# Patient Record
Sex: Male | Born: 1935 | Race: White | Hispanic: No | Marital: Married | Smoking: Former smoker
Health system: Southern US, Community
[De-identification: ages and names within clinical notes are randomized; demographics above are authoritative.]

## PROBLEM LIST (undated history)

## (undated) DIAGNOSIS — I10 Essential (primary) hypertension: Secondary | ICD-10-CM

## (undated) DIAGNOSIS — I2510 Atherosclerotic heart disease of native coronary artery without angina pectoris: Secondary | ICD-10-CM

## (undated) DIAGNOSIS — R0600 Dyspnea, unspecified: Secondary | ICD-10-CM

## (undated) DIAGNOSIS — K219 Gastro-esophageal reflux disease without esophagitis: Secondary | ICD-10-CM

## (undated) DIAGNOSIS — T451X5A Adverse effect of antineoplastic and immunosuppressive drugs, initial encounter: Secondary | ICD-10-CM

## (undated) DIAGNOSIS — R918 Other nonspecific abnormal finding of lung field: Secondary | ICD-10-CM

## (undated) DIAGNOSIS — I739 Peripheral vascular disease, unspecified: Secondary | ICD-10-CM

## (undated) DIAGNOSIS — Z950 Presence of cardiac pacemaker: Secondary | ICD-10-CM

## (undated) DIAGNOSIS — E785 Hyperlipidemia, unspecified: Secondary | ICD-10-CM

## (undated) DIAGNOSIS — F0390 Unspecified dementia without behavioral disturbance: Secondary | ICD-10-CM

## (undated) DIAGNOSIS — N182 Chronic kidney disease, stage 2 (mild): Secondary | ICD-10-CM

## (undated) DIAGNOSIS — I219 Acute myocardial infarction, unspecified: Secondary | ICD-10-CM

## (undated) DIAGNOSIS — M4800 Spinal stenosis, site unspecified: Secondary | ICD-10-CM

## (undated) DIAGNOSIS — I447 Left bundle-branch block, unspecified: Secondary | ICD-10-CM

## (undated) DIAGNOSIS — N400 Enlarged prostate without lower urinary tract symptoms: Secondary | ICD-10-CM

## (undated) DIAGNOSIS — I779 Disorder of arteries and arterioles, unspecified: Secondary | ICD-10-CM

## (undated) DIAGNOSIS — IMO0002 Reserved for concepts with insufficient information to code with codable children: Secondary | ICD-10-CM

## (undated) DIAGNOSIS — Z95810 Presence of automatic (implantable) cardiac defibrillator: Secondary | ICD-10-CM

## (undated) DIAGNOSIS — R112 Nausea with vomiting, unspecified: Secondary | ICD-10-CM

## (undated) DIAGNOSIS — C8510 Unspecified B-cell lymphoma, unspecified site: Secondary | ICD-10-CM

## (undated) DIAGNOSIS — I255 Ischemic cardiomyopathy: Secondary | ICD-10-CM

## (undated) DIAGNOSIS — I639 Cerebral infarction, unspecified: Secondary | ICD-10-CM

## (undated) DIAGNOSIS — G4733 Obstructive sleep apnea (adult) (pediatric): Secondary | ICD-10-CM

## (undated) DIAGNOSIS — I5022 Chronic systolic (congestive) heart failure: Secondary | ICD-10-CM

## (undated) DIAGNOSIS — K5790 Diverticulosis of intestine, part unspecified, without perforation or abscess without bleeding: Secondary | ICD-10-CM

## (undated) HISTORY — DX: Left bundle-branch block, unspecified: I44.7

## (undated) HISTORY — DX: Disorder of arteries and arterioles, unspecified: I77.9

## (undated) HISTORY — DX: Diverticulosis of intestine, part unspecified, without perforation or abscess without bleeding: K57.90

## (undated) HISTORY — PX: CHOLECYSTECTOMY: SHX55

## (undated) HISTORY — DX: Reserved for concepts with insufficient information to code with codable children: IMO0002

## (undated) HISTORY — DX: Chronic kidney disease, stage 2 (mild): N18.2

## (undated) HISTORY — DX: Peripheral vascular disease, unspecified: I73.9

## (undated) HISTORY — DX: Ischemic cardiomyopathy: I25.5

## (undated) HISTORY — DX: Nausea with vomiting, unspecified: R11.2

## (undated) HISTORY — PX: CARDIAC CATHETERIZATION: SHX172

## (undated) HISTORY — DX: Adverse effect of antineoplastic and immunosuppressive drugs, initial encounter: T45.1X5A

## (undated) HISTORY — DX: Benign prostatic hyperplasia without lower urinary tract symptoms: N40.0

## (undated) HISTORY — DX: Obstructive sleep apnea (adult) (pediatric): G47.33

## (undated) HISTORY — DX: Unspecified B-cell lymphoma, unspecified site: C85.10

## (undated) HISTORY — PX: CAROTID ENDARTERECTOMY: SUR193

## (undated) HISTORY — PX: URETHRAL STRICTURE DILATATION: SHX477

## (undated) HISTORY — PX: TONSILLECTOMY: SUR1361

## (undated) HISTORY — DX: Chronic systolic (congestive) heart failure: I50.22

---

## 1962-04-11 HISTORY — PX: APPENDECTOMY: SHX54

## 1990-04-11 HISTORY — PX: AORTA - BILATERAL FEMORAL ARTERY BYPASS GRAFT: SHX1175

## 1998-11-19 ENCOUNTER — Encounter (INDEPENDENT_AMBULATORY_CARE_PROVIDER_SITE_OTHER): Payer: Self-pay | Admitting: Specialist

## 1998-11-19 ENCOUNTER — Ambulatory Visit (HOSPITAL_COMMUNITY): Admission: RE | Admit: 1998-11-19 | Discharge: 1998-11-19 | Payer: Self-pay | Admitting: Gastroenterology

## 1999-01-21 ENCOUNTER — Encounter (INDEPENDENT_AMBULATORY_CARE_PROVIDER_SITE_OTHER): Payer: Self-pay | Admitting: Specialist

## 1999-01-21 ENCOUNTER — Other Ambulatory Visit: Admission: RE | Admit: 1999-01-21 | Discharge: 1999-01-21 | Payer: Self-pay | Admitting: Gastroenterology

## 2000-10-03 ENCOUNTER — Encounter: Payer: Self-pay | Admitting: Gastroenterology

## 2000-10-03 ENCOUNTER — Encounter: Admission: RE | Admit: 2000-10-03 | Discharge: 2000-10-03 | Payer: Self-pay | Admitting: Gastroenterology

## 2004-11-03 ENCOUNTER — Ambulatory Visit (HOSPITAL_COMMUNITY): Admission: RE | Admit: 2004-11-03 | Discharge: 2004-11-03 | Payer: Self-pay | Admitting: Vascular Surgery

## 2004-11-10 ENCOUNTER — Encounter (INDEPENDENT_AMBULATORY_CARE_PROVIDER_SITE_OTHER): Payer: Self-pay | Admitting: *Deleted

## 2004-11-10 ENCOUNTER — Inpatient Hospital Stay (HOSPITAL_COMMUNITY): Admission: RE | Admit: 2004-11-10 | Discharge: 2004-11-12 | Payer: Self-pay | Admitting: Vascular Surgery

## 2005-04-11 HISTORY — PX: COLON SURGERY: SHX602

## 2005-04-11 HISTORY — PX: CORONARY ARTERY BYPASS GRAFT: SHX141

## 2005-08-15 ENCOUNTER — Ambulatory Visit: Payer: Self-pay | Admitting: Gastroenterology

## 2005-08-26 ENCOUNTER — Ambulatory Visit: Payer: Self-pay | Admitting: Gastroenterology

## 2005-09-07 ENCOUNTER — Ambulatory Visit: Payer: Self-pay | Admitting: Gastroenterology

## 2005-11-21 ENCOUNTER — Ambulatory Visit: Payer: Self-pay | Admitting: Gastroenterology

## 2005-12-16 ENCOUNTER — Ambulatory Visit: Payer: Self-pay | Admitting: Gastroenterology

## 2006-04-11 HISTORY — PX: VENTRAL HERNIA REPAIR: SHX424

## 2006-04-11 HISTORY — PX: SKIN GRAFT: SHX250

## 2006-06-29 ENCOUNTER — Ambulatory Visit: Payer: Self-pay | Admitting: Vascular Surgery

## 2007-08-28 ENCOUNTER — Ambulatory Visit: Payer: Self-pay | Admitting: Vascular Surgery

## 2008-06-04 ENCOUNTER — Encounter (INDEPENDENT_AMBULATORY_CARE_PROVIDER_SITE_OTHER): Payer: Self-pay | Admitting: *Deleted

## 2008-06-26 ENCOUNTER — Ambulatory Visit: Payer: Self-pay | Admitting: Gastroenterology

## 2008-07-01 ENCOUNTER — Ambulatory Visit: Payer: Self-pay | Admitting: Gastroenterology

## 2008-07-01 ENCOUNTER — Encounter: Payer: Self-pay | Admitting: Gastroenterology

## 2008-07-03 ENCOUNTER — Encounter: Payer: Self-pay | Admitting: Gastroenterology

## 2008-08-27 ENCOUNTER — Ambulatory Visit: Payer: Self-pay | Admitting: Internal Medicine

## 2009-07-28 ENCOUNTER — Ambulatory Visit: Payer: Self-pay | Admitting: Vascular Surgery

## 2009-08-09 HISTORY — PX: URETHRAL MEATOPLASTY: SHX2620

## 2009-08-31 ENCOUNTER — Encounter: Payer: Self-pay | Admitting: Urology

## 2009-09-01 ENCOUNTER — Ambulatory Visit (HOSPITAL_COMMUNITY): Admission: AD | Admit: 2009-09-01 | Discharge: 2009-09-03 | Payer: Self-pay | Admitting: Urology

## 2010-02-01 ENCOUNTER — Ambulatory Visit (HOSPITAL_BASED_OUTPATIENT_CLINIC_OR_DEPARTMENT_OTHER): Admission: RE | Admit: 2010-02-01 | Discharge: 2010-02-01 | Payer: Self-pay | Admitting: Urology

## 2010-05-02 ENCOUNTER — Encounter: Payer: Self-pay | Admitting: Urology

## 2010-06-23 LAB — POCT I-STAT 4, (NA,K, GLUC, HGB,HCT)
Glucose, Bld: 121 mg/dL — ABNORMAL HIGH (ref 70–99)
HCT: 43 % (ref 39.0–52.0)
Hemoglobin: 14.6 g/dL (ref 13.0–17.0)
Potassium: 4.6 mEq/L (ref 3.5–5.1)
Sodium: 137 mEq/L (ref 135–145)

## 2010-06-28 LAB — POCT I-STAT, CHEM 8
Creatinine, Ser: 0.6 mg/dL (ref 0.4–1.5)
Glucose, Bld: 122 mg/dL — ABNORMAL HIGH (ref 70–99)
HCT: 45 % (ref 39.0–52.0)
Hemoglobin: 15.3 g/dL (ref 13.0–17.0)
Potassium: 4.4 mEq/L (ref 3.5–5.1)
Sodium: 141 mEq/L (ref 135–145)
TCO2: 27 mmol/L (ref 0–100)

## 2010-07-29 ENCOUNTER — Other Ambulatory Visit (INDEPENDENT_AMBULATORY_CARE_PROVIDER_SITE_OTHER): Payer: Medicare Other

## 2010-07-29 DIAGNOSIS — Z48812 Encounter for surgical aftercare following surgery on the circulatory system: Secondary | ICD-10-CM

## 2010-07-29 DIAGNOSIS — I6529 Occlusion and stenosis of unspecified carotid artery: Secondary | ICD-10-CM

## 2010-08-03 NOTE — Procedures (Unsigned)
CAROTID DUPLEX EXAM  INDICATION:  Follow up carotid stenosis.  HISTORY: Diabetes:  No Cardiac:  CABG, MI in 2007 Hypertension:  Yes Smoking:  Previous Previous Surgery:  Left CEA in 1992, redo left CEA 11/10/2004 CV History:  Asymptomatic Amaurosis Fugax No, Paresthesias No, Hemiparesis No                                      RIGHT             LEFT Brachial systolic pressure:         90                112 Brachial Doppler waveforms:         WNL               WNL Vertebral direction of flow:        antegrade         antegrade DUPLEX VELOCITIES (cm/sec) CCA peak systolic                   96                87 ECA peak systolic                   257               144 ICA peak systolic                   184               136 ICA end diastolic                   49                35 PLAQUE MORPHOLOGY:                  heterogeneous     heterogeneous PLAQUE AMOUNT:                      moderate          mild PLAQUE LOCATION:                    CCA, ICA, ECA     CCA, ECA  IMPRESSION: 1. Right internal carotid artery stenosis in the 40% to 59% range. 2. Left internal carotid artery patent with history of endarterectomy,     elevated velocities present which may be consistent with an     endarterectomized vessel. 3. Right external carotid artery stenosis is present. 4. Bilateral vertebral arteries are antegrade. 5. Essentially unchanged since previous study on 07/28/2009.  ___________________________________________ Gary Ferrell. Hart Rochester, M.D.  SH/MEDQ  D:  07/29/2010  T:  07/29/2010  Job:  [id number]

## 2010-08-24 NOTE — Procedures (Signed)
CAROTID DUPLEX EXAM   INDICATION:  Follow up known carotid artery disease.   HISTORY:  Diabetes:  No.  Cardiac:  CABG.  Hypertension:  Yes.  Smoking:  Quit about 9 years ago.  Previous Surgery:  Left carotid endarterectomy 2 times.  The recent 1  was in 2006.  CV History:  Amaurosis Fugax No, Paresthesias No, Hemiparesis No.                                       RIGHT             LEFT  Brachial systolic pressure:         123               127  Brachial Doppler waveforms:         Biphasic          Biphasic  Vertebral direction of flow:        Antegrade         Antegrade  DUPLEX VELOCITIES (cm/sec)  CCA peak systolic                   95                82  ECA peak systolic                   183               116  ICA peak systolic                   175               100  ICA end diastolic                   45                25  PLAQUE MORPHOLOGY:                  Heterogenous      Heterogenous  PLAQUE AMOUNT:                      Moderate          Mild  PLAQUE LOCATION:                    ICA, ECA          ICA, ECA   IMPRESSION:  1. 40% to 59% stenosis noted in the right internal carotid artery.  2. 1% to 39% stenosis noted in the left internal carotid artery.  3. Status post left carotid endarterectomy.  4. Antegrade bilateral vertebral arteries.   ___________________________________________  Quita Skye Hart Rochester, M.D.   MG/MEDQ  D:  07/28/2009  T:  07/28/2009  Job:  [id number]

## 2010-08-24 NOTE — Consult Note (Signed)
NEW PATIENT CONSULTATION   Gary, Ferrell  DOB:  03/12/1936                                       07/28/2009  TGGYI#:[id number]   The patient was referred by Dr. Patsi Sears for preoperative clearance.  He is a 75 year old male known to me from previous vascular surgery in  the past.  He underwent aortobifemoral bypass grafting by me in 1992 for  severe claudication symptoms which have not recurred.  He also had left  carotid endarterectomy in 1992 and a redo left carotid endarterectomy in  2006 with severe carotid occlusive disease which is asymptomatic.  He  has not been back to our office for followup carotid duplex since 2009  and there was no evidence of any significant restenosis.  He denies any  hemispheric or nonhemispheric TIAs, amaurosis fugax, diplopia, blurred  vision or syncope at the present time.  He needs a prostatectomy.  He  has recently been seen by Dr. Wynona Neat, his cardiologist, in [place].   CHRONIC MEDICAL PROBLEMS:  1. Coronary artery disease.  2. Aortoiliac occlusive disease.  3. Hyperlipidemia.  4. Gastroesophageal reflux disease.  5. Negative for diabetes, COPD or stroke.   FAMILY HISTORY:  Positive for coronary artery disease.  Has had PTCA and  stenting on three occasions and positive for diabetes in two brothers.   SOCIAL HISTORY:  He is married, has one child and is retired.  Has not  smoked in 21 years and does not use alcohol.   REVIEW OF SYSTEMS:  Negative for weight loss, anorexia, chest pain,  dyspnea on exertion.  No chronic bronchitis, asthma, wheezing.  Does  have constipation and urinary frequency.  No muscle or joint pain.  No  claudication.  Has occasional change in his hearing.  All other systems  in the review of systems are negative.   PHYSICAL EXAMINATION:  Vital signs:  Blood pressure 123/70, heart rate  58, respirations 14.  General:  He is a well-developed, well-nourished  male who is in no apparent  distress.  HEENT:  Exam is normal.  EOMs  intact.  Lungs:  Are clear.  No rales or wheezing.  Cardiovascular:  Regular rhythm and no murmurs.  Carotid pulses are 3+, no bruits.  Abdomen:  Soft, nontender with no masses.  Musculoskeletal:  Free of  major deformities.  Neurological:  Normal.  Skin:  Free of rashes.  Lower extremity exam reveals 3+ femoral, 2+ popliteal pulses  bilaterally.  Left foot has a PT pulse at 2+, right foot is not  palpable.  Both feet are well-perfused.   Today I ordered a carotid duplex exam since he did have a restenosis in  2006 from an original endarterectomy in 1992.  I reviewed and  interpreted this and he does have mild flow reduction in his right  internal carotid approximating 50% and the left side is minimal in the  30%-40% range.   He has no evidence of severe lower extremity occlusive disease and his  carotid surgery is doing well.  I see no reason he could not proceed  with prostatectomy.  We will follow him on an annual basis for his  carotid disease unless he develops symptoms in the interim.     Quita Skye Hart Rochester, M.D.  Electronically Signed   JDL/MEDQ  D:  07/28/2009  T:  07/29/2009  Job:  [id number] 

## 2010-08-27 NOTE — Assessment & Plan Note (Signed)
[facility]                           GASTROENTEROLOGY OFFICE NOTE   MIKAH, POSS                      MRN:          [id number]  DATE:11/21/2005                            DOB:          1935-12-01    HISTORY:  Marcelino comes in to see me after he had been recently operated on  for intestinal adhesions and lysing adhesions releasing small intestine.  Edras has known dermatitis, __________ and irritable bowel syndrome and  GERD.  He is status post cholecystectomy with known history of Crohn's, and  polyps as well.  He has known gluten neuropathy.  He has had a long history  of diarrhea and bloating and gas.  He says he has had constant diarrhea  since the surgery but actually there have been days when he did not have  much diarrhea and then he had some increased bowel activity following and so  on.  He was tested there at the hospital where he had his surgery for C.  diff and so on and nothing of significance was noted.  He was also given a  course of Flagyl without much success.  He said recent laboratory studies  were not really revealing.   PHYSICAL EXAMINATION:  VITAL SIGNS:  He has lost some weight, and he is down  to 150.6.  His blood pressure is 122/54, pulse is 68 and regular.  GENERAL:  He is white, pale, thin.  HEENT:  His oropharynx is negative.  NECK:  Negative.  HEART:  Revealed a regular rhythm without significant murmur.  CHEST:  Clear to auscultation.  ABDOMEN:  Soft.  There were no significant blood.  There were no bruits,  rubs.  It was nontender.  EXTREMITIES:  Unremarkable.  RECTAL:  Deferred.   IMPRESSION:  Irritable bowel syndrome with diarrheal component.   RECOMMENDATION:  1. He is to change his diet to a somewhat lactose free diet.  He is to      continue on his gluten free diet.  Eat six small meals daily.  2. Use Boost that is lactose free.  3. I explained to him exactly how to take this to sip it slowly over  an      hour and a half with each.  4. He needs to be on multivitamin.  I gave him some Xifaxan 300 mg to take      1 b.i.d., some Flora-Q b.i.d.  5. Lomotil, I told him he could use up to 6 a day.  I gave him a      prescription for this.  6. Ultrase to take 20 mg a.c.  7. Cholestin 2 tablets every day.   Hopefully, the above measures will be helpful to him.  I did get some labs  on him and I will see him back in 3 weeks because I am concerned about  his  overall problem.                                   Ulyess Mort,  MD   SML/MedQ  DD:  11/21/2005  DT:  11/22/2005  Job #:  [id number]

## 2010-08-27 NOTE — Consult Note (Signed)
NAMEIVIS, HENNEMAN NO.:  [id number]   MEDICAL RECORD NO.:  [id number]          PATIENT TYPE:  OIB   LOCATION:  2899                         FACILITY:  [facility]   PHYSICIAN:  Lacy Duverney, M.D.     DATE OF BIRTH:  June 17, 1935   DATE OF CONSULTATION:  11/03/2004  DATE OF DISCHARGE:                                   CONSULTATION   REASON FOR CONSULTATION:  Request by Dr. Hart Rochester for interpretation of  intracranial views from arteriogram he performed November 03, 2004.  Patient  with findings suggestive of recurrent carotid stenosis.   Right common carotid arteriogram intracranial views (AP and lateral):   Ectasia of portions of the right internal carotid artery cavernous segment  alternating with areas of mild narrowing.  This is most notable along the  superior aspect of the right internal carotid artery where on the lateral  view there is a bilobed bulbous projection inferiorly suggestive of mild  aneurysmal dilatation.  Evaluation somewhat limited by lack of oblique  views.  Stability can be confirmed on follow up.  With injection of the  right common carotid artery, no significant cross-filling occurs.  There is  mild narrowing of the proximal right middle cerebral artery inferior branch.  Call is into Dr. Hart Rochester.   Left common carotid arteriogram intracranial views (AP and lateral):   With injection of the left common carotid artery there is flash filling of  right anterior cerebral artery AZ segment vessels.  No high-grade stenosis  detected.   IMPRESSION:  Moderate atherosclerotic type changes, most notable right  internal carotid artery cavernous segment when there is aneurysmal dilation  as described above.           ______________________________  Lacy Duverney, M.D.     SO/MEDQ  D:  11/03/2004  T:  11/03/2004  Job:  [id number]   cc:   Mirian Mo, M.D.

## 2010-08-27 NOTE — Procedures (Signed)
CAROTID DUPLEX EXAM   INDICATION:  Followup of known carotid artery disease.  Patient is  currently asymptomatic.   HISTORY:  Diabetes:  No.  Cardiac:  CAD, CABG in 2007.  Hypertension:  Yes.  Smoking:  No.  Previous Surgery:  CABG and redo of left CEA with DPA on 11/10/04 by Dr.  Hart Rochester.  CV History:  Amaurosis Fugax , Paresthesias , Hemiparesis                                       RIGHT             LEFT  Brachial systolic pressure:         138               140  Brachial Doppler waveforms:         WNL               WNL  Vertebral direction of flow:        Antegrade         Antegrade  DUPLEX VELOCITIES (cm/sec)  CCA peak systolic                   84                94  ECA peak systolic                   156               157  ICA peak systolic                   137               125  ICA end diastolic                   38                34  PLAQUE MORPHOLOGY:                  Mixed             Mixed  PLAQUE AMOUNT:                      Moderate          Moderate  PLAQUE LOCATION:                    ICA, ECA          ICA, ECA   IMPRESSION:  1. Right 40-59% internal carotid artery stenosis.  2. Left 40-59% internal carotid artery stenosis, status post carotid      endarterectomy.  3. Bilateral external carotid artery stenoses.  4. Bilateral antegrade flow in vertebral arteries.  5. Study essentially unchanged from that on 06/29/06.   ___________________________________________  Quita Skye. Hart Rochester, M.D.   PB/MEDQ  D:  08/29/2007  T:  08/29/2007  Job:  [id number]

## 2010-08-27 NOTE — Assessment & Plan Note (Signed)
[facility]                           GASTROENTEROLOGY OFFICE NOTE   Gary Ferrell, Gary Ferrell                      MRN:          [id number]  DATE:12/16/2005                            DOB:          [birth year]    Sie comes in stating he still feels weak and tired and has some slight  diarrhea; although, when he describes it, he says it is quite intermittent.  Some days he has normal bowel activity and other days he has a normal bowel  movement with a lot of fluid afterward.  I told him that I really did not  think that this sounds like an infectious process; and that, since he did  not take Colestid, we should try some Questran packets and see if this will  give him any relief to control this even further.  I told him to try some  Lomotil.  I gave him some Align and told him to try to supplement his eating  with Boost and so on as he has in the past.  He tried the lactose free diet;  this really did not make much difference.  He uses the Ultrase.   His present medications include furosemide which he would like to get off  of, baby aspirin, doxazosin, Toprol-XL, B Complex, fish oil, palmetto,  dipyridamole, Lomotil, and some iron because when we did his blood test, his  iron level was pretty low.  He takes Vytorin. I told him to return in the  next 6-12 weeks.  If his symptoms do not improve, let us known.                                   Ulyess Mort, MD   SML/MedQ  DD:  12/16/2005  DT:  12/17/2005  Job #:  [id number]

## 2010-08-27 NOTE — Op Note (Signed)
NAMETERRILL, ALPERIN NO.:  [id number]   MEDICAL RECORD NO.:  [id number]          PATIENT TYPE:  OIB   LOCATION:  2899                         FACILITY:  [facility]   PHYSICIAN:  Quita Skye. Hart Rochester, M.D.  DATE OF BIRTH:  20-Aug-1935   DATE OF PROCEDURE:  11/03/2004  DATE OF DISCHARGE:                                 OPERATIVE REPORT   PREOPERATIVE DIAGNOSIS:  Recurrent severe left internal carotid stenosis.   PROCEDURE:  1.  Arch aortogram via right common femoral approach.  2.  Selective catheterization, right common carotid artery with a biplane      right carotid angiogram with intracerebral views.  3.  Selective catheterization, left common carotid artery with biplane left      carotid angiogram with intracerebral views.   SURGEON:  Dr. Hart Rochester.   ANESTHESIA:  Local Xylocaine.   CONTRAST:  103 cc.   COMPLICATIONS:  None.   ESTIMATED BLOOD LOSS:  20 mL.   DESCRIPTION OF PROCEDURE:  Patient taken to the [facility] peripheral  endovascular lab, placed in supine position at which time right groin was  prepped Betadine solution, draped in routine sterile manner. After  infiltration of 1% Xylocaine, the right common femoral artery was entered.  5 French sheath and dilator passed over a Rosen wire.  Dilator removed and a  standard pigtail catheter positioned in the aortic arch. Arch aortogram was  then performed injecting 40 mL at 20 mL per second was injected. This  revealed the arch anatomy to be normal with widely patent innominate, right  subclavian, right common carotid artery. Left common arose from the arch and  was widely patent as was the left subclavian artery. There were bilateral  patent vertebral arteries of similar size which filled antegrade with no  significant stenoses noted. The pigtail catheter was then exchanged for a H1  headhunter catheter and the right common carotid artery was selectively  cannulated and biplane carotid angiograms in several  views were obtained  injecting 5 mL of contrast on each occasion. This revealed very mild plaque  formation at the right carotid bifurcation with the minimal stenosis of the  internal carotid approximating 30% with a widely patent right external  carotid. No other lesions were noted and the intracerebral views will be  interpreted by the radiologist. The H1 catheter was then exchanged for a  Berenstein 1 catheter and the left common carotid artery was selectively  cannulated and biplane left common carotid angiogram with intracerebral  views was performed similar contrast injections. This revealed the left  common carotid artery to be widely patent to the bifurcation. There was a  focal plaque dissection at the origin of the left internal carotid artery  with a focal 80-85% stenosis. The distal vessel was large and with no areas  of further stenosis and the external carotid was widely patent. Having  tolerated procedure well. The catheter was removed over the guidewire, the  sheath removed, adequate compression applied. No complications ensued.   FINDINGS:  1.  Normal arch anatomy with no stenoses noted in the major vessels from  the      arch.  2.  Minimal plaque formation at the right carotid bifurcation with      approximate 30% stenosis.  3.  Focal 80-85% recurrent stenosis with plaque dissection at the origin of      the left internal carotid artery.       JDL/MEDQ  D:  11/03/2004  T:  11/03/2004  Job:  [id number]

## 2010-08-27 NOTE — Discharge Summary (Signed)
NAMEJAPHET, MORGENTHALER NO.:  [id number]   MEDICAL RECORD NO.:  [id number]          PATIENT TYPE:  INP   LOCATION:  2032                         FACILITY:  [facility]   PHYSICIAN:  Quita Skye. Hart Rochester, M.D.  DATE OF BIRTH:  1935-11-17   DATE OF ADMISSION:  11/10/2004  DATE OF DISCHARGE:  11/12/2004                                 DISCHARGE SUMMARY   PRIMARY ADMITTING DIAGNOSIS:  Recurrent left carotid stenosis.   ADDITIONAL/DISCHARGE DIAGNOSES:  1.  Status post previous left carotid endarterectomy.  2.  History of hypertension.  3.  Hyperlipidemia.  4.  Peripheral vascular occlusive disease, status post aortobifemoral bypass      graft.  5.  History of esophageal stricture.  6.  Benign prostatic hypertrophy.  7.  Urinary retention.   PROCEDURE PERFORMED:  Redo left carotid endarterectomy with Dacron patch  angioplasty.   HISTORY:  The patient is a 75 year old male well-known to [facility], who has  previously undergone a left carotid endarterectomy in early 1990s.  He has  been followed by surveillance duplex exams since that time and recently was  noted to have moderate severe restenosis on the left, around 80%.  The right  side also shows a 50-60% stenosis.  Because of this recurrence, he underwent  an arteriogram on November 03, 2004, and this confirmed an 80-85% recurrent left  internal carotid artery stenosis.  The patient has been asymptomatic, but it  was felt by Dr. Hart Rochester that he would require a redo left that left carotid  endarterectomy at this time in order to decrease his risk of stroke.  He  explained the risks, benefits and alternatives of the procedure, and the  patient agreed to proceed.   HOSPITAL COURSE:  Mr. Abruzzo was admitted to [facility]  on November 10, 2004,  and underwent redo left carotid endarterectomy by Dr. Hart Rochester.  He tolerated  procedure well and was transferred to unit 3300 in stable condition.  Postoperatively he has done very well.  His only  difficulty has been urinary  retention secondary to a history of benign prostatic hypertrophy.  He  required reinsertion of a Foley catheter on postop day #1, and he was  restarted on his home dose of Cardura.  By postop day #2 his catheter was  able to be removed and he was voiding without difficulty.  Otherwise, he has  done very well.  He has remained neurologically intact.  His surgical  incision site is healing well.  He has been ambulating the halls without  difficulty and is tolerating a regular diet.   His labs showed a hemoglobin of 13.6, hematocrit 39.9, white count 6.6,  platelets 147.  Sodium 133, potassium 4.1, BUN 9, creatinine 0.7.  He was  felt that time since he is doing well, he may be discharged home.   Discharge medications are as follows:  1.  Enteric-coated aspirin 81 mg q.d.  2.  Sulfapyridine 500 mg q.d.  3.  Enalapril 5 mg q.d.  4.  Doxazosin 4 mg q.d.  5.  Tylox one to two q.4 h p.r.n.  for pain.   DISCHARGE INSTRUCTIONS:  He is asked to refrain from driving, heavy lifting  or strenuous activity.  He may continue ambulating daily and using his  incentive spirometer.  He is asked to continue the same preoperative diet.  He may shower daily and clean his incisions with soap and water.   DISCHARGE FOLLOW-UP:  He will see Dr. Hart Rochester back in the office in three  weeks.  He was asked to contact our office if he experiences any problems in  the interim.      Gi   GC/MEDQ  D:  11/12/2004  T:  11/13/2004  Job:  [id number]   cc:   Dr. Nelda Bucks, [place]

## 2010-08-27 NOTE — Op Note (Signed)
Gary Ferrell, Gary Ferrell NO.:  [id number]   MEDICAL RECORD NO.:  [id number]          PATIENT TYPE:  INP   LOCATION:  2899                         FACILITY:  [facility]   PHYSICIAN:  Quita Skye. Hart Rochester, M.D.  DATE OF BIRTH:  10/01/1935   DATE OF PROCEDURE:  11/10/2004  DATE OF DISCHARGE:                                 OPERATIVE REPORT   PREOP DIAGNOSIS:  Recurrent severe left internal carotid stenosis -  asymptomatic.   POSTOP DIAGNOSIS:  Recurrent severe left internal carotid stenosis -  asymptomatic.   OPERATIONS:  A redo left carotid endarterectomy with Dacron patch  angioplasty.   SURGEON:  Dr. Hart Rochester.   FIRST ASSISTANT:  Ediberto Sens. Fields, MD.   SECOND ASSISTANT:  Pecola Leisure, PA   ANESTHESIA:  General endotracheal.   BRIEF HISTORY:  This patient underwent left a carotid endarterectomy in 1992  and has remained asymptomatic over the years, but has developed recurrent  stenosis which has been followed by duplex scanning. This has progressed to  an 80% degree of severity and an angiogram was confirmed this and revealed a  localized dissection at the carotid bifurcation of this internal carotid  artery plaque. He is scheduled for redo left carotid endarterectomy. He has  very mild disease on the contralateral right side.   PROCEDURE:  The patient was taken to the operating room and placed in supine  position at which time satisfactory general endotracheal anesthesia was  administered. Left neck was prepped Betadine scrub and solution and draped  in routine sterile manner. Incision was made through the previous scar in  the left neck along the anterior border sternocleidomastoid muscle, carried  down through subcutaneous tissue and platysma using the Bovie. The common  carotid artery was dissected free low in the neck, encircled with umbilical  tape. It was then dissected carefully up the bifurcation and the internal  and external carotid arteries dissected  free using Metzenbaum scissors and a  15 blade. There was a Dacron patch placed from the common carotid up to the  3 cm above the carotid bifurcation and the distal internal carotid artery  was encircled with umbilical tape. Care was taken not to injure the vagus or  hypoglossal nerves. The patient was then heparinized. Carotid vessels  occluded with vascular clamps. Longitudinal opening made through the patch  with a 15 blade. This was somewhat difficult because of severe calcification  all along the inside of the patch. Arteriotomy was extended proximal and  distal to the previous patch and there was a severe dissection which was  localized at the carotid bifurcation with a flap in the proximal internal  carotid artery causing the stenosis, but the distal vessel appeared normal.  #10 shunt was inserted without difficulty reestablishing flow in about two  minutes. A review endarterectomy was then performed down to the deep plane  and the previous patch was completely excised from the artery. All loose  debris was carefully removed the artery and a new patch was sewn in place  with 6-0 Prolene. Prior to completion of the closure, appropriate flushing  was performed. Following completion, the clamps were released. There was  excellent pulse and Doppler flow in both the  external and internal carotid arteries. Protamine was given to reverse the  heparin. Following adequate hemostasis, wound was irrigated with saline,  closed in layers with Vicryl in subcuticular fashion. Sterile dressing  applied. The patient taken to recovery room in satisfactory condition.       JDL/MEDQ  D:  11/10/2004  T:  11/10/2004  Job:  [id number]

## 2010-08-27 NOTE — H&P (Signed)
NAMEDELIO, SLATES NO.:  [id number]   MEDICAL RECORD NO.:  [id number]          PATIENT TYPE:  OIB   LOCATION:  2899                         FACILITY:  [facility]   PHYSICIAN:  Pecola Leisure, PA   DATE OF BIRTH:  1935-07-16   DATE OF ADMISSION:  11/03/2004  DATE OF DISCHARGE:                                HISTORY & PHYSICAL   ADMITTING DIAGNOSES:  Recurrent left internal carotid artery stenosis.   HISTORY OF PRESENT ILLNESS:  This is a 75 year old Caucasian male who is  status post left carotid endarterectomy and aortobifemoral bypass graft in  the early 1990s by Dr. Hart Rochester.  The patient has been asymptomatic since that  time with several follow-up duplex examinations.  These revealed moderate  restenosis in 2004 and moderately severe restenosis in 2006.  Dr. Hart Rochester saw  the patient in follow-up several weeks ago and again repeated a carotid  duplex for comparison and this revealed an approximately 80% recurrent left  internal carotid artery stenosis.  The patient has remained asymptomatic.  There is also a right internal carotid artery stenosis of 50-60%.  He  recommended an arteriogram which was then carried out on November 03, 2004 and  revealed an 80-85% recurrent left internal carotid artery stenosis.  Patient  presents for a history and physical examination prior to redo carotid  surgery.  Patient denies headache, nausea, vomiting, vertigo, falls,  seizures, numbness, tingling, TIA, CVA, symptoms of dysphagia, visual  changes, syncope, and memory loss.   PAST MEDICAL HISTORY:  1.  Hypertension.  2.  Hyperlipidemia.  3.  Extracranial cerebrovascular occlusive disease.  4.  Peripheral vascular occlusive disease.  5.  Esophageal strictures.  6.  Dermatitis.   PAST SURGICAL HISTORY:  1.  Left carotid endarterectomy.  2.  Aortobifemoral bypass grafting.  3.  Cholecystectomy.  4.  Appendectomy.  5.  Esophageal dilatation.   ALLERGIES:  DAPSONE which  causes peripheral neuropathy.   MEDICATIONS:  1.  Sulfapyridine 500 mg daily.  2.  Aspirin 81 mg daily.  3.  Enalapril 5 mg daily.  4.  Doxazosin 4 mg daily.  5.  Gingko biloba daily.  6.  Saw palmetto daily.  7.  Multivitamin daily.   REVIEW OF SYSTEMS:  Please see HPI for significant positives and negatives.  Otherwise negative for coronary artery disease, diabetes mellitus, and  kidney disease.   SOCIAL HISTORY:  This is a married male with one child.  He lives with his  family.  Patient stopped smoking tobacco in 1991 and does not use alcohol.  He is retired and continues to drive.   FAMILY HISTORY:  Noncontributory.   PHYSICAL EXAMINATION:  VITAL SIGNS:  Blood pressure 127/57, heart rate 65,  respirations 16, temperature 98, O2 saturation 97% on room air.  GENERAL:  This is a 75 year old Caucasian male in no acute distress.  HEENT:  Normocephalic, atraumatic.  Pupils are equal, round, and reactive to  light and accommodation.  Extraocular movements are intact.  Oral mucosa is  pink and moist.  Sclerae are nonicteric.  NECK:  Supple with no JVD  and no lymphadenopathy.  The carotids are palpable  and there is a left bruit auscultated.  Respirations are symmetric on  inspiration, unlabored, and clear.  CARDIAC:  Regular rate and rhythm.  No murmurs, rubs, or gallops.  ABDOMEN:  Soft, nontender, nondistended with normoactive bowel sounds.  GENITOURINARY:  Deferred.  RECTAL:  Deferred.  EXTREMITIES:  There is a brown patchy discoloration present on the bilateral  lower extremities from the knee distally from his dermatitis.  The  temperature is warm.  PULSES:  Radial, femoral, popliteal, dorsalis pedis, and posterior tibial  are all 2+ with the exception of the right femoral artery which was not  palpated secondary to angio site.  NEUROLOGIC:  Nonfocal.  Patient is alert and oriented x3.  Gait is  unobserved.  Muscle strength is 5/5 in the upper lower and left lower   extremity.  Right lower extremity, again, is not tested secondary to bed  rest from angiogram.  Muscle strength is symmetric and deep tendon reflexes  are 2+.   ASSESSMENT:  Recurrent left internal carotid artery stenosis.   PLAN:  Redo left carotid endarterectomy by Dr. Hart Rochester on November 10, 2004.  Dr. Hart Rochester has seen and evaluated this patient prior to the procedure and  has explained the risks and benefits of the procedure and the patient has  agreed to continue.       AY/MEDQ  D:  11/03/2004  T:  11/03/2004  Job:  [id number]

## 2010-09-27 ENCOUNTER — Ambulatory Visit (HOSPITAL_BASED_OUTPATIENT_CLINIC_OR_DEPARTMENT_OTHER)
Admission: RE | Admit: 2010-09-27 | Discharge: 2010-09-27 | Disposition: A | Discharge status: Home / Self Care | Payer: Medicare Other | Source: Ambulatory Visit | Attending: Urology | Admitting: Urology

## 2010-09-27 DIAGNOSIS — N400 Enlarged prostate without lower urinary tract symptoms: Secondary | ICD-10-CM | POA: Insufficient documentation

## 2010-09-27 DIAGNOSIS — K219 Gastro-esophageal reflux disease without esophagitis: Secondary | ICD-10-CM | POA: Insufficient documentation

## 2010-09-27 DIAGNOSIS — Z951 Presence of aortocoronary bypass graft: Secondary | ICD-10-CM | POA: Insufficient documentation

## 2010-09-27 DIAGNOSIS — Z01812 Encounter for preprocedural laboratory examination: Secondary | ICD-10-CM | POA: Insufficient documentation

## 2010-09-27 DIAGNOSIS — I10 Essential (primary) hypertension: Secondary | ICD-10-CM | POA: Insufficient documentation

## 2010-09-27 DIAGNOSIS — I2510 Atherosclerotic heart disease of native coronary artery without angina pectoris: Secondary | ICD-10-CM | POA: Insufficient documentation

## 2010-09-27 DIAGNOSIS — N35919 Unspecified urethral stricture, male, unspecified site: Secondary | ICD-10-CM | POA: Insufficient documentation

## 2010-09-27 DIAGNOSIS — I739 Peripheral vascular disease, unspecified: Secondary | ICD-10-CM | POA: Insufficient documentation

## 2010-09-27 DIAGNOSIS — E7800 Pure hypercholesterolemia, unspecified: Secondary | ICD-10-CM | POA: Insufficient documentation

## 2010-09-27 DIAGNOSIS — Z01810 Encounter for preprocedural cardiovascular examination: Secondary | ICD-10-CM | POA: Insufficient documentation

## 2010-09-27 DIAGNOSIS — I252 Old myocardial infarction: Secondary | ICD-10-CM | POA: Insufficient documentation

## 2010-09-27 LAB — POCT I-STAT 4, (NA,K, GLUC, HGB,HCT): Potassium: 4.5 mEq/L (ref 3.5–5.1)

## 2010-10-01 NOTE — Op Note (Addendum)
  Gary Ferrell, PEART NO.:  [id number]  MEDICAL RECORD NO.:  [id number]  LOCATION:  4W                           FACILITY:  [facility]  PHYSICIAN:  Donnalee Cellucci I. Patsi Sears, M.D.DATE OF BIRTH:  02-Mar-1936  DATE OF PROCEDURE:  09/27/2010 DATE OF DISCHARGE:  09/03/2009                              OPERATIVE REPORT   PREOPERATIVE DIAGNOSIS:  Urethral meatal stenosis.  POSTOPERATIVE DIAGNOSIS:  Urethral meatal stenosis.  OPERATIONS:  Cystourethroscopy, urethral dilation of the urethral meatal stenosis.  SURGEON:  Evett Kassa I. Patsi Sears, M.D.  ANESTHESIA:  General LMA.  PREPARATION:  After appropriate preanesthesia, the patient was brought to the operating room, placed on the operating room table in dorsal supine position where general LMA anesthesia was induced.  He was then replaced in dorsal lithotomy position where the pubis was prepped with Betadine solution and draped in usual fashion.  REVIEW OF HISTORY:  Mr. Turnley is a 75 year old male with a history of BPH, meatal stenosis status post dilation in July of 2011.  He has been self dilating at home and has noted blood from the meatus.  He is now for cystoscopy and meatal dilation under general anesthesia.  PROCEDURE:  Cystourethroscopy was accomplished and shows meatal and submeatal stenosis.  This was dilated to a size 32-French with the [employer] sounds.  No bleeding was noted.  Cystoscopy shows completely patent urethra in all segments and also an open bladder neck, with slight contracture, slight regrowth of right-sided prostate tissue.  The patient tolerated the procedure well.  He was given a B and  O suppository, awakened and taken to the recovery room in good condition.     Ab Leaming I. Patsi Sears, M.D.     SIT/MEDQ  D:  09/27/2010  T:  09/27/2010  Job:  [id number]  Electronically Signed by Jethro Bolus M.D. on 10/21/2010 08:40:08 AM

## 2011-07-11 ENCOUNTER — Ambulatory Visit (INDEPENDENT_AMBULATORY_CARE_PROVIDER_SITE_OTHER): Payer: Medicare Other | Admitting: *Deleted

## 2011-07-11 DIAGNOSIS — Z48812 Encounter for surgical aftercare following surgery on the circulatory system: Secondary | ICD-10-CM

## 2011-07-11 DIAGNOSIS — I6529 Occlusion and stenosis of unspecified carotid artery: Secondary | ICD-10-CM

## 2011-07-12 NOTE — Procedures (Unsigned)
CAROTID DUPLEX EXAM  INDICATION:  Followup left CEA  HISTORY: Diabetes:  No Cardiac:  Yes Hypertension:  Yes Smoking:  Previous Previous Surgery:  Left CEA 1992 with revision 11/10/2004 CV History: Amaurosis Fugax No, Paresthesias No, Hemiparesis No                                      RIGHT             LEFT Brachial systolic pressure:         120               120 Brachial Doppler waveforms:         WNL               WNL Vertebral direction of flow:        Abnormal antegrade                  Antegrade DUPLEX VELOCITIES (cm/sec) CCA peak systolic                   107               99 ECA peak systolic                   209               151 ICA peak systolic                   209               141 ICA end diastolic                   41                32 PLAQUE MORPHOLOGY:                  Heterogeneous     Heterogeneous PLAQUE AMOUNT:                      Moderate          Minimal PLAQUE LOCATION:                    CCA/ECA/ICA       CEA/CCA/ECA  IMPRESSION: 1. 40%-59% right internal carotid artery stenosis. 2. Widely patent left carotid endarterectomy with apparently normal     post surgical changes. 3. Focal ectasia of the right subclavian artery measuring     approximately 1.3 cm of the proximal segment with mild stenosis     observed. 4. Abnormal antegrade right vertebral artery. 5. Left subclavian artery and vertebral arteries appear within normal     limits.  ___________________________________________ Quita Skye Hart Rochester, M.D.  LT/MEDQ  D:  07/11/2011  T:  07/11/2011  Job:  [id number]

## 2011-07-27 ENCOUNTER — Encounter: Payer: Self-pay | Admitting: Vascular Surgery

## 2011-08-02 ENCOUNTER — Other Ambulatory Visit: Payer: Self-pay | Admitting: *Deleted

## 2011-08-02 DIAGNOSIS — I6529 Occlusion and stenosis of unspecified carotid artery: Secondary | ICD-10-CM

## 2011-08-10 ENCOUNTER — Ambulatory Visit: Payer: Medicare Other | Admitting: Neurosurgery

## 2011-08-10 ENCOUNTER — Other Ambulatory Visit: Payer: Medicare Other

## 2012-06-05 ENCOUNTER — Other Ambulatory Visit: Payer: Self-pay | Admitting: Urology

## 2012-06-15 ENCOUNTER — Encounter (HOSPITAL_COMMUNITY): Payer: Self-pay | Admitting: Pharmacy Technician

## 2012-06-18 ENCOUNTER — Encounter (HOSPITAL_COMMUNITY)
Admission: RE | Admit: 2012-06-18 | Discharge: 2012-06-18 | Disposition: A | Discharge status: Home / Self Care | Payer: Medicare Other | Source: Ambulatory Visit | Attending: Urology | Admitting: Urology

## 2012-06-18 ENCOUNTER — Encounter (HOSPITAL_COMMUNITY): Payer: Self-pay

## 2012-06-18 ENCOUNTER — Ambulatory Visit (HOSPITAL_COMMUNITY)
Admission: RE | Admit: 2012-06-18 | Discharge: 2012-06-18 | Disposition: A | Discharge status: Home / Self Care | Payer: Medicare Other | Source: Ambulatory Visit | Attending: Urology | Admitting: Urology

## 2012-06-18 DIAGNOSIS — Z01812 Encounter for preprocedural laboratory examination: Secondary | ICD-10-CM | POA: Insufficient documentation

## 2012-06-18 DIAGNOSIS — Z951 Presence of aortocoronary bypass graft: Secondary | ICD-10-CM | POA: Insufficient documentation

## 2012-06-18 DIAGNOSIS — J984 Other disorders of lung: Secondary | ICD-10-CM | POA: Insufficient documentation

## 2012-06-18 DIAGNOSIS — J9819 Other pulmonary collapse: Secondary | ICD-10-CM | POA: Insufficient documentation

## 2012-06-18 DIAGNOSIS — Z01818 Encounter for other preprocedural examination: Secondary | ICD-10-CM | POA: Insufficient documentation

## 2012-06-18 HISTORY — DX: Cerebral infarction, unspecified: I63.9

## 2012-06-18 HISTORY — DX: Atherosclerotic heart disease of native coronary artery without angina pectoris: I25.10

## 2012-06-18 HISTORY — DX: Essential (primary) hypertension: I10

## 2012-06-18 HISTORY — DX: Gastro-esophageal reflux disease without esophagitis: K21.9

## 2012-06-18 HISTORY — DX: Other nonspecific abnormal finding of lung field: R91.8

## 2012-06-18 HISTORY — DX: Hyperlipidemia, unspecified: E78.5

## 2012-06-18 HISTORY — DX: Acute myocardial infarction, unspecified: I21.9

## 2012-06-18 LAB — CBC
HCT: 41.8 % (ref 39.0–52.0)
MCHC: 33.7 g/dL (ref 30.0–36.0)
RDW: 12.7 % (ref 11.5–15.5)

## 2012-06-18 LAB — BASIC METABOLIC PANEL
BUN: 20 mg/dL (ref 6–23)
Calcium: 9.1 mg/dL (ref 8.4–10.5)
Creatinine, Ser: 0.98 mg/dL (ref 0.50–1.35)
GFR calc Af Amer: >90 mL/min (ref >90)
GFR calc non Af Amer: 79 mL/min — ABNORMAL LOW (ref >90)

## 2012-06-18 LAB — SURGICAL PCR SCREEN: MRSA, PCR: NEGATIVE

## 2012-06-18 NOTE — Progress Notes (Addendum)
06/08/12 Clearance note from Dr Margaretha Glassing  Stress test done 06/09/11 on chart  Last office visit note from Dr Prescott Parma( pulmonary ) on chart - 12/08/11  EKG 06/08/12 on chart  Carotid dopplers 07/11/11 in [facility]

## 2012-06-18 NOTE — Patient Instructions (Signed)
[name]  06/18/2012   Your procedure is scheduled on:  07/02/12   Report to [facility] at    0830  AM.  Call this number if you have problems the morning of surgery: [phone]   Remember:   Do not eat food or drink liquids after midnight.   Take these medicines the morning of surgery with A SIP OF WATER:    Do not wear jewelry,   Do not wear lotions, powders, or perfumes.    Men may shave face and neck.  Do not bring valuables to the hospital.  Contacts, dentures or bridgework may not be worn into surgery.     Patients discharged the day of surgery will not be allowed to drive  home.  Name and phone number of your driver:   SEE CHG INSTRUCTION SHEET    Please read over the following fact sheets that you were given: MRSA Information, coughing and deep breathing exercises, leg exercises               Failure to comply with these instructions may result in cancellation of your surgery.                Patient Signature ____________________________              Nurse Signature _____________________________

## 2012-06-27 ENCOUNTER — Other Ambulatory Visit (INDEPENDENT_AMBULATORY_CARE_PROVIDER_SITE_OTHER): Payer: Medicare Other | Admitting: *Deleted

## 2012-06-27 DIAGNOSIS — I6529 Occlusion and stenosis of unspecified carotid artery: Secondary | ICD-10-CM

## 2012-06-27 DIAGNOSIS — Z48812 Encounter for surgical aftercare following surgery on the circulatory system: Secondary | ICD-10-CM

## 2012-06-28 ENCOUNTER — Other Ambulatory Visit: Payer: Self-pay

## 2012-06-28 DIAGNOSIS — Z48812 Encounter for surgical aftercare following surgery on the circulatory system: Secondary | ICD-10-CM

## 2012-06-29 ENCOUNTER — Encounter: Payer: Self-pay | Admitting: Vascular Surgery

## 2012-06-29 ENCOUNTER — Encounter (HOSPITAL_COMMUNITY): Payer: Self-pay | Admitting: *Deleted

## 2012-06-29 NOTE — Progress Notes (Signed)
Wife called to Presurgical Testing.  Reported that patient had been seen by Dr Karma Ganja at [facility].  MD stated per wife that patient has Spinal Stenosis and anesthesia needs to be careful when positioning his neck for surgery.  Phone number for Dr Karma Ganja [phone].  Pager number- [phone]

## 2012-07-02 ENCOUNTER — Encounter (HOSPITAL_COMMUNITY)
Admission: RE | Disposition: A | Discharge status: Home / Self Care | Payer: Self-pay | Source: Ambulatory Visit | Attending: Urology

## 2012-07-02 ENCOUNTER — Ambulatory Visit (HOSPITAL_COMMUNITY)
Admission: RE | Admit: 2012-07-02 | Discharge: 2012-07-02 | Disposition: A | Discharge status: Home / Self Care | Payer: Medicare Other | Source: Ambulatory Visit | Attending: Urology | Admitting: Urology

## 2012-07-02 ENCOUNTER — Encounter (HOSPITAL_COMMUNITY): Payer: Self-pay | Admitting: *Deleted

## 2012-07-02 ENCOUNTER — Ambulatory Visit (HOSPITAL_COMMUNITY): Payer: Medicare Other | Admitting: Anesthesiology

## 2012-07-02 ENCOUNTER — Encounter (HOSPITAL_COMMUNITY): Payer: Self-pay | Admitting: Anesthesiology

## 2012-07-02 DIAGNOSIS — I129 Hypertensive chronic kidney disease with stage 1 through stage 4 chronic kidney disease, or unspecified chronic kidney disease: Secondary | ICD-10-CM | POA: Insufficient documentation

## 2012-07-02 DIAGNOSIS — N400 Enlarged prostate without lower urinary tract symptoms: Secondary | ICD-10-CM

## 2012-07-02 DIAGNOSIS — K219 Gastro-esophageal reflux disease without esophagitis: Secondary | ICD-10-CM | POA: Insufficient documentation

## 2012-07-02 DIAGNOSIS — E785 Hyperlipidemia, unspecified: Secondary | ICD-10-CM | POA: Insufficient documentation

## 2012-07-02 DIAGNOSIS — I2510 Atherosclerotic heart disease of native coronary artery without angina pectoris: Secondary | ICD-10-CM | POA: Insufficient documentation

## 2012-07-02 DIAGNOSIS — J988 Other specified respiratory disorders: Secondary | ICD-10-CM | POA: Insufficient documentation

## 2012-07-02 DIAGNOSIS — N529 Male erectile dysfunction, unspecified: Secondary | ICD-10-CM | POA: Insufficient documentation

## 2012-07-02 DIAGNOSIS — N138 Other obstructive and reflux uropathy: Secondary | ICD-10-CM | POA: Insufficient documentation

## 2012-07-02 DIAGNOSIS — R350 Frequency of micturition: Secondary | ICD-10-CM | POA: Insufficient documentation

## 2012-07-02 DIAGNOSIS — N401 Enlarged prostate with lower urinary tract symptoms: Secondary | ICD-10-CM | POA: Insufficient documentation

## 2012-07-02 DIAGNOSIS — Z8673 Personal history of transient ischemic attack (TIA), and cerebral infarction without residual deficits: Secondary | ICD-10-CM | POA: Insufficient documentation

## 2012-07-02 DIAGNOSIS — N35919 Unspecified urethral stricture, male, unspecified site: Secondary | ICD-10-CM | POA: Insufficient documentation

## 2012-07-02 DIAGNOSIS — N320 Bladder-neck obstruction: Secondary | ICD-10-CM | POA: Insufficient documentation

## 2012-07-02 DIAGNOSIS — Z79899 Other long term (current) drug therapy: Secondary | ICD-10-CM | POA: Insufficient documentation

## 2012-07-02 DIAGNOSIS — R3915 Urgency of urination: Secondary | ICD-10-CM | POA: Insufficient documentation

## 2012-07-02 DIAGNOSIS — R39198 Other difficulties with micturition: Secondary | ICD-10-CM | POA: Insufficient documentation

## 2012-07-02 DIAGNOSIS — I252 Old myocardial infarction: Secondary | ICD-10-CM | POA: Insufficient documentation

## 2012-07-02 DIAGNOSIS — Z883 Allergy status to other anti-infective agents status: Secondary | ICD-10-CM | POA: Insufficient documentation

## 2012-07-02 DIAGNOSIS — N189 Chronic kidney disease, unspecified: Secondary | ICD-10-CM | POA: Insufficient documentation

## 2012-07-02 DIAGNOSIS — Z87891 Personal history of nicotine dependence: Secondary | ICD-10-CM | POA: Insufficient documentation

## 2012-07-02 HISTORY — DX: Spinal stenosis, site unspecified: M48.00

## 2012-07-02 HISTORY — PX: GREEN LIGHT LASER TURP (TRANSURETHRAL RESECTION OF PROSTATE: SHX6260

## 2012-07-02 SURGERY — GREEN LIGHT LASER TURP (TRANSURETHRAL RESECTION OF PROSTATE
Anesthesia: General | Wound class: Clean Contaminated

## 2012-07-02 MED ORDER — CEFAZOLIN SODIUM-DEXTROSE 2-3 GM-% IV SOLR
INTRAVENOUS | Status: AC
  Filled 2012-07-02: qty 50

## 2012-07-02 MED ORDER — BELLADONNA ALKALOIDS-OPIUM 16.2-60 MG RE SUPP
RECTAL | Status: DC | PRN
  Administered 2012-07-02: 1 via RECTAL

## 2012-07-02 MED ORDER — PHENAZOPYRIDINE HCL 200 MG PO TABS: 200.0000 mg | ORAL_TABLET | Freq: Three times a day (TID) | ORAL | Status: DC | PRN

## 2012-07-02 MED ORDER — METOCLOPRAMIDE HCL 5 MG/ML IJ SOLN: 10.0000 mg | Freq: Once | INTRAMUSCULAR | Status: DC | PRN

## 2012-07-02 MED ORDER — CIPROFLOXACIN HCL 500 MG PO TABS: 500.0000 mg | ORAL_TABLET | Freq: Two times a day (BID) | ORAL | Status: DC

## 2012-07-02 MED ORDER — OXYCODONE-ACETAMINOPHEN 5-325 MG PO TABS: 1.0000 | ORAL_TABLET | ORAL | Status: DC | PRN

## 2012-07-02 MED ORDER — FENTANYL CITRATE 0.05 MG/ML IJ SOLN: 25.0000 ug | INTRAMUSCULAR | Status: DC | PRN

## 2012-07-02 MED ORDER — OXYCODONE HCL 5 MG PO TABS: 5.0000 mg | ORAL_TABLET | Freq: Once | ORAL | Status: DC | PRN

## 2012-07-02 MED ORDER — BELLADONNA ALKALOIDS-OPIUM 16.2-60 MG RE SUPP
RECTAL | Status: AC
  Filled 2012-07-02: qty 1

## 2012-07-02 MED ORDER — ACETAMINOPHEN 10 MG/ML IV SOLN
INTRAVENOUS | Status: DC | PRN
  Administered 2012-07-02: 1000 mg via INTRAVENOUS

## 2012-07-02 MED ORDER — LIDOCAINE HCL (PF) 2 % IJ SOLN
INTRAMUSCULAR | Status: DC | PRN
  Administered 2012-07-02: 75 mg

## 2012-07-02 MED ORDER — FENTANYL CITRATE 0.05 MG/ML IJ SOLN
INTRAMUSCULAR | Status: DC | PRN
  Administered 2012-07-02: 100 ug via INTRAVENOUS
  Administered 2012-07-02: 50 ug via INTRAVENOUS

## 2012-07-02 MED ORDER — LACTATED RINGERS IV SOLN
INTRAVENOUS | Status: DC | PRN
  Administered 2012-07-02: 10:00:00 via INTRAVENOUS
  Administered 2012-07-02: 13:00:00

## 2012-07-02 MED ORDER — LIDOCAINE HCL 2 % EX GEL
CUTANEOUS | Status: AC
  Filled 2012-07-02: qty 10

## 2012-07-02 MED ORDER — SODIUM CHLORIDE 0.9 % IR SOLN
Status: DC | PRN
  Administered 2012-07-02: 7000 mL via INTRAVESICAL

## 2012-07-02 MED ORDER — PROPOFOL 10 MG/ML IV BOLUS
INTRAVENOUS | Status: DC | PRN
  Administered 2012-07-02: 200 mg via INTRAVENOUS

## 2012-07-02 MED ORDER — ACETAMINOPHEN 10 MG/ML IV SOLN
INTRAVENOUS | Status: AC
  Filled 2012-07-02: qty 100

## 2012-07-02 MED ORDER — ONDANSETRON HCL 4 MG/2ML IJ SOLN
INTRAMUSCULAR | Status: DC | PRN
  Administered 2012-07-02: 4 mg via INTRAVENOUS

## 2012-07-02 MED ORDER — OXYCODONE HCL 5 MG/5ML PO SOLN: 5.0000 mg | Freq: Once | ORAL | Status: DC | PRN

## 2012-07-02 MED ORDER — CEFAZOLIN SODIUM-DEXTROSE 2-3 GM-% IV SOLR
2.0000 g | INTRAVENOUS | Status: AC
  Administered 2012-07-02: 2 g via INTRAVENOUS

## 2012-07-02 MED ORDER — KETOROLAC TROMETHAMINE 30 MG/ML IJ SOLN
INTRAMUSCULAR | Status: DC | PRN
  Administered 2012-07-02: 30 mg via INTRAVENOUS

## 2012-07-02 SURGICAL SUPPLY — 23 items
BAG URINE DRAINAGE (UROLOGICAL SUPPLIES) ×2 IMPLANT
BAG URO CATCHER STRL LF (DRAPE) ×2 IMPLANT
CATH FOLEY 2WAY SLVR 30CC 22FR (CATHETERS) IMPLANT
CATH HEMA 3WAY 30CC 22FR COUDE (CATHETERS) ×1 IMPLANT
CLOTH BEACON ORANGE TIMEOUT ST (SAFETY) ×2 IMPLANT
DRAPE CAMERA CLOSED 9X96 (DRAPES) ×2 IMPLANT
ELECT BUTTON HF 24-28F 2 30DE (ELECTRODE) IMPLANT
ELECT LOOP MED HF 24F 12D (CUTTING LOOP) IMPLANT
ELECT LOOP MED HF 24F 12D CBL (CLIP) IMPLANT
ELECT RESECT VAPORIZE 12D CBL (ELECTRODE) IMPLANT
FEE RENTAL LASER GREENLIGHT (Laser) ×1 IMPLANT
GLOVE BIOGEL M STRL SZ7.5 (GLOVE) ×2 IMPLANT
GOWN STRL REIN XL XLG (GOWN DISPOSABLE) ×2 IMPLANT
HOLDER FOLEY CATH W/STRAP (MISCELLANEOUS) IMPLANT
KIT ASPIRATION TUBING (SET/KITS/TRAYS/PACK) ×2 IMPLANT
LASER FIBER /GREENLIGHT LASER (Laser) ×3 IMPLANT
LASER GREENLIGHT RENTAL P/PROC (Laser) ×2 IMPLANT
MANIFOLD NEPTUNE II (INSTRUMENTS) ×2 IMPLANT
PACK CYSTO (CUSTOM PROCEDURE TRAY) ×2 IMPLANT
PLUG CATH AND CAP STER (CATHETERS) ×1 IMPLANT
SYR 30ML LL (SYRINGE) IMPLANT
SYRINGE IRR TOOMEY STRL 70CC (SYRINGE) IMPLANT
TUBING CONNECTING 10 (TUBING) ×2 IMPLANT

## 2012-07-02 NOTE — Anesthesia Preprocedure Evaluation (Signed)
Anesthesia Evaluation  Patient identified by MRN, date of birth, ID band Patient awake    Reviewed: Allergy & Precautions, H&P , NPO status , Patient's Chart, lab work & pertinent test results, reviewed documented beta blocker date and time   Airway Mallampati: II TM Distance: >3 FB Neck ROM: full    Dental   Pulmonary neg pulmonary ROS,  breath sounds clear to auscultation        Cardiovascular hypertension, On Medications and On Home Beta Blockers + CAD, + Past MI and + CABG Rhythm:regular     Neuro/Psych CVA negative psych ROS   GI/Hepatic Neg liver ROS, GERD-  Medicated and Controlled,  Endo/Other  negative endocrine ROS  Renal/GU Renal disease  negative genitourinary   Musculoskeletal   Abdominal   Peds  Hematology negative hematology ROS (+)   Anesthesia Other Findings See surgeon's H&P   Reproductive/Obstetrics negative OB ROS                           Anesthesia Physical Anesthesia Plan  ASA: III  Anesthesia Plan: General   Post-op Pain Management:    Induction: Intravenous  Airway Management Planned: LMA  Additional Equipment:   Intra-op Plan:   Post-operative Plan: Extubation in OR  Informed Consent: I have reviewed the patients History and Physical, chart, labs and discussed the procedure including the risks, benefits and alternatives for the proposed anesthesia with the patient or authorized representative who has indicated his/her understanding and acceptance.   Dental Advisory Given  Plan Discussed with: CRNA and Surgeon  Anesthesia Plan Comments:         Anesthesia Quick Evaluation

## 2012-07-02 NOTE — H&P (Deleted)
Active Problems Problems  1. Benign Prostatic Hyperplasia Localized With Urinary Obstruction With Other Lower Urinary Tract  Symptoms 600.21 2. Incomplete Emptying Of Bladder 788.21 3. 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 Meatal Stenosis 598.9 5. Urine Stream Sprays Or Splits 788.61 6. Vitamin D Deficiency 268.9  History of Present Illness        77 YO male returns today for a cystoscopy & possible dilation.  He was last seen by Jetta Lout, NP on 05/04/12.  He stopped Rapaflo 2 months ago because it was "not helping with stream." Still has split stream but denies hematuria or dysuria.    Hx of BPH with LUTS (no longer taking Rapaflo X 2 months) & meatal stenosis. S/'P [facility] TURP 2011.  S/P cystoscopy on 11/23/10 which was normal with no signs of meatal stenosis.    Hx of split stream. S/P cystoscopy with urethral dilatation of meatal stenosis 09/27/10.   Past Medical History Problems  1. History of  Acute Myocardial Infarction V12.59 2. History of  Carotid Artery Catheterization V58.81 3. History of  Esophageal Reflux 530.81 4. History of  Heart Disease 429.9 5. History of  Hypercholesterolemia 272.0 6. History of  Transient Ischemic Attack 435.9  Surgical History Problems  1. History of  Appendectomy 2. History of  Bypass Graft Using Vein: Aortic-Bifemoral 3. History of  CABG (CABG) 4. History of  Cholecystectomy 5. History of  Cystoscopy For Urethral Stricture 6. History of  Cystoscopy For Urethral Stricture 7. History of  Dermatological Surgery Skin Graft V42.3 8. History of  Transurethral Resection Of Prostate (TURP) 9. History of  Ventral Hernia Repair  Current Meds 1. Aspirin 325 MG Oral Tablet; Therapy: (Recorded:03Nov2009) to 2. Carvedilol 3.125 MG Oral Tablet; Therapy: 30Nov2012 to 3. Enalapril Maleate 5 MG Oral Tablet; Therapy: 24Feb2012 to 4. Meloxicam 15 MG Oral Tablet; TAKE 1 TABLET DAILY WITH FOOD; Therapy: 24Jan2014 to  (Evaluate:21Feb2014)  Requested for: 24Jan2014; Last Rx:24Jan2014 5. Multiple Vitamin TABS; Therapy: (Recorded:03Nov2009) to 6. Nitrostat 0.4 MG Sublingual Tablet Sublingual; Therapy: 03Mar2013 to 7. Omega-3 CAPS; Therapy: (Recorded:03Nov2009) to 8. Omeprazole 20 MG Oral Capsule Delayed Release; Therapy: 27Jan2012 to 9. Simvastatin 20 MG Oral Tablet; Therapy: 23Sep2011 to 10. Triamcinolone Acetonide 0.1 % External Cream; Therapy: 13Jul2011 to 11. Vitamin D-1000 Max St TABS; Therapy: (Recorded:24Jan2014) to  Allergies Medication  1. Dapsone TABS  Family History Problems  1. Fraternal history of  Diabetes Mellitus V18.0 2. Fraternal history of  Diabetes Mellitus V18.0 3. Family history of  Family Health Status Number Of Children 1 son 4. Family history of  Father Deceased At Age ____ 67, pancreatic cancer 5. Fraternal history of  Glaucoma 6. Sororal history of  Glaucoma 7. Paternal history of  Malignant Pancreatic Neoplasm 8. Family history of  Mother Deceased At Age ____ 66, Parkinson's  Social History Problems  1. Caffeine Use 2 per day 2. Former Smoker 1 ppd x 78yrs, quit 69yrs ago 3. Marital History - Currently Married 4. Occupation: retired Nurse, children's  5. History of  Alcohol Use  Review of Systems Genitourinary, constitutional, skin, eye, otolaryngeal, hematologic/lymphatic, cardiovascular, pulmonary, endocrine, musculoskeletal, gastrointestinal, neurological and psychiatric system(s) were reviewed and pertinent findings if present are noted.  Genitourinary: urinary frequency, weak urinary stream, urinary stream starts and stops and incomplete emptying of bladder spilt urinary stream.    Vitals Vital Signs [Data Includes: Last 1 Day]  21Feb2014 02:49PM  Blood Pressure: 137 / 69 Temperature: 97.9 F Heart Rate: 58  Physical Exam Rectal:  Rectal exam demonstrates normal sphincter tone, no tenderness and no masses. Estimated prostate size is 2+. The prostate has no  nodularity and is not tender. The left seminal vesicle is nonpalpable. The right seminal vesicle is nonpalpable. The perineum is normal on inspection.    Results/Data Urine [Data Includes: Last 1 Day]   21Feb2014  COLOR YELLOW   APPEARANCE CLEAR   SPECIFIC GRAVITY 1.010   pH 7.0   GLUCOSE NEG mg/dL  BILIRUBIN NEG   KETONE NEG mg/dL  BLOOD NEG   PROTEIN NEG mg/dL  UROBILINOGEN 0.2 mg/dL  NITRITE NEG   LEUKOCYTE ESTERASE NEG   Selected Results  UA With REFLEX 21Feb2014 02:20PM Jethro Bolus   Test Name Result Flag Reference  COLOR YELLOW  YELLOW  APPEARANCE CLEAR  CLEAR  SPECIFIC GRAVITY 1.010  1.005-1.030  pH 7.0  5.0-8.0  GLUCOSE NEG mg/dL  NEG  BILIRUBIN NEG  NEG  KETONE NEG mg/dL  NEG  BLOOD NEG  NEG  PROTEIN NEG mg/dL  NEG  UROBILINOGEN 0.2 mg/dL  8.2-9.5  NITRITE NEG  NEG  LEUKOCYTE ESTERASE NEG  NEG   PSA REFLEX TO FREE 24Jan2014 11:57AM Jetta Lout  SPECIMEN TYPE: BLOOD   Test Name Result Flag Reference  PSA 0.49 ng/mL  <=4.00  Test Methodology: ECLIA PSA (Electrochemiluminescence Immunoassay)   Procedure  Procedure: Cystoscopy  Chaperone Present: kim lewis.  Indication: Lower Urinary Tract Symptoms. Meatal stenosis.  Informed Consent: Risks, benefits, and potential adverse events were discussed and informed consent was obtained from the patient.  Prep: The patient was prepped with betadine.  Anesthesia:. Local anesthesia was administered intraurethrally with 2% lidocaine jelly.  Antibiotic prophylaxis: Ciprofloxacin.  Procedure Note:  Urethral meatus:. No abnormalities.  Anterior urethra: No abnormalities.  Prostatic urethra:. The lateral prostatic lobes were enlarged. Reseceted L Lateral lobe. prominent R lateral lobe.  Bladder: Flexible scopt: ball-valve prostate nodule at bladder neck. Visulization was clear. The ureteral orifices were in the normal anatomic position bilaterally. A systematic survey of the bladder demonstrated no bladder tumors  or stones. The patient tolerated the procedure well.  Complications: None.    Assessment Assessed  1. Benign Prostatic Hyperplasia Localized With Urinary Obstruction With Other Lower Urinary Tract  Symptoms 600.21 2. Urine Stream Sprays Or Splits 788.61   Split stream with voiding difficulty 2 yrs post TURP. Cysto appears open, but flexible scope shows ball-valve tissue at bladder neck in retrofrex position. I  have discussed this wiht Mr Thornhill, and believe that he does not need another TURP; rather, that he needs Greenlite laser to carve a trench at 7 o'clock, and allow the median lobe prostatic tissue to rise up into the bladder outlet, where it can be vaporized. He is in agreement, because he is concerned that , at age 4, he is having more difficulty voiding, and that as he gets older that he wil not be able to void.   Plan Testicular Pain (608.9)  1. Meloxicam 15 MG Oral Tablet; TAKE 1 TABLET DAILY WITH FOOD; Therapy: 24Jan2014 to  21Feb2014; Last Rx:24Jan2014; Status: DISCONTINUED   Greenlight laser at  7 o'clock and then median lobe. Cathetetr for 2 days post op.  No evidence of  stricture.   Signatures Electronically signed by : Jethro Bolus, M.D.; Jun 01 2012  3:29PM

## 2012-07-02 NOTE — Anesthesia Postprocedure Evaluation (Signed)
Anesthesia Post Note  Patient: Gary Ferrell  Procedure(s) Performed: Procedure(s) (LRB): GREEN LIGHT LASER TURP (TRANSURETHRAL RESECTION OF PROSTATE (N/A)  Anesthesia type: General  Patient location: PACU  Post pain: Pain level controlled  Post assessment: Patient's Cardiovascular Status Stable  Last Vitals:  Filed Vitals:   07/02/12 1245  BP: 124/52  Pulse: 49  Temp:   Resp: 11    Post vital signs: Reviewed and stable  Level of consciousness: alert  Complications: No apparent anesthesia complications

## 2012-07-02 NOTE — H&P (Signed)
Gary Ferrell is an 77 y.o. male.    HPI:   Recurrent bladder outlet obstruction, needing L lateral prostatic lobe resection.   Past Medical History  Diagnosis Date  . Hypertension   . Hyperlipidemia   . Myocardial infarction   . Coronary artery disease   . Lung nodules   . Stroke     2007 post cabg   . GERD (gastroesophageal reflux disease)   . Spinal stenosis     reported by wife on 06/29/12- please see note in progress note 06/29/12   . Chronic kidney disease     Past Surgical History  Procedure Laterality Date  . Cardiac catheterization      2012  . Coronary artery bypass graft      2007  . Carotid endartarectomy     . Appendectomy    . Abdominal aortic befemoral bypass    . Cholecystectomy    . Herniated intestinal obstruction     . Abdomina lskin graft       2008  . Ventral epigastric hernia repair    . Prostate surgery      x2   . Urethra media plasty       Medications Prior to Admission  Medication Sig Dispense Refill  . carvedilol (COREG) 3.125 MG tablet Take 3.125 mg by mouth 2 (two) times daily with a meal.      . enalapril (VASOTEC) 2.5 MG tablet Take 2.5 mg by mouth daily before breakfast.      . omeprazole (PRILOSEC OTC) 20 MG tablet Take 20 mg by mouth daily.      . simvastatin (ZOCOR) 40 MG tablet Take 40 mg by mouth every evening.      Marland Kitchen aspirin 325 MG tablet Take 325 mg by mouth daily.      . cholecalciferol (VITAMIN D) 1000 UNITS tablet Take 1,000 Units by mouth daily.      . Multiple Vitamin (MULTIVITAMIN WITH MINERALS) TABS Take 1 tablet by mouth daily.      Marland Kitchen omega-3 acid ethyl esters (LOVAZA) 1 G capsule Take 1 g by mouth daily.        Allergies:  Allergies  Allergen Reactions  . Dapsone     REACTION: caused neuropathy/ affected walking    History reviewed. No pertinent family history.  Social History:  reports that he quit smoking about 23 years ago. He has never used smokeless tobacco. He reports that he does not drink alcohol or use  illicit drugs.  Review of Systems: Pertinent items are noted in HPI. A comprehensive review of systems was negative except for: frequency, urgency, decreased size and force of urinary stream.   No results found for this or any previous visit (from the past 48 hour(s)).  No results found.     Physical Exam: General appearance: alert and appears stated age Head: Normocephalic, without obvious abnormality, atraumatic Eyes: conjunctivae/corneas clear. EOM's intact.  Oropharynx: moist mucous membranes Neck: supple, symmetrical, trachea midline Resp: normal respiratory effort Cardio: regular rate and rhythm Back: symmetric, no curvature. ROM normal. No CVA tenderness. GI: soft, non-tender; bowel sounds normal; no masses,  no organomegaly Male genitalia: penis: normal male phallus with no lesions or discharge.Testes: bilaterally descended with no masses or tenderness. no hernias Pelvic: deferred Extremities: extremities normal, atraumatic, no cyanosis or edema Skin: Skin color normal. No visible rashes or lesions Neurologic: Grossly normal  Assessment/Plan Recurrent BPH Plan: Greenlight TURP of of prostate.   [name] 07/02/2012, 11:12 AM

## 2012-07-02 NOTE — Interval H&P Note (Signed)
History and Physical Interval Note:  07/02/2012 11:15 AM  Gary Ferrell  has presented today for surgery, with the diagnosis of Benign Prostatic Hyperplasia  The various methods of treatment have been discussed with the patient and family. After consideration of risks, benefits and other options for treatment, the patient has consented to  Procedure(s): GREEN LIGHT LASER TURP (TRANSURETHRAL RESECTION OF PROSTATE (N/A) as a surgical intervention .  The patient's history has been reviewed, patient examined, no change in status, stable for surgery.  I have reviewed the patient's chart and labs.  Questions were answered to the patient's satisfaction.     Jethro Bolus I

## 2012-07-02 NOTE — Preoperative (Signed)
Beta Blockers   Reason not to administer Beta Blockers:Took Coreg this am. 

## 2012-07-02 NOTE — Transfer of Care (Signed)
Immediate Anesthesia Transfer of Care Note  Patient: Gary Ferrell  Procedure(s) Performed: Procedure(s): GREEN LIGHT LASER TURP (TRANSURETHRAL RESECTION OF PROSTATE (N/A)  Patient Location: PACU  Anesthesia Type:General  Level of Consciousness: awake, sedated and patient cooperative  Airway & Oxygen Therapy: Patient Spontanous Breathing and Patient connected to face mask oxygen  Post-op Assessment: Report given to PACU RN and Post -op Vital signs reviewed and stable  Post vital signs: Reviewed and stable  Complications: No apparent anesthesia complications

## 2012-07-02 NOTE — Op Note (Signed)
Pre-operative diagnosis :    BPH  Postoperative diagnosis:  Same  Operation:  Greenlight Laser Prostatectomy  Surgeon:  S. Patsi Sears, MD  First assistant:  None  Anesthesia:  general  Preparation:  After appropriate preanesthesia, the patient was brought to the operating room, placed on the operating table in dorsal supine position where general LMA anesthesia was introduced. He was then replaced in the dorsal lithotomy position with pubis was prepped with Betadine solution and draped in usual fashion. Armband was double checked.   Review history:Problems  1. Benign Prostatic Hyperplasia Localized With Urinary Obstruction With Other Lower Urinary Tract  Symptoms 600.21  2. Incomplete Emptying Of Bladder 788.21  3. 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 Meatal Stenosis 598.9  5. Urine Stream Sprays Or Splits 788.61  6. Vitamin D Deficiency 268.9   Statement of  Likelihood of Success: Excellent. TIME-OUT observed.:  Procedure:   Cystourethroscopy Compass, and shows a bilobar BPH, with a short prostatic urethra. The right lobe of prostate appears to be greatly enlarged, obstructing the prostate across the midline.  Easily greenlight laser, H. range is developed at the 7:00 and the 5:00 positions using 80 W of power. Using 120 W power, the median lobes are vaporized. Excellent opening of the prostate is accomplished, with no bleeding. A size 20 catheter with 30 cc in balloon is placed, and the patient was awakened, taken to recovery room in good condition. He is given IV Tylenol at the beginning of the procedure, and IV Toradol at the end of the procedure.

## 2012-07-03 ENCOUNTER — Encounter (HOSPITAL_COMMUNITY): Payer: Self-pay | Admitting: Urology

## 2012-07-17 ENCOUNTER — Other Ambulatory Visit: Payer: Medicare Other

## 2012-07-17 ENCOUNTER — Ambulatory Visit: Payer: Medicare Other | Admitting: Neurosurgery

## 2013-05-09 ENCOUNTER — Encounter: Payer: Self-pay | Admitting: Gastroenterology

## 2013-05-17 ENCOUNTER — Other Ambulatory Visit: Payer: Self-pay | Admitting: Vascular Surgery

## 2013-05-17 DIAGNOSIS — Z48812 Encounter for surgical aftercare following surgery on the circulatory system: Secondary | ICD-10-CM

## 2013-05-17 DIAGNOSIS — I6529 Occlusion and stenosis of unspecified carotid artery: Secondary | ICD-10-CM

## 2013-06-11 ENCOUNTER — Ambulatory Visit (INDEPENDENT_AMBULATORY_CARE_PROVIDER_SITE_OTHER): Payer: Medicare Other | Admitting: Internal Medicine

## 2013-06-11 ENCOUNTER — Encounter (INDEPENDENT_AMBULATORY_CARE_PROVIDER_SITE_OTHER): Payer: Self-pay

## 2013-06-11 ENCOUNTER — Encounter: Payer: Self-pay | Admitting: Internal Medicine

## 2013-06-11 VITALS — BP 130/78 | HR 70 | Ht 72.0 in | Wt 168.0 lb

## 2013-06-11 DIAGNOSIS — I472 Ventricular tachycardia, unspecified: Secondary | ICD-10-CM | POA: Insufficient documentation

## 2013-06-11 DIAGNOSIS — I5022 Chronic systolic (congestive) heart failure: Secondary | ICD-10-CM | POA: Insufficient documentation

## 2013-06-11 DIAGNOSIS — I255 Ischemic cardiomyopathy: Secondary | ICD-10-CM | POA: Insufficient documentation

## 2013-06-11 DIAGNOSIS — Z95810 Presence of automatic (implantable) cardiac defibrillator: Secondary | ICD-10-CM

## 2013-06-11 DIAGNOSIS — I2589 Other forms of chronic ischemic heart disease: Secondary | ICD-10-CM

## 2013-06-11 NOTE — Assessment & Plan Note (Signed)
I have discussed the treatment options with the patient and his wife. The risk/benefits/goals/expectations have been discussed with the patient and he wishes to proceed. He will call us to schedule BiV ICD implant.

## 2013-06-11 NOTE — Patient Instructions (Signed)
Your physician has recommended that you have a defibrillator inserted. An implantable cardioverter defibrillator (ICD) is a small device that is placed in your chest or, in rare cases, your abdomen. This device uses electrical pulses or shocks to help control life-threatening, irregular heartbeats that could lead the heart to suddenly stop beating (sudden cardiac arrest). Leads are attached to the ICD that goes into your heart. This is done in the hospital and usually requires an overnight stay. Please see the instruction sheet given to you today for more information.---Bi-V ICD     If you decide to proceed with implant please call Leonia Reader at [phone]    Dates 3/11, 3/12, 3/16, 3/17, 3/19, 2/23, 3/24,4/01, 4/03, 4/06, 4/07, 4/09, 4/13, 4/17, 4/20, 4/22, 4/24, 4/27, 4/30

## 2013-06-11 NOTE — Assessment & Plan Note (Signed)
He is s/p bypass surgery and denies chest pain. Will follow.

## 2013-06-11 NOTE — Assessment & Plan Note (Deleted)
Interogation of his BiV ICD demonstrates normal device function. Will plan to recheck in several months.

## 2013-06-11 NOTE — Progress Notes (Signed)
HPI [name] is referred today for consideration for ICD implant. He has an ischemic CM, s/p CABG in 2007, LBBB, HTN, and dyslipidemia.His EF was 25% by echo in 2012, 35% by echo in 2014, and 35% by echo in 2015. He is on maximal medical therapy. He has class 2 CHF symptoms and a QRS duration of 160 ms with LBBB. He has never had syncope. No peripheral edema. No anginal symptoms. Allergies  Allergen Reactions  . Dapsone     REACTION: caused neuropathy/ affected walking     Current Outpatient Prescriptions  Medication Sig Dispense Refill  . aspirin 325 MG tablet Take 325 mg by mouth daily.      . carvedilol (COREG) 3.125 MG tablet Take 3.125 mg by mouth 2 (two) times daily with a meal.      . cholecalciferol (VITAMIN D) 1000 UNITS tablet Take 1,000 Units by mouth daily.      . enalapril (VASOTEC) 2.5 MG tablet Take 2.5 mg by mouth as needed.       . Multiple Vitamin (MULTIVITAMIN WITH MINERALS) TABS Take 1 tablet by mouth daily.      Marland Kitchen omega-3 acid ethyl esters (LOVAZA) 1 G capsule Take 1 g by mouth daily.      Marland Kitchen omeprazole (PRILOSEC OTC) 20 MG tablet Take 20 mg by mouth daily.      . simvastatin (ZOCOR) 40 MG tablet Take 40 mg by mouth every evening.       No current facility-administered medications for this visit.     Past Medical History  Diagnosis Date  . Hypertension   . Hyperlipidemia   . Myocardial infarction   . Coronary artery disease   . Lung nodules   . Stroke     2007 post cabg   . GERD (gastroesophageal reflux disease)   . Spinal stenosis     reported by wife on 06/29/12- please see note in progress note 06/29/12   . Chronic kidney disease     ROS:   All systems reviewed and negative except as noted in the HPI.   Past Surgical History  Procedure Laterality Date  . Cardiac catheterization      2012  . Coronary artery bypass graft      2007  . Carotid endartarectomy     . Appendectomy    . Abdominal aortic befemoral bypass    . Cholecystectomy     . Herniated intestinal obstruction     . Abdomina lskin graft       2008  . Ventral epigastric hernia repair    . Prostate surgery      x2   . Urethra media plasty     . Green light laser turp (transurethral resection of prostate N/A 07/02/2012    Procedure: GREEN LIGHT LASER TURP (TRANSURETHRAL RESECTION OF PROSTATE;  Surgeon: Ailene Rud, MD;  Location: [facility];  Service: Urology;  Laterality: N/A;     No family history on file.   History   Social History  . Marital Status: Married    Spouse Name: N/A    Number of Children: N/A  . Years of Education: N/A   Occupational History  . Not on file.   Social History Main Topics  . Smoking status: Former Smoker    Quit date: 04/11/1989  . Smokeless tobacco: Never Used  . Alcohol Use: No  . Drug Use: No  . Sexual Activity: Not on file   Other Topics Concern  .  Not on file   Social History Narrative  . No narrative on file     BP 130/78  Pulse 70  Ht 6' (1.829 m)  Wt 168 lb (76.204 kg)  BMI 22.78 kg/m2  Physical Exam:  Well appearing 78yo man, NAD HEENT: Unremarkable Neck:  No JVD, no thyromegally Back:  No CVA tenderness Lungs:  Clear with no wheezes HEART:  Regular rate rhythm, no murmurs, no rubs, no clicks Abd:  soft, positive bowel sounds, no organomegally, no rebound, no guarding Ext:  2 plus pulses, no edema, no cyanosis, no clubbing Skin:  No rashes no nodules Neuro:  CN II through XII intact, motor grossly intact  EKG - NSR with LBBB, QRS 160  Assess/Plan:

## 2013-06-13 ENCOUNTER — Telehealth: Payer: Self-pay | Admitting: Internal Medicine

## 2013-06-13 ENCOUNTER — Encounter (HOSPITAL_COMMUNITY): Payer: Self-pay | Admitting: Pharmacy Technician

## 2013-06-13 ENCOUNTER — Encounter: Payer: Self-pay | Admitting: *Deleted

## 2013-06-13 DIAGNOSIS — I2589 Other forms of chronic ischemic heart disease: Secondary | ICD-10-CM

## 2013-06-13 DIAGNOSIS — I5022 Chronic systolic (congestive) heart failure: Secondary | ICD-10-CM

## 2013-06-13 NOTE — Telephone Encounter (Signed)
I spoke with the patient. He will have his Bi-V ICD implant on 3/17. Labs will be on 3/9 in the [place] office. Letter of instructions mailed to the patient.

## 2013-06-13 NOTE — Telephone Encounter (Signed)
New message  Pt called states that he wants to proceed with the surgery.. Please call

## 2013-06-17 ENCOUNTER — Ambulatory Visit (INDEPENDENT_AMBULATORY_CARE_PROVIDER_SITE_OTHER): Payer: Medicare Other | Admitting: *Deleted

## 2013-06-17 DIAGNOSIS — I2589 Other forms of chronic ischemic heart disease: Secondary | ICD-10-CM

## 2013-06-17 DIAGNOSIS — I5022 Chronic systolic (congestive) heart failure: Secondary | ICD-10-CM

## 2013-06-18 LAB — BASIC METABOLIC PANEL
BUN/Creatinine Ratio: 14 (ref 10–22)
BUN: 14 mg/dL (ref 8–27)
CO2: 26 mmol/L (ref 18–29)
CREATININE: 1.01 mg/dL (ref 0.76–1.27)
Calcium: 9.4 mg/dL (ref 8.6–10.2)
Chloride: 102 mmol/L (ref 97–108)
GFR, EST AFRICAN AMERICAN: 84 mL/min/{1.73_m2} (ref >59)
GFR, EST NON AFRICAN AMERICAN: 72 mL/min/{1.73_m2} (ref >59)
GLUCOSE: 132 mg/dL — AB (ref 65–99)
Potassium: 4.9 mmol/L (ref 3.5–5.2)
Sodium: 142 mmol/L (ref 134–144)

## 2013-06-18 LAB — CBC WITH DIFFERENTIAL/PLATELET
BASOS: 1 %
Basophils Absolute: 0 10*3/uL (ref 0.0–0.2)
EOS: 0 %
Eosinophils Absolute: 0 10*3/uL (ref 0.0–0.4)
HEMATOCRIT: 40.3 % (ref 37.5–51.0)
Hemoglobin: 13.7 g/dL (ref 12.6–17.7)
IMMATURE GRANS (ABS): 0 10*3/uL (ref 0.0–0.1)
Immature Granulocytes: 0 %
LYMPHS ABS: 0.7 10*3/uL (ref 0.7–3.1)
Lymphs: 18 %
MCH: 30.7 pg (ref 26.6–33.0)
MCHC: 34 g/dL (ref 31.5–35.7)
MCV: 90 fL (ref 79–97)
MONOS ABS: 0.4 10*3/uL (ref 0.1–0.9)
Monocytes: 10 %
Neutrophils Absolute: 2.8 10*3/uL (ref 1.4–7.0)
Neutrophils Relative %: 71 %
RBC: 4.46 x10E6/uL (ref 4.14–5.80)
RDW: 13.8 % (ref 12.3–15.4)
WBC: 3.9 10*3/uL (ref 3.4–10.8)

## 2013-06-19 ENCOUNTER — Other Ambulatory Visit: Payer: Self-pay | Admitting: *Deleted

## 2013-06-19 DIAGNOSIS — I255 Ischemic cardiomyopathy: Secondary | ICD-10-CM

## 2013-06-20 ENCOUNTER — Telehealth: Payer: Self-pay | Admitting: Internal Medicine

## 2013-06-20 NOTE — Telephone Encounter (Signed)
New Message:  Pt is asking if there are certain limitations to his defibrillator and pacemaker.. Pt states he will soon be getting both of those devices implanted. He wants to know if he will need to avoid microwaves or other things with those devices.... Pt is requesting a call back from the nurse.

## 2013-06-21 NOTE — Telephone Encounter (Signed)
Updated pt & his spouse on acute & chronic limitations w/ lifting and arm movements post implant.  Clarified microwaves are safe.  Clarified airport Airline pilot.  Clarified showering restrictions until wound check appt.  Advised pt to avoid all electro-magnetic interference.

## 2013-06-21 NOTE — Telephone Encounter (Signed)
LMOVM w/ my direct #. 

## 2013-06-24 ENCOUNTER — Telehealth: Payer: Self-pay | Admitting: Internal Medicine

## 2013-06-24 MED ORDER — SODIUM CHLORIDE 0.9 % IR SOLN
80.0000 mg | Status: DC
  Filled 2013-06-24 (×2): qty 2

## 2013-06-24 MED ORDER — CEFAZOLIN SODIUM-DEXTROSE 2-3 GM-% IV SOLR
2.0000 g | INTRAVENOUS | Status: DC
  Filled 2013-06-24: qty 50

## 2013-06-24 NOTE — Telephone Encounter (Signed)
New message     Having a defib device put in tomorrow----today pt has runny nose, red eye but no drainage, no coughing, no fever--pt states he feels ok.  He has been taking over-the-counter cold medication.  Can he still have procedure tomorrow?

## 2013-06-25 ENCOUNTER — Ambulatory Visit (HOSPITAL_COMMUNITY)
Admission: RE | Admit: 2013-06-25 | Discharge: 2013-06-26 | Disposition: A | Discharge status: Home / Self Care | Payer: Medicare Other | Source: Ambulatory Visit | Attending: Internal Medicine | Admitting: Internal Medicine

## 2013-06-25 ENCOUNTER — Encounter (HOSPITAL_COMMUNITY)
Admission: RE | Disposition: A | Discharge status: Home / Self Care | Payer: Self-pay | Source: Ambulatory Visit | Attending: Internal Medicine

## 2013-06-25 ENCOUNTER — Encounter (HOSPITAL_COMMUNITY): Payer: Self-pay | Admitting: General Practice

## 2013-06-25 DIAGNOSIS — I255 Ischemic cardiomyopathy: Secondary | ICD-10-CM

## 2013-06-25 DIAGNOSIS — Z7982 Long term (current) use of aspirin: Secondary | ICD-10-CM | POA: Insufficient documentation

## 2013-06-25 DIAGNOSIS — I9589 Other hypotension: Secondary | ICD-10-CM | POA: Insufficient documentation

## 2013-06-25 DIAGNOSIS — B744 Mansonelliasis: Secondary | ICD-10-CM | POA: Insufficient documentation

## 2013-06-25 DIAGNOSIS — I509 Heart failure, unspecified: Secondary | ICD-10-CM | POA: Insufficient documentation

## 2013-06-25 DIAGNOSIS — I2589 Other forms of chronic ischemic heart disease: Secondary | ICD-10-CM | POA: Insufficient documentation

## 2013-06-25 DIAGNOSIS — I5022 Chronic systolic (congestive) heart failure: Secondary | ICD-10-CM | POA: Diagnosis present

## 2013-06-25 DIAGNOSIS — N189 Chronic kidney disease, unspecified: Secondary | ICD-10-CM | POA: Insufficient documentation

## 2013-06-25 DIAGNOSIS — I2510 Atherosclerotic heart disease of native coronary artery without angina pectoris: Secondary | ICD-10-CM | POA: Insufficient documentation

## 2013-06-25 DIAGNOSIS — I447 Left bundle-branch block, unspecified: Secondary | ICD-10-CM | POA: Insufficient documentation

## 2013-06-25 DIAGNOSIS — E785 Hyperlipidemia, unspecified: Secondary | ICD-10-CM | POA: Insufficient documentation

## 2013-06-25 DIAGNOSIS — I129 Hypertensive chronic kidney disease with stage 1 through stage 4 chronic kidney disease, or unspecified chronic kidney disease: Secondary | ICD-10-CM | POA: Insufficient documentation

## 2013-06-25 DIAGNOSIS — K219 Gastro-esophageal reflux disease without esophagitis: Secondary | ICD-10-CM | POA: Insufficient documentation

## 2013-06-25 HISTORY — PX: BI-VENTRICULAR IMPLANTABLE CARDIOVERTER DEFIBRILLATOR: SHX5459

## 2013-06-25 HISTORY — PX: BI-VENTRICULAR IMPLANTABLE CARDIOVERTER DEFIBRILLATOR  (CRT-D): SHX5747

## 2013-06-25 LAB — SURGICAL PCR SCREEN
MRSA, PCR: NEGATIVE
Staphylococcus aureus: NEGATIVE

## 2013-06-25 SURGERY — BI-VENTRICULAR IMPLANTABLE CARDIOVERTER DEFIBRILLATOR  (CRT-D)
Anesthesia: LOCAL

## 2013-06-25 MED ORDER — CEFAZOLIN SODIUM-DEXTROSE 2-3 GM-% IV SOLR
2.0000 g | Freq: Four times a day (QID) | INTRAVENOUS | Status: AC
  Administered 2013-06-25 – 2013-06-26 (×3): 2 g via INTRAVENOUS
  Filled 2013-06-25 (×3): qty 50

## 2013-06-25 MED ORDER — ONDANSETRON HCL 4 MG/2ML IJ SOLN
4.0000 mg | Freq: Four times a day (QID) | INTRAMUSCULAR | Status: DC | PRN
Start: 2013-06-25 — End: 2013-06-26

## 2013-06-25 MED ORDER — CARVEDILOL 3.125 MG PO TABS
3.1250 mg | ORAL_TABLET | Freq: Two times a day (BID) | ORAL | Status: DC
  Filled 2013-06-25 (×4): qty 1

## 2013-06-25 MED ORDER — SIMVASTATIN 40 MG PO TABS
40.0000 mg | ORAL_TABLET | Freq: Every evening | ORAL | Status: DC
  Filled 2013-06-25 (×2): qty 1

## 2013-06-25 MED ORDER — CHLORHEXIDINE GLUCONATE 4 % EX LIQD
60.0000 mL | Freq: Once | CUTANEOUS | Status: DC
Start: 2013-06-25 — End: 2013-06-25
  Filled 2013-06-25: qty 60

## 2013-06-25 MED ORDER — ACETAMINOPHEN 325 MG PO TABS
325.0000 mg | ORAL_TABLET | ORAL | Status: DC | PRN
  Administered 2013-06-26: 650 mg via ORAL
  Filled 2013-06-25: qty 2

## 2013-06-25 MED ORDER — ASPIRIN 325 MG PO TABS
325.0000 mg | ORAL_TABLET | Freq: Every day | ORAL | Status: DC
  Filled 2013-06-25 (×2): qty 1

## 2013-06-25 MED ORDER — ADULT MULTIVITAMIN W/MINERALS CH
1.0000 | ORAL_TABLET | Freq: Every day | ORAL | Status: DC
  Filled 2013-06-25 (×2): qty 1

## 2013-06-25 MED ORDER — OMEGA-3-ACID ETHYL ESTERS 1 G PO CAPS
1.0000 g | ORAL_CAPSULE | Freq: Every day | ORAL | Status: DC
  Filled 2013-06-25 (×2): qty 1

## 2013-06-25 MED ORDER — LIDOCAINE HCL (PF) 1 % IJ SOLN
INTRAMUSCULAR | Status: AC
  Filled 2013-06-25: qty 60

## 2013-06-25 MED ORDER — HEPARIN (PORCINE) IN NACL 2-0.9 UNIT/ML-% IJ SOLN
INTRAMUSCULAR | Status: AC
  Filled 2013-06-25: qty 500

## 2013-06-25 MED ORDER — FENTANYL CITRATE 0.05 MG/ML IJ SOLN
INTRAMUSCULAR | Status: AC
  Filled 2013-06-25: qty 2

## 2013-06-25 MED ORDER — SODIUM CHLORIDE 0.9 % IV SOLN
INTRAVENOUS | Status: DC
  Administered 2013-06-25: 11:00:00 via INTRAVENOUS

## 2013-06-25 MED ORDER — VITAMIN D3 25 MCG (1000 UNIT) PO TABS
1000.0000 [IU] | ORAL_TABLET | Freq: Every day | ORAL | Status: DC
  Filled 2013-06-25 (×2): qty 1

## 2013-06-25 MED ORDER — MIDAZOLAM HCL 5 MG/5ML IJ SOLN
INTRAMUSCULAR | Status: AC
  Filled 2013-06-25: qty 5

## 2013-06-25 MED ORDER — MUPIROCIN 2 % EX OINT
TOPICAL_OINTMENT | Freq: Two times a day (BID) | CUTANEOUS | Status: DC
  Administered 2013-06-25: 11:00:00 via NASAL
  Filled 2013-06-25: qty 22

## 2013-06-25 MED ORDER — ENALAPRIL MALEATE 2.5 MG PO TABS
2.5000 mg | ORAL_TABLET | Freq: Every day | ORAL | Status: DC
  Filled 2013-06-25: qty 1

## 2013-06-25 MED ORDER — MUPIROCIN 2 % EX OINT
TOPICAL_OINTMENT | CUTANEOUS | Status: AC
  Filled 2013-06-25: qty 22

## 2013-06-25 NOTE — Telephone Encounter (Signed)
Spoke with patient and he is not running a fever just a cold.  Let him know this should be fine

## 2013-06-25 NOTE — CV Procedure (Signed)
SURGEON:  Cristopher Peru, MD      PREPROCEDURE DIAGNOSES:   1. Ischemic cardiomyopathy EF 35%  2. New York Heart Association class II, heart failure chronically.   3. Left bundle-branch block.      POSTPROCEDURE DIAGNOSES:   1. Ischemic cardiomyopathy.   2. New York Heart Association class II heart failure chronically.   3. Left bundle-branch block.      PROCEDURES:    1. Biventricular ICD implantation.  2. Defibrillation threshold testing 3. Venography of the coronary sinus 4. Venography of the left upper extremity     INTRODUCTION:  Gary Ferrell is a 78 y.o. male with a ischemic CM (EF 35%), NYHA Class II CHF, and LBBB QRS morophology. At this time, he meets MADIT II/ SCD-HeFT criteria for ICD implantation for primary prevention of sudden death.  Given LBBB, QRS duration of 160 ms, the patient may also be expected to benefit from resynchronization therapy. The patient has been treated with an optimal medical regimen but continues to have a depressed ejection fraction and NYHA Class II CHF symptoms.  he therefore  presents today for a biventricular ICD implantation.      DESCRIPTION OF PROCEDURE:  Informed written consent was obtained and the   patient was brought to the electrophysiology lab in the fasting state. The patient was adequately sedated with intravenous Versed and Fentanyl as outlined in the nursing report.  The patient's left chest was prepped and draped in the usual sterile fashion by the EP lab staff.  The skin overlying the left deltopectoral region was infiltrated with lidocaine for local analgesia.  A 6-cm incision was made over the left deltopectoral region.  A left subcutaneous defibrillator pocket was fashioned using a combination of sharp and blunt dissection.  Electrocautery was used to assure hemostasis.   Left Upper extremity Venography:  A venogram of the left upper extremity was performed after a guide wire could not be advanced which revealed a small sized left  axillary vein which emptied into a moderate sized left subclavian vein.    RA/RV Lead Placement: The left axillary vein was cannulated with fluoroscopic visualization.  No contrast was required for this endeavor.  Through the left axillary vein, a St. Jude model X233739 (serial # [id number]  ) right atrial lead and a St. Jude Durata single coil (serial number [id number]) right ventricular defibrillator lead were advanced with fluoroscopic visualization into the right atrial appendage and right ventricular apical septal positions respectively.  Initial atrial lead P-waves measured 2 mV with an impedance of 417 ohms and a threshold of 1.4 volts at 0.5 milliseconds.  The right ventricular lead R-wave measured 21 mV with impedance of 690 ohms and a threshold of 1.0 volts at 0.5 milliseconds.   LV Lead Placement:  A St. Jude guide was advanced through the left axillary vein into the low lateral right atrium. A 6 french hexapolar EP catheter was introduced through the Brooks. Jude guide and used to cannulate the coronary sinus. A coronary sinus nonselective venogram was performed by hand injection of nonionic contrast. This demonstrated a large posterolateral vein.  A 0.014 angioplasty guide wire was introduced through the St. Jude Guide and advanced into the posterolateral vein. A St. Jude(serial number [id number]) quadrapolar lead was advanced through the coronary sinus and into the distal lateral branch. This was approximately two-thirds from the base to the apex in a very lateral  position. In this location with the bipolar configuration, the left ventricular lead R-waves measured  20 mV with impedance of 300 ohms and a threshold of 1.5 volt at 0.5 Milliseconds with no diaphragmatic stimulation observed when pacing at 10 volts output. The St. Jude guide was therefore removed.  All three leads were secured to the pectoralis fascia using #2 silk suture over the suture sleeves. The pocket then irrigated with copious  gentamicin solution.   Device Placement: The leads were then  connected to a St. Jude BiV ICD(serial  Number [id number]).  The defibrillator was placed into the  pocket.  The pocket was then closed in 2 layers with 2.0 Vicryl suture  for the subcutaneous and subcuticular layers.  Steri-Strips and a  sterile dressing were then applied.   DFT Testing: Defibrillation Threshold testing was then performed. Ventricular fibrillation was induced with a T shock.  Adequate sensing of ventricular  fibrillation was observed with minimal dropout with a programmed sensitivity of 1.15mV.  The patient was successfully defibrillated to sinus rhythm with a single 15 joules shock delivered from the device with an impedance of 68 ohms in a duration of 4.5 seconds.  The patient remained in sinus rhythm thereafter.  There were no early apparent complications.      CONCLUSIONS:   1. Ischemic cardiomyopathy with Left bundle-branch block and chronic New York Heart Association class II heart failure.   2. Successful biventricular ICD implantation.   3. DFT less than or equal to 15 joules.   4. No early apparent complications.   Cristopher Peru, MD  3:50 PM 06/25/2013  SURGEON:  Cristopher Peru, MD

## 2013-06-25 NOTE — Interval H&P Note (Signed)
History and Physical Interval Note:  06/25/2013 1:27 PM  Gary Ferrell  has presented today for surgery, with the diagnosis of cardiomyopathy/ branch block  The various methods of treatment have been discussed with the patient and family. After consideration of risks, benefits and other options for treatment, the patient has consented to  Procedure(s): BI-VENTRICULAR IMPLANTABLE CARDIOVERTER DEFIBRILLATOR  (CRT-D) (N/A) as a surgical intervention .  The patient's history has been reviewed, patient examined, no change in status, stable for surgery.  I have reviewed the patient's chart and labs.  Questions were answered to the patient's satisfaction.     Mikle Bosworth.D

## 2013-06-25 NOTE — H&P (View-Only) (Signed)
    HPI [name] is referred today for consideration for ICD implant. He has an ischemic CM, s/p CABG in 2007, LBBB, HTN, and dyslipidemia.His EF was 25% by echo in 2012, 35% by echo in 2014, and 35% by echo in 2015. He is on maximal medical therapy. He has class 2 CHF symptoms and a QRS duration of 160 ms with LBBB. He has never had syncope. No peripheral edema. No anginal symptoms. Allergies  Allergen Reactions  . Dapsone     REACTION: caused neuropathy/ affected walking     Current Outpatient Prescriptions  Medication Sig Dispense Refill  . aspirin 325 MG tablet Take 325 mg by mouth daily.      . carvedilol (COREG) 3.125 MG tablet Take 3.125 mg by mouth 2 (two) times daily with a meal.      . cholecalciferol (VITAMIN D) 1000 UNITS tablet Take 1,000 Units by mouth daily.      . enalapril (VASOTEC) 2.5 MG tablet Take 2.5 mg by mouth as needed.       . Multiple Vitamin (MULTIVITAMIN WITH MINERALS) TABS Take 1 tablet by mouth daily.      . omega-3 acid ethyl esters (LOVAZA) 1 G capsule Take 1 g by mouth daily.      . omeprazole (PRILOSEC OTC) 20 MG tablet Take 20 mg by mouth daily.      . simvastatin (ZOCOR) 40 MG tablet Take 40 mg by mouth every evening.       No current facility-administered medications for this visit.     Past Medical History  Diagnosis Date  . Hypertension   . Hyperlipidemia   . Myocardial infarction   . Coronary artery disease   . Lung nodules   . Stroke     2007 post cabg   . GERD (gastroesophageal reflux disease)   . Spinal stenosis     reported by wife on 06/29/12- please see note in progress note 06/29/12   . Chronic kidney disease     ROS:   All systems reviewed and negative except as noted in the HPI.   Past Surgical History  Procedure Laterality Date  . Cardiac catheterization      2012  . Coronary artery bypass graft      2007  . Carotid endartarectomy     . Appendectomy    . Abdominal aortic befemoral bypass    . Cholecystectomy     . Herniated intestinal obstruction     . Abdomina lskin graft       2008  . Ventral epigastric hernia repair    . Prostate surgery      x2   . Urethra media plasty     . Green light laser turp (transurethral resection of prostate N/A 07/02/2012    Procedure: GREEN LIGHT LASER TURP (TRANSURETHRAL RESECTION OF PROSTATE;  Surgeon: Sigmund I Tannenbaum, MD;  Location: [facility];  Service: Urology;  Laterality: N/A;     No family history on file.   History   Social History  . Marital Status: Married    Spouse Name: N/A    Number of Children: N/A  . Years of Education: N/A   Occupational History  . Not on file.   Social History Main Topics  . Smoking status: Former Smoker    Quit date: 04/11/1989  . Smokeless tobacco: Never Used  . Alcohol Use: No  . Drug Use: No  . Sexual Activity: Not on file   Other Topics Concern  .   Not on file   Social History Narrative  . No narrative on file     BP 130/78  Pulse 70  Ht 6' (1.829 m)  Wt 168 lb (76.204 kg)  BMI 22.78 kg/m2  Physical Exam:  Well appearing 78yo man, NAD HEENT: Unremarkable Neck:  No JVD, no thyromegally Back:  No CVA tenderness Lungs:  Clear with no wheezes HEART:  Regular rate rhythm, no murmurs, no rubs, no clicks Abd:  soft, positive bowel sounds, no organomegally, no rebound, no guarding Ext:  2 plus pulses, no edema, no cyanosis, no clubbing Skin:  No rashes no nodules Neuro:  CN II through XII intact, motor grossly intact  EKG - NSR with LBBB, QRS 160  Assess/Plan:

## 2013-06-26 ENCOUNTER — Ambulatory Visit (HOSPITAL_COMMUNITY): Payer: Medicare Other

## 2013-06-26 ENCOUNTER — Telehealth: Payer: Self-pay | Admitting: Internal Medicine

## 2013-06-26 ENCOUNTER — Encounter (HOSPITAL_COMMUNITY): Payer: Self-pay | Admitting: *Deleted

## 2013-06-26 DIAGNOSIS — I2589 Other forms of chronic ischemic heart disease: Secondary | ICD-10-CM

## 2013-06-26 NOTE — Discharge Instructions (Addendum)
Supplemental Discharge Instructions for  Pacemaker/Defibrillator Patients  Activity No heavy lifting or vigorous activity with your left/right arm for 6 to 8 weeks.  Do not raise your left/right arm above your head for one week.  Gradually raise your affected arm as drawn below.           03/20                      03/21                       03/22                      03/23       NO DRIVING for 1 week; you may begin driving on 09/98/3382. WOUND CARE   Keep the wound area clean and dry.  Do not get this area wet for one week. No showers for one week; you may shower on 07/04/2013.   The tape/steri-strips on your wound will fall off; do not pull them off.  No bandage is needed on the site.  DO  NOT apply any creams, oils, or ointments to the wound area.   If you notice any drainage or discharge from the wound, any swelling or bruising at the site, or you develop a fever > 101? F after you are discharged home, call the office at once.  Special Instructions   You are still able to use cellular telephones; use the ear opposite the side where you have your pacemaker/defibrillator.  Avoid carrying your cellular phone near your device.   When traveling through airports, show security personnel your identification card to avoid being screened in the metal detectors.  Ask the security personnel to use the hand wand.   Avoid arc welding equipment, MRI testing (magnetic resonance imaging), TENS units (transcutaneous nerve stimulators).  Call the office for questions about other devices.   Avoid electrical appliances that are in poor condition or are not properly grounded.   Microwave ovens are safe to be near or to operate.  Additional information for defibrillator patients should your device go off:   If your device goes off ONCE and you feel fine afterward, notify the device clinic nurses.   If your device goes off ONCE and you do not feel well afterward, call 911.   If your device goes off  TWICE, call 911.   If your device goes off THREE times in one day, call 911.  DO NOT DRIVE YOURSELF OR A FAMILY MEMBER WITH A DEFIBRILLATOR TO THE HOSPITAL--CALL 911.    Cardiac Diet This diet can help prevent heart disease and stroke. Many factors influence your heart health, including eating and exercise habits. Coronary risk rises a lot with abnormal blood fat (lipid) levels. Cardiac meal planning includes limiting unhealthy fats, increasing healthy fats, and making other small dietary changes. General guidelines are as follows:  Adjust calorie intake to reach and maintain desirable body weight.  Limit total fat intake to less than 30% of total calories. Saturated fat should be less than 7% of calories.  Saturated fats are found in animal products and in some vegetable products. Saturated vegetable fats are found in coconut oil, cocoa butter, palm oil, and palm kernel oil. Read labels carefully to avoid these products as much as possible. Use butter in moderation. Choose tub margarines and oils that have 2 grams of fat or less. Good cooking oils are canola  and olive oils.  Practice low-fat cooking techniques. Do not fry food. Instead, broil, bake, boil, steam, grill, roast on a rack, stir-fry, or microwave it. Other fat reducing suggestions include:  Remove the skin from poultry.  Remove all visible fat from meats.  Skim the fat off stews, soups, and gravies before serving them.  Steam vegetables in water or broth instead of sauting them in fat.  Avoid foods with trans fat (or hydrogenated oils), such as commercially fried foods and commercially baked goods. Commercial shortening and deep-frying fats will contain trans fat.  Increase intake of fruits, vegetables, whole grains, and legumes to replace foods high in fat.  Increase consumption of nuts, legumes, and seeds to at least 4 servings weekly. One serving of a legume equals  cup, and 1 serving of nuts or seeds equals   cup.  Choose whole grains more often. Have 3 servings per day (a serving is 1 ounce [oz]).  Eat 4 to 5 servings of vegetables per day. A serving of vegetables is 1 cup of raw leafy vegetables;  cup of raw or cooked cut-up vegetables;  cup of vegetable juice.  Eat 4 to 5 servings of fruit per day. A serving of fruit is 1 medium whole fruit;  cup of dried fruit;  cup of fresh, frozen, or canned fruit;  cup of 100% fruit juice.  Increase your intake of dietary fiber to 20 to 30 grams per day. Insoluble fiber may help lower your risk of heart disease and may help curb your appetite.  Soluble fiber binds cholesterol to be removed from the blood. Foods high in soluble fiber are dried beans, citrus fruits, oats, apples, bananas, broccoli, Brussels sprouts, and eggplant.  Try to include foods fortified with plant sterols or stanols, such as yogurt, breads, juices, or margarines. Choose several fortified foods to achieve a daily intake of 2 to 3 grams of plant sterols or stanols.  Foods with omega-3 fats can help reduce your risk of heart disease. Aim to have a 3.5 oz portion of fatty fish twice per week, such as salmon, mackerel, albacore tuna, sardines, lake trout, or herring. If you wish to take a fish oil supplement, choose one that contains 1 gram of both DHA and EPA.  Limit processed meats to 2 servings (3 oz portion) weekly.  Limit the sodium in your diet to 1500 milligrams (mg) per day. If you have high blood pressure, talk to a registered dietitian about a DASH (Dietary Approaches to Stop Hypertension) eating plan.  Limit sweets and beverages with added sugar, such as soda, to no more than 5 servings per week. One serving is:   1 tablespoon sugar.  1 tablespoon jelly or jam.   cup sorbet.  1 cup lemonade.   cup regular soda. CHOOSING FOODS Starches  Allowed: Breads: All kinds (wheat, rye, raisin, white, oatmeal, [place], [place], and English muffin bread). Low-fat rolls:  English muffins, frankfurter and hamburger buns, bagels, pita bread, tortillas (not fried). Pancakes, waffles, biscuits, and muffins made with recommended oil.  Avoid: Products made with saturated or trans fats, oils, or whole milk products. Butter rolls, cheese breads, croissants. Commercial doughnuts, muffins, sweet rolls, biscuits, waffles, pancakes, store-bought mixes. Crackers  Allowed: Low-fat crackers and snacks: Animal, graham, rye, saltine (with recommended oil, no lard), oyster, and matzo crackers. Bread sticks, melba toast, rusks, flatbread, pretzels, and light popcorn.  Avoid: High-fat crackers: cheese crackers, butter crackers, and those made with coconut, palm oil, or trans fat (hydrogenated oils).  Buttered popcorn. Cereals  Allowed: Hot or cold whole-grain cereals.  Avoid: Cereals containing coconut, hydrogenated vegetable fat, or animal fat. Potatoes / Pasta / Rice  Allowed: All kinds of potatoes, rice, and pasta (such as macaroni, spaghetti, and noodles).  Avoid: Pasta or rice prepared with cream sauce or high-fat cheese. Chow mein noodles, [place] fries. Vegetables  Allowed: All vegetables and vegetable juices.  Avoid: Fried vegetables. Vegetables in cream, butter, or high-fat cheese sauces. Limit coconut. Fruit in cream or custard. Protein  Allowed: Limit your intake of meat, seafood, and poultry to no more than 6 oz (cooked weight) per day. All lean, well-trimmed beef, veal, pork, and lamb. All chicken and [place] without skin. All fish and shellfish. Wild game: wild duck, rabbit, pheasant, and venison. Egg whites or low-cholesterol egg substitutes may be used as desired. Meatless dishes: recipes with dried beans, peas, lentils, and tofu (soybean curd). Seeds and nuts: all seeds and most nuts.  Avoid: Prime grade and other heavily marbled and fatty meats, such as short ribs, spare ribs, rib eye roast or steak, frankfurters, sausage, bacon, and high-fat luncheon meats,  mutton. Caviar. Commercially fried fish. Domestic duck, goose, venison sausage. Organ meats: liver, gizzard, heart, chitterlings, brains, kidney, sweetbreads. Dairy  Allowed: Low-fat cheeses: nonfat or low-fat cottage cheese (1% or 2% fat), cheeses made with part skim milk, such as mozzarella, farmers, string, or ricotta. (Cheeses should be labeled no more than 2 to 6 grams fat per oz.). Skim (or 1%) milk: liquid, powdered, or evaporated. Buttermilk made with low-fat milk. Drinks made with skim or low-fat milk or cocoa. Chocolate milk or cocoa made with skim or low-fat (1%) milk. Nonfat or low-fat yogurt.  Avoid: Whole milk cheeses, including colby, cheddar, mu[place], Monterey Jack, [place], [place], [place], American, Swiss, and blue. Creamed cottage cheese, cream cheese. Whole milk and whole milk products, including buttermilk or yogurt made from whole milk, drinks made from whole milk. Condensed milk, evaporated whole milk, and 2% milk. Soups and Combination Foods  Allowed: Low-fat low-sodium soups: broth, dehydrated soups, homemade broth, soups with the fat removed, homemade cream soups made with skim or low-fat milk. Low-fat spaghetti, lasagna, chili, and Spanish rice if low-fat ingredients and low-fat cooking techniques are used.  Avoid: Cream soups made with whole milk, cream, or high-fat cheese. All other soups. Desserts and Sweets  Allowed: Sherbet, fruit ices, gelatins, meringues, and angel food cake. Homemade desserts with recommended fats, oils, and milk products. Jam, jelly, honey, marmalade, sugars, and syrups. Pure sugar candy, such as gum drops, hard candy, jelly beans, marshmallows, mints, and small amounts of dark chocolate.  Avoid: Commercially prepared cakes, pies, cookies, frosting, pudding, or mixes for these products. Desserts containing whole milk products, chocolate, coconut, lard, palm oil, or palm kernel oil. Ice cream or ice cream drinks. Candy that contains chocolate,  coconut, butter, hydrogenated fat, or unknown ingredients. Buttered syrups. Fats and Oils  Allowed: Vegetable oils: safflower, sunflower, corn, soybean, cottonseed, sesame, canola, olive, or peanut. Non-hydrogenated margarines. Salad dressing or mayonnaise: homemade or commercial, made with a recommended oil. Low or nonfat salad dressing or mayonnaise.  Limit added fats and oils to 6 to 8 tsp per day (includes fats used in cooking, baking, salads, and spreads on bread). Remember to count the "hidden fats" in foods.  Avoid: Solid fats and shortenings: butter, lard, salt pork, bacon drippings. Gravy containing meat fat, shortening, or suet. Cocoa butter, coconut. Coconut oil, palm oil, palm kernel oil, or hydrogenated oils: these ingredients  are often used in bakery products, nondairy creamers, whipped toppings, candy, and commercially fried foods. Read labels carefully. Salad dressings made of unknown oils, sour cream, or cheese, such as blue cheese and Roquefort. Cream, all kinds: half-and-half, light, heavy, or whipping. Sour cream or cream cheese (even if "light" or low-fat). Nondairy cream substitutes: coffee creamers and sour cream substitutes made with palm, palm kernel, hydrogenated oils, or coconut oil. Beverages  Allowed: Coffee (regular or decaffeinated), tea. Diet carbonated beverages, mineral water. Alcohol: Check with your caregiver. Moderation is recommended.  Avoid: Whole milk, regular sodas, and juice drinks with added sugar. Condiments  Allowed: All seasonings and condiments. Cocoa powder. "Cream" sauces made with recommended ingredients.  Avoid: Carob powder made with hydrogenated fats. SAMPLE MENU Breakfast   cup orange juice   cup oatmeal  1 slice toast  1 tsp margarine  1 cup skim milk Lunch  [place] sandwich with 2 oz [place], 2 slices bread  Lettuce and tomato slices  Fresh fruit  Carrot sticks  Coffee or tea Snack  Fresh fruit or low-fat  crackers Dinner  3 oz lean ground beef  1 baked potato  1 tsp margarine   cup asparagus  Lettuce salad  1 tbs non-creamy dressing   cup peach slices  1 cup skim milk Document Released: 01/05/2008 Document Revised: 09/27/2011 Document Reviewed: 06/21/2011 Exit[employer] Patient Information 2014 [place], [place].

## 2013-06-26 NOTE — Telephone Encounter (Signed)
Spoke with patient and explained the transmitter will be scheduled to download once every 3 months and there will be a fee involved with that transmission but not with the daily quick scans.  Patient voiced understanding.

## 2013-06-26 NOTE — Progress Notes (Signed)
UR Completed Lyanna Blystone Graves-Bigelow, RN,BSN [phone]  

## 2013-06-26 NOTE — Progress Notes (Signed)
Patient ID: Gary Ferrell, male   DOB: 05/08/1935, 78 y.o.   MRN: [id number] ICD Criteria  Current LVEF:35% , 2 months ago  NYHA Functional Classification: Class II  Heart Failure History:  Yes, Duration of heart failure since onset is > 9 months  Non-Ischemic Dilated Cardiomyopathy History:  No.  Atrial Fibrillation/Atrial Flutter:  No.  Ventricular Tachycardia History:  No.  Cardiac Arrest History:  No  History of Syndromes with Risk of Sudden Death:  No.  Previous ICD:  No.  Electrophysiology Study: No.  Prior MI: Yes, Most recent MI timeframe is > 40 days.  PPM: No.  OSA:  No  Patient Life Expectancy of >=1 year: Yes.  Anticoagulation Therapy:  Patient is NOT on anticoagulation therapy.   Beta Blocker Therapy:  Yes.   Ace Inhibitor/ARB Therapy:  Yes.

## 2013-06-26 NOTE — Discharge Summary (Signed)
ELECTROPHYSIOLOGY DISCHARGE SUMMARY   Patient ID: Gary Ferrell,  MRN: [id number], DOB/AGE: 17-Sep-1935 78 y.o.  Admit date: 06/25/2013 Discharge date: 06/26/2013  Primary Care Physician: Gaynelle Arabian, MD Primary EP: Cristopher Peru, MD  Primary Discharge Diagnosis:  1. Ischemic CM, chronic systolic HF and LBBB s/p CRT-D implantation  Secondary Discharge Diagnoses:  1. CAD s/p CABG 2. HTN 3. Dyslipidemia 4. Prior CVA (post CABG) 5. Spinal stenosis 6. CKD 7. GERD  Procedures This Admission:  1. Biventricular ICD implantation, defibrillation threshold testing, venography of the coronary sinus and venography of the left upper extremity RA lead - St. Jude model [phone] (serial # [id number]) right atrial lead RV lead - St. Jude Durata single coil (serial # [id number]) right ventricular defibrillator lead  LV lead - St. Jude (serial number [id number]) quadrapolar lead  Device - St. Jude BiV ICD(serial Number [id number])  History and Hospital Course:  TEIGAN SAHLI is a 78 year old man with an ischemic CM (EF 35%), NYHA Class II HF and LBBB. He has had persistent LV dysfunction despite optimal medical therapy. He meets MADIT II and SCD-HeFT criteria for ICD implantation for primary prevention of sudden death. Given LBBB, QRS duration of 160 ms, he is also expected to benefit from resynchronization therapy. He presented yesterday and underwent biventricular ICD implantation. Mr. Saadeh tolerated this procedure well without any immediate complication. He remains hemodynamically stable and afebrile. His chest xray shows stable lead placement without pneumothorax. His device interrogation shows normal BiV ICD function with stable lead parameters/measurements. His implant site is intact without significant bleeding or hematoma. He has been given discharge instructions including wound care and activity restrictions. He will follow-up in 10 days for wound check. There were no changes made to his medications.  Of note, he is on guideline-directed medical therapy with Coreg and enalapril. He has been seen, examined and deemed stable for discharge today by Dr. Cristopher Peru.   Physical Exam: Vitals: Blood pressure 150/58, pulse 60, temperature 97.8 F (36.6 C), temperature source Oral, resp. rate 18, height 6' (1.829 m), weight 169 lb 1.6 oz (76.703 kg), SpO2 97.00%.  General: Well developed, well appearing 78 year old male in no acute distress. Neck: Supple. JVD not elevated. Lungs: Clear bilaterally to auscultation without wheezes, rales, or rhonchi. Breathing is unlabored. Heart: RRR S1 S2 without murmurs, rubs, or gallops.  Abdomen: Soft, non-distended. Extremities: No clubbing or cyanosis. No edema.  Distal pedal pulses are 2+ and equal bilaterally. Neuro: Alert and oriented X 3. Moves all extremities spontaneously. No focal deficits. Skin: Left upper chest / implant site intact without bleeding or hematoma.  Labs: Lab Results  Component Value Date   WBC 3.9 06/17/2013   HGB 13.7 06/17/2013   HCT 40.3 06/17/2013   MCV 90 06/17/2013   PLT 142* 06/18/2012      Component Value Date/Time   NA 142 06/17/2013 1127   NA 142 06/18/2012 1350   K 4.9 06/17/2013 1127   CL 102 06/17/2013 1127   CO2 26 06/17/2013 1127   GLUCOSE 132* 06/17/2013 1127   GLUCOSE 104* 06/18/2012 1350   BUN 14 06/17/2013 1127   BUN 20 06/18/2012 1350   CREATININE 1.01 06/17/2013 1127   CALCIUM 9.4 06/17/2013 1127   GFRNONAA 72 06/17/2013 1127   GFRAA 84 06/17/2013 1127    Disposition:  The patient is being discharged in stable condition.  Follow-up:     Follow-up Information   Follow up with [facility]  On 07/04/2013. (At 9:30 AM for wound check)    Specialty:  Cardiology   Contact information:   [address], [place] [address] [phone]      Follow up with Cristopher Peru, MD On 10/01/2013. (At 9:00 AM)    Specialty:  Cardiology   Contact information:   [address] [address] [phone]      Discharge Medications:    Medication List         aspirin 325 MG tablet  Take 325 mg by mouth daily.     carvedilol 3.125 MG tablet  Commonly known as:  COREG  Take 3.125 mg by mouth 2 (two) times daily with a meal.     cholecalciferol 1000 UNITS tablet  Commonly known as:  VITAMIN D  Take 1,000 Units by mouth daily.     enalapril 2.5 MG tablet  Commonly known as:  VASOTEC  Take 2.5 mg by mouth daily.     methylcellulose 1 % ophthalmic solution  Commonly known as:  ARTIFICIAL TEARS  Place 1-2 drops into both eyes daily as needed (dry eyes).     multivitamin with minerals Tabs tablet  Take 1 tablet by mouth daily.     omega-3 acid ethyl esters 1 G capsule  Commonly known as:  LOVAZA  Take 1 g by mouth daily.     omeprazole 20 MG tablet  Commonly known as:  PRILOSEC OTC  Take 20 mg by mouth daily as needed (acid reflux).     simvastatin 40 MG tablet  Commonly known as:  ZOCOR  Take 40 mg by mouth every evening.       Duration of Discharge Encounter: Greater than 30 minutes including physician time.  Signed, Ileene Hutchinson, PA-C 06/26/2013, 7:31 AM  EP Attending   Patient seen and examined. Agree with above exam, assessment and plan. [place] for discharge. Usual followup.  Mikle Bosworth.D.

## 2013-06-26 NOTE — Telephone Encounter (Signed)
New message      Pt discharged from hosp with a defibulator.  He want to know if the box he has at home if he will be charged per use or is it one flat fee?

## 2013-07-01 ENCOUNTER — Encounter: Payer: Self-pay | Admitting: Vascular Surgery

## 2013-07-02 ENCOUNTER — Encounter: Payer: Self-pay | Admitting: Vascular Surgery

## 2013-07-02 ENCOUNTER — Ambulatory Visit (INDEPENDENT_AMBULATORY_CARE_PROVIDER_SITE_OTHER): Payer: Medicare Other | Admitting: Vascular Surgery

## 2013-07-02 ENCOUNTER — Ambulatory Visit (HOSPITAL_COMMUNITY)
Admission: RE | Admit: 2013-07-02 | Discharge: 2013-07-02 | Disposition: A | Discharge status: Home / Self Care | Payer: Medicare Other | Source: Ambulatory Visit | Attending: Vascular Surgery | Admitting: Vascular Surgery

## 2013-07-02 VITALS — BP 120/68 | HR 65 | Resp 16 | Ht 72.0 in | Wt 170.0 lb

## 2013-07-02 DIAGNOSIS — Z48812 Encounter for surgical aftercare following surgery on the circulatory system: Secondary | ICD-10-CM

## 2013-07-02 DIAGNOSIS — I6529 Occlusion and stenosis of unspecified carotid artery: Secondary | ICD-10-CM | POA: Insufficient documentation

## 2013-07-02 NOTE — Progress Notes (Signed)
Subjective:     Patient ID: Gary Ferrell, male   DOB: 1935/06/21, 78 y.o.   MRN: [id number]  HPI this 78 year old male is seen today in followup regarding his left carotid endarterectomy performed in 1992 and a redo left carotid endarterectomy in 2006 by me. He remains asymptomatic. He denies any lateralizing weakness, aphasia, amaurosis fugax, diplopia, blurred vision, or syncope. He recently had a defibrillator inserted by Dr. Crissie Sickles is doing well from that standpoint. He also underwent aortobifemoral bypass grafting by me in 1992 and denies any lower extremity claudication symptoms currently. 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 He takes 1 aspirin-325 mg tablet per day.  Past Medical History  Diagnosis Date  . Hypertension   . Hyperlipidemia   . Coronary artery disease   . Lung nodules   . GERD (gastroesophageal reflux disease)   . Spinal stenosis     reported by wife on 06/29/12- please see note in progress note 06/29/12   . Chronic kidney disease   . CHF (congestive heart failure)   . Myocardial infarction 2007  . Stroke 2007    denies residual on 06/25/2013    History  Substance Use Topics  . Smoking status: Former Smoker -- 1.00 packs/day for 20 years    Types: Cigarettes    Quit date: 04/11/1989  . Smokeless tobacco: Never Used  . Alcohol Use: No    No family history on file.  Allergies  Allergen Reactions  . Dapsone     REACTION: caused neuropathy/ affected walking  . Gluten Meal Dermatitis    Current outpatient prescriptions:aspirin 325 MG tablet, Take 325 mg by mouth daily., Disp: , Rfl: ;  carvedilol (COREG) 3.125 MG tablet, Take 3.125 mg by mouth 2 (two) times daily with a meal., Disp: , Rfl: ;  cholecalciferol (VITAMIN D) 1000 UNITS tablet, Take 1,000 Units by mouth daily., Disp: , Rfl: ;  enalapril (VASOTEC) 2.5 MG tablet, Take 2.5 mg by mouth daily. , Disp: , Rfl:  methylcellulose  (ARTIFICIAL TEARS) 1 % ophthalmic solution, Place 1-2 drops into both eyes daily as needed (dry eyes)., Disp: , Rfl: ;  Multiple Vitamin (MULTIVITAMIN WITH MINERALS) TABS, Take 1 tablet by mouth daily., Disp: , Rfl: ;  omega-3 acid ethyl esters (LOVAZA) 1 G capsule, Take 1 g by mouth daily., Disp: , Rfl: ;  omeprazole (PRILOSEC OTC) 20 MG tablet, Take 20 mg by mouth daily as needed (acid reflux). , Disp: , Rfl:  simvastatin (ZOCOR) 40 MG tablet, Take 40 mg by mouth every evening., Disp: , Rfl:   BP 120/68  Pulse 65  Resp 16  Ht 6' (1.829 m)  Wt 170 lb (77.111 kg)  BMI 23.05 kg/m2  Body mass index is 23.05 kg/(m^2).           Review of Systems denies chest pain, dyspnea on exertion, PND, orthopnea, hemoptysis, productive cough. Also denies claudication. All systems negative and complete review of systems    Objective:   Physical Exam BP 120/68  Pulse 65  Resp 16  Ht 6' (1.829 m)  Wt 170 lb (77.111 kg)  BMI 23.05 kg/m2  Gen.-alert and oriented x3 in no apparent distress HEENT normal for age Lungs no rhonchi or wheezing-recent surgery for implantable defibrillator left upper chest wall with Steri-Strips in place  Cardiovascular regular rhythm no murmurs carotid pulses 3+ palpable no bruits audible Abdomen soft nontender no palpable masses Musculoskeletal  free of  major deformities Skin clear -no rashes Neurologic normal Lower extremities 3+ femoral and posterior tibial pulses palpable bilaterally with no edema  Today I ordered a carotid duplex exam which I reviewed and interpreted. The redo left carotid endarterectomy site is widely patent with no evidence of significant restenosis. The right internal carotid has an approximate 40-50% stenosis with no significant change from one year ago.       Assessment:     Widely patent redo left carotid endarterectomy site with minimal right ICA stenosis-asymptomatic Widely patent aortobifemoral bypass grafting done in 1992 for  claudication    Plan:     Return in one year with repeat carotid duplex exam unless patient develops symptoms in the interim Continue daily aspirin

## 2013-07-02 NOTE — Addendum Note (Signed)
Addended by: Mena Goes on: 07/02/2013 06:01 PM   Modules accepted: Orders

## 2013-07-04 ENCOUNTER — Ambulatory Visit (INDEPENDENT_AMBULATORY_CARE_PROVIDER_SITE_OTHER): Payer: Medicare Other | Admitting: *Deleted

## 2013-07-04 DIAGNOSIS — I2589 Other forms of chronic ischemic heart disease: Secondary | ICD-10-CM

## 2013-07-04 DIAGNOSIS — I255 Ischemic cardiomyopathy: Secondary | ICD-10-CM

## 2013-07-04 DIAGNOSIS — I5022 Chronic systolic (congestive) heart failure: Secondary | ICD-10-CM

## 2013-07-04 LAB — MDC_IDC_ENUM_SESS_TYPE_INCLINIC
Brady Statistic RV Percent Paced: 99.39 %
Date Time Interrogation Session: 20150326095555
HighPow Impedance: 49.5 Ohm
Implantable Pulse Generator Serial Number: 7178981
Lead Channel Impedance Value: 437.5 Ohm
Lead Channel Pacing Threshold Amplitude: 0.75 V
Lead Channel Pacing Threshold Pulse Width: 0.5 ms
Lead Channel Pacing Threshold Pulse Width: 0.5 ms
Lead Channel Sensing Intrinsic Amplitude: 12 mV
Lead Channel Setting Pacing Amplitude: 2 V
Lead Channel Setting Pacing Amplitude: 2.875
Lead Channel Setting Pacing Pulse Width: 0.5 ms
Lead Channel Setting Sensing Sensitivity: 0.5 mV
MDC IDC MSMT BATTERY REMAINING LONGEVITY: 86.4 mo
MDC IDC MSMT LEADCHNL LV IMPEDANCE VALUE: 700 Ohm
MDC IDC MSMT LEADCHNL LV PACING THRESHOLD AMPLITUDE: 1.875 V
MDC IDC MSMT LEADCHNL LV PACING THRESHOLD PULSEWIDTH: 0.5 ms
MDC IDC MSMT LEADCHNL RA IMPEDANCE VALUE: 425 Ohm
MDC IDC MSMT LEADCHNL RA PACING THRESHOLD AMPLITUDE: 1 V
MDC IDC MSMT LEADCHNL RA SENSING INTR AMPL: 3.8 mV
MDC IDC SET LEADCHNL RA PACING AMPLITUDE: 2 V
MDC IDC SET LEADCHNL RV PACING PULSEWIDTH: 0.5 ms
MDC IDC SET ZONE DETECTION INTERVAL: 315 ms
MDC IDC STAT BRADY RA PERCENT PACED: 12 %
Zone Setting Detection Interval: 270 ms

## 2013-07-04 NOTE — Progress Notes (Signed)

## 2013-07-11 ENCOUNTER — Encounter: Payer: Self-pay | Admitting: Internal Medicine

## 2013-10-01 ENCOUNTER — Encounter: Payer: Self-pay | Admitting: Internal Medicine

## 2013-10-01 ENCOUNTER — Ambulatory Visit (INDEPENDENT_AMBULATORY_CARE_PROVIDER_SITE_OTHER): Payer: Medicare Other | Admitting: Internal Medicine

## 2013-10-01 VITALS — BP 120/50 | HR 61 | Ht 72.0 in | Wt 170.0 lb

## 2013-10-01 DIAGNOSIS — I2589 Other forms of chronic ischemic heart disease: Secondary | ICD-10-CM

## 2013-10-01 DIAGNOSIS — Z95810 Presence of automatic (implantable) cardiac defibrillator: Secondary | ICD-10-CM

## 2013-10-01 DIAGNOSIS — G4710 Hypersomnia, unspecified: Secondary | ICD-10-CM

## 2013-10-01 DIAGNOSIS — I5022 Chronic systolic (congestive) heart failure: Secondary | ICD-10-CM

## 2013-10-01 DIAGNOSIS — G4730 Sleep apnea, unspecified: Secondary | ICD-10-CM

## 2013-10-01 DIAGNOSIS — I255 Ischemic cardiomyopathy: Secondary | ICD-10-CM

## 2013-10-01 DIAGNOSIS — G4733 Obstructive sleep apnea (adult) (pediatric): Secondary | ICD-10-CM | POA: Insufficient documentation

## 2013-10-01 DIAGNOSIS — R400 Somnolence: Secondary | ICD-10-CM

## 2013-10-01 LAB — MDC_IDC_ENUM_SESS_TYPE_INCLINIC
Battery Remaining Longevity: 75.6 mo
Brady Statistic RA Percent Paced: 39 %
Brady Statistic RV Percent Paced: 99.49 %
Date Time Interrogation Session: 20150623100739
HighPow Impedance: 62 Ohm
Lead Channel Impedance Value: 412.5 Ohm
Lead Channel Impedance Value: 462.5 Ohm
Lead Channel Pacing Threshold Amplitude: 0.875 V
Lead Channel Pacing Threshold Amplitude: 1 V
Lead Channel Pacing Threshold Amplitude: 1.5 V
Lead Channel Pacing Threshold Pulse Width: 0.5 ms
Lead Channel Sensing Intrinsic Amplitude: 12 mV
Lead Channel Sensing Intrinsic Amplitude: 5 mV
Lead Channel Setting Pacing Amplitude: 2.5 V
Lead Channel Setting Pacing Pulse Width: 0.5 ms
Lead Channel Setting Sensing Sensitivity: 0.5 mV
MDC IDC MSMT LEADCHNL LV IMPEDANCE VALUE: 350 Ohm
MDC IDC MSMT LEADCHNL LV PACING THRESHOLD AMPLITUDE: 1.5 V
MDC IDC MSMT LEADCHNL LV PACING THRESHOLD PULSEWIDTH: 0.5 ms
MDC IDC MSMT LEADCHNL RA PACING THRESHOLD PULSEWIDTH: 0.5 ms
MDC IDC MSMT LEADCHNL RV PACING THRESHOLD PULSEWIDTH: 0.5 ms
MDC IDC PG SERIAL: 7178981
MDC IDC SET LEADCHNL RA PACING AMPLITUDE: 1.875
MDC IDC SET LEADCHNL RV PACING AMPLITUDE: 2 V
MDC IDC SET LEADCHNL RV PACING PULSEWIDTH: 0.5 ms
MDC IDC SET ZONE DETECTION INTERVAL: 315 ms
Zone Setting Detection Interval: 270 ms

## 2013-10-01 NOTE — Assessment & Plan Note (Signed)
The patient has diaphragmatic stimulation which we were able to reproduce today. We have reprogrammed his LV lead from 2-3 poles to 4 -coil. No diaphragmatic stimulation and the threshold was 1.25 volts. We have reprogrammed his device.

## 2013-10-01 NOTE — Assessment & Plan Note (Signed)
This is a presumptive diagnosis and I have asked him to undergo confirmatory sleep study but he has daytime fatigue, loud snoring and his wife states that he stops breathing at night. Will refer for sleep evaluation.

## 2013-10-01 NOTE — Patient Instructions (Signed)
Your physician wants you to follow-up in:12 months with Dr Knox Saliva will receive a reminder letter in the mail two months in advance. If you don't receive a letter, please call our office to schedule the follow-up appointment.   Remote monitoring is used to monitor your Pacemaker or ICD from home. This monitoring reduces the number of office visits required to check your device to one time per year. It allows [place] to keep an eye on the functioning of your device to ensure it is working properly. You are scheduled for a device check from home on 12/31/13. You may send your transmission at any time that day. If you have a wireless device, the transmission will be sent automatically. After your physician reviews your transmission, you will receive a postcard with your next transmission date.  Your physician has recommended that you have a sleep study. This test records several body functions during sleep, including: brain activity, eye movement, oxygen and carbon dioxide blood levels, heart rate and rhythm, breathing rate and rhythm, the flow of air through your mouth and nose, snoring, body muscle movements, and chest and belly movement.

## 2013-10-01 NOTE — Assessment & Plan Note (Signed)
His symptoms are class 2. No change in medical therapy.  

## 2013-10-01 NOTE — Progress Notes (Signed)
HPI [name] returns today for ongoing ICD followup after undergoing insertion of a BiV ICD several months ago. He has an ischemic CM, s/p CABG in 2007, LBBB, HTN, and dyslipidemia.His EF was 25% by echo in 2012, 35% by echo in 2014, and 35% by echo in 2015. He is on maximal medical therapy. He has class 2 CHF symptoms and a QRS duration of 160 ms with LBBB. He has never had syncope. No peripheral edema. No anginal symptoms. Since his ICD was placed, he has felt tired, snores loudly and has daytime somnolence. He also notes diaphragmatic stimulation. Allergies  Allergen Reactions  . Dapsone     REACTION: caused neuropathy/ affected walking  . Gluten Meal Dermatitis     Current Outpatient Prescriptions  Medication Sig Dispense Refill  . aspirin 325 MG tablet Take 325 mg by mouth daily.      . carvedilol (COREG) 3.125 MG tablet Take 3.125 mg by mouth 2 (two) times daily with a meal.      . cholecalciferol (VITAMIN D) 1000 UNITS tablet Take 1,000 Units by mouth daily.      . enalapril (VASOTEC) 5 MG tablet Take 0.5 tablets by mouth daily.      . methylcellulose (ARTIFICIAL TEARS) 1 % ophthalmic solution Place 1-2 drops into both eyes daily as needed (dry eyes).      . Multiple Vitamin (MULTIVITAMIN WITH MINERALS) TABS Take 1 tablet by mouth daily.      Marland Kitchen omega-3 acid ethyl esters (LOVAZA) 1 G capsule Take 1 g by mouth daily.      Marland Kitchen omeprazole (PRILOSEC OTC) 20 MG tablet Take 20 mg by mouth daily as needed (acid reflux).       . simvastatin (ZOCOR) 40 MG tablet Take 40 mg by mouth every evening. Has not taken in the past few days (10/01/13)       No current facility-administered medications for this visit.     Past Medical History  Diagnosis Date  . Hypertension   . Hyperlipidemia   . Coronary artery disease   . Lung nodules   . GERD (gastroesophageal reflux disease)   . Spinal stenosis     reported by wife on 06/29/12- please see note in progress note 06/29/12   . Chronic  kidney disease   . CHF (congestive heart failure)   . Myocardial infarction 2007  . Stroke 2007    denies residual on 06/25/2013    ROS:   All systems reviewed and negative except as noted in the HPI.   Past Surgical History  Procedure Laterality Date  . Cardiac catheterization  2007; 2012    "unsuccessful attempt to put stents in in 2007, had MI, had to have CABG;"  . Coronary artery bypass graft  2007    "CABG X3" (06/25/2013)  . Carotid endarterectomy Left 1992; 2006  . Appendectomy  1964  . Aorta - bilateral femoral artery bypass graft  1992  . Cholecystectomy  ~ 1996  . Colon surgery  2007    herniated intestinal obstruction  . Skin graft  2008    "area of colon surgery wouldn't heal; had this graft over area"  . Ventral hernia repair  2008    ventral epigastric hernia repair [Other]  . Urethral meatoplasty  08/2009  . Green light laser turp (transurethral resection of prostate N/A 07/02/2012    Procedure: GREEN LIGHT LASER TURP (TRANSURETHRAL RESECTION OF PROSTATE;  Surgeon: Ailene Rud, MD;  Location: [facility];  Service: Urology;  Laterality: N/A;  . Bi-ventricular implantable cardioverter defibrillator  (crt-d)  06/25/2013  . Tonsillectomy    . Urethral stricture dilatation  02/2010; 09/2010  . Bi-ventricular implantable cardioverter defibrillator  (crt-d)  06/25/13    STJ CRTD implanted by Dr Lovena Le     No family history on file.   History   Social History  . Marital Status: Married    Spouse Name: N/A    Number of Children: N/A  . Years of Education: N/A   Occupational History  . Not on file.   Social History Main Topics  . Smoking status: Former Smoker -- 1.00 packs/day for 20 years    Types: Cigarettes    Quit date: 04/11/1989  . Smokeless tobacco: Never Used  . Alcohol Use: No  . Drug Use: No  . Sexual Activity: No   Other Topics Concern  . Not on file   Social History Narrative  . No narrative on file     BP 120/50  Pulse 61  Ht 6'  (1.829 m)  Wt 170 lb (77.111 kg)  BMI 23.05 kg/m2  Physical Exam:  Well appearing 78yo man, NAD HEENT: Unremarkable Neck:  No JVD, no thyromegally Back:  No CVA tenderness Lungs:  Clear with no wheezes HEART:  Regular rate rhythm, no murmurs, no rubs, no clicks Abd:  soft, positive bowel sounds, no organomegally, no rebound, no guarding Ext:  2 plus pulses, no edema, no cyanosis, no clubbing Skin:  No rashes no nodules Neuro:  CN II through XII intact, motor grossly intact  EKG - NSR with LBBB, QRS 160  Assess/Plan:

## 2013-10-09 ENCOUNTER — Telehealth: Payer: Self-pay | Admitting: Cardiology

## 2013-10-09 ENCOUNTER — Encounter: Payer: Self-pay | Admitting: Cardiology

## 2013-10-09 NOTE — Telephone Encounter (Signed)
Pt has never sent a remote transmission. Calling pt to instruct pt to send remote transmission. LMOVM and letter will be mailed.

## 2013-10-14 ENCOUNTER — Encounter: Payer: Self-pay | Admitting: Gastroenterology

## 2013-10-14 ENCOUNTER — Ambulatory Visit (INDEPENDENT_AMBULATORY_CARE_PROVIDER_SITE_OTHER): Payer: Medicare Other | Admitting: Gastroenterology

## 2013-10-14 VITALS — BP 102/54 | HR 68 | Ht 72.0 in | Wt 172.0 lb

## 2013-10-14 DIAGNOSIS — Z860100 Personal history of colon polyps, unspecified: Secondary | ICD-10-CM

## 2013-10-14 DIAGNOSIS — I2589 Other forms of chronic ischemic heart disease: Secondary | ICD-10-CM

## 2013-10-14 DIAGNOSIS — I255 Ischemic cardiomyopathy: Secondary | ICD-10-CM

## 2013-10-14 DIAGNOSIS — G4730 Sleep apnea, unspecified: Secondary | ICD-10-CM

## 2013-10-14 MED ORDER — NA SULFATE-K SULFATE-MG SULF 17.5-3.13-1.6 GM/177ML PO SOLN: 1.0000 | Freq: Once | ORAL | Status: DC

## 2013-10-14 NOTE — Assessment & Plan Note (Signed)
Question of sleep apnea was raised at his last cardiology visit.  Sleep study was recommended.

## 2013-10-14 NOTE — Assessment & Plan Note (Signed)
Plan followup colonoscopy 

## 2013-10-14 NOTE — Progress Notes (Signed)
_                                                                                                                History of Present Illness: 78 year old white male with history of congestive heart failure, coronary artery disease, CVA, colon polyps, here to schedule followup colonoscopy.  A tubulovillous adenoma was removed in 2010.  Patient has no GI complaints including abdominal pain, change in bowel habits or rectal bleeding.  He status post implantable defibrillator/pacemaker.  Last echocardiogram showed an EF of greater than 55%.  He denies breathing difficulty or chest pain.    Past Medical History  Diagnosis Date  . Hypertension   . Hyperlipidemia   . Coronary artery disease   . Lung nodules   . GERD (gastroesophageal reflux disease)   . Spinal stenosis     reported by wife on 06/29/12- please see note in progress note 06/29/12   . Chronic kidney disease   . CHF (congestive heart failure)   . Myocardial infarction 2007  . Stroke 2007    denies residual on 06/25/2013   Past Surgical History  Procedure Laterality Date  . Cardiac catheterization  2007; 2012    "unsuccessful attempt to put stents in in 2007, had MI, had to have CABG;"  . Coronary artery bypass graft  2007    "CABG X3" (06/25/2013)  . Carotid endarterectomy Left 1992; 2006  . Appendectomy  1964  . Aorta - bilateral femoral artery bypass graft  1992  . Cholecystectomy  ~ 1996  . Colon surgery  2007    herniated intestinal obstruction  . Skin graft  2008    "area of colon surgery wouldn't heal; had this graft over area"  . Ventral hernia repair  2008    ventral epigastric hernia repair [Other]  . Urethral meatoplasty  08/2009  . Green light laser turp (transurethral resection of prostate N/A 07/02/2012    Procedure: GREEN LIGHT LASER TURP (TRANSURETHRAL RESECTION OF PROSTATE;  Surgeon: Ailene Rud, MD;  Location: [facility];  Service: Urology;  Laterality: N/A;  . Bi-ventricular implantable  cardioverter defibrillator  (crt-d)  06/25/2013  . Tonsillectomy    . Urethral stricture dilatation  02/2010; 09/2010  . Bi-ventricular implantable cardioverter defibrillator  (crt-d)  06/25/13    STJ CRTD implanted by Dr Lovena Le   family history includes Colon cancer in his maternal grandmother; Diabetes in his brother; Heart disease in his brother; Pancreatic cancer in his father; Parkinson's disease in his mother. Current Outpatient Prescriptions  Medication Sig Dispense Refill  . aspirin 325 MG tablet Take 325 mg by mouth daily.      . carvedilol (COREG) 3.125 MG tablet Take 3.125 mg by mouth 2 (two) times daily with a meal.      . cholecalciferol (VITAMIN D) 1000 UNITS tablet Take 1,000 Units by mouth daily.      . enalapril (VASOTEC) 5 MG tablet Take 0.5 tablets by mouth daily.      . methylcellulose (ARTIFICIAL TEARS)  1 % ophthalmic solution Place 1-2 drops into both eyes daily as needed (dry eyes).      . Multiple Vitamin (MULTIVITAMIN WITH MINERALS) TABS Take 1 tablet by mouth daily.      Marland Kitchen omega-3 acid ethyl esters (LOVAZA) 1 G capsule Take 1 g by mouth daily.      Marland Kitchen omeprazole (PRILOSEC OTC) 20 MG tablet Take 20 mg by mouth daily as needed (acid reflux).        No current facility-administered medications for this visit.   Allergies as of 10/14/2013 - Review Complete 10/14/2013  Allergen Reaction Noted  . Dapsone  06/26/2008  . Gluten meal Dermatitis 06/25/2013    reports that he quit smoking about 24 years ago. His smoking use included Cigarettes. He has a 20 pack-year smoking history. He has never used smokeless tobacco. He reports that he does not drink alcohol or use illicit drugs.     Review of Systems: Pertinent positive and negative review of systems were noted in the above HPI section. All other review of systems were otherwise negative.  Vital signs were reviewed in today's medical record Physical Exam: General: Well developed , well nourished, no acute  distress Skin: anicteric Head: Normocephalic and atraumatic Eyes:  sclerae anicteric, EOMI Ears: Normal auditory acuity Mouth: No deformity or lesions Neck: Supple, no masses or thyromegaly Lungs: Clear throughout to auscultation Heart: Regular rate and rhythm; no murmurs, rubs or bruits Abdomen: Soft, non tender and non distended. No masses, hepatosplenomegaly  noted. Normal Bowel sounds.  There is laxity of the midline of the anterior abdominal wall versus a small hernia Rectal:deferred Musculoskeletal: Symmetrical with no gross deformities  Skin: No lesions on visible extremities Pulses:  Normal pulses noted Extremities: No clubbing, cyanosis, edema or deformities noted Neurological: Alert oriented x 4, grossly nonfocal Cervical Nodes:  No significant cervical adenopathy Inguinal Nodes: No significant inguinal adenopathy Psychological:  Alert and cooperative. Normal mood and affect  See Assessment and Plan under Problem List

## 2013-10-14 NOTE — Patient Instructions (Signed)

## 2013-10-14 NOTE — Assessment & Plan Note (Signed)
If his cardiac function appears stable.

## 2013-10-30 ENCOUNTER — Encounter: Payer: Self-pay | Admitting: Gastroenterology

## 2013-10-30 ENCOUNTER — Ambulatory Visit (AMBULATORY_SURGERY_CENTER): Payer: Medicare Other | Admitting: Gastroenterology

## 2013-10-30 VITALS — BP 139/57 | HR 60 | Temp 98.7°F | Resp 17 | Ht 72.0 in | Wt 172.0 lb

## 2013-10-30 DIAGNOSIS — Z86010 Personal history of colonic polyps: Secondary | ICD-10-CM

## 2013-10-30 DIAGNOSIS — K5730 Diverticulosis of large intestine without perforation or abscess without bleeding: Secondary | ICD-10-CM

## 2013-10-30 MED ORDER — SODIUM CHLORIDE 0.9 % IV SOLN: 500.0000 mL | INTRAVENOUS | Status: DC

## 2013-10-30 NOTE — Op Note (Signed)
[place]  [address] [place], [id number]   COLONOSCOPY PROCEDURE REPORT  PATIENT: Gary, Ferrell  MR#: [id number] BIRTHDATE: October 12, 1935 , 77  yrs. old GENDER: Male ENDOSCOPIST: Inda Castle, MD REFERRED BY: PROCEDURE DATE:  10/30/2013 PROCEDURE:   Colonoscopy, diagnostic First Screening Colonoscopy - Avg.  risk and is 50 yrs.  old or older - No.  Prior Negative Screening - Now for repeat screening. N/A  History of Adenoma - Now for follow-up colonoscopy & has been > or = to 3 yrs.  Yes hx of adenoma.  Has been 3 or more years since last colonoscopy.  Polyps Removed Today? No.  Recommend repeat exam, <10 yrs? No. ASA CLASS:   Class III INDICATIONS:Patient's personal history of colon polyps 2010. MEDICATIONS: MAC sedation, administered by CRNA and propofol (Diprivan) 200mg  IV  DESCRIPTION OF PROCEDURE:   After the risks benefits and alternatives of the procedure were thoroughly explained, informed consent was obtained.  A digital rectal exam revealed no abnormalities of the rectum.   The LB [id number]  endoscope was introduced through the anus and advanced to the cecum, which was identified by both the appendix and ileocecal valve. No adverse events experienced.   The quality of the prep was Suprep good  The instrument was then slowly withdrawn as the colon was fully examined.      COLON FINDINGS: Mild diverticulosis was noted in the sigmoid colon. The colon was otherwise normal.  There was no diverticulosis, inflammation, polyps or cancers unless previously stated. Retroflexed views revealed no abnormalities. The time to cecum=6 minutes 34 seconds.  Withdrawal time=8 minutes 53 seconds.  The scope was withdrawn and the procedure completed. COMPLICATIONS: There were no complications.  ENDOSCOPIC IMPRESSION: 1.   Mild diverticulosis was noted in the sigmoid colon 2.   The colon was otherwise normal  RECOMMENDATIONS: Given your age, you  will not need another colonoscopy for colon cancer screening or polyp surveillance.  These types of tests usually stop around the age 73.   eSigned:  Inda Castle, MD 10/30/2013 4:00 PM   cc:   PATIENT NAME:  Gary, Ferrell MR#: [id number]

## 2013-10-30 NOTE — Progress Notes (Signed)
ON D/C INSTRUCTIONS WE DISCUSSED HIGH FIBER ,PT ON GLUTEN FREE THEREFORE WIFE AND PT UNDERSTOOD SOME FIBER PRODUCTS ARE GLUTEN FREE AND SOME ARE NOT.

## 2013-10-30 NOTE — Patient Instructions (Signed)
YOU HAD AN ENDOSCOPIC PROCEDURE TODAY AT THE [facility]: Refer to the procedure report that was given to you for any specific questions about what was found during the examination.  If the procedure report does not answer your questions, please call your gastroenterologist to clarify.  If you requested that your care partner not be given the details of your procedure findings, then the procedure report has been included in a sealed envelope for you to review at your convenience later.  YOU SHOULD EXPECT: Some feelings of bloating in the abdomen. Passage of more gas than usual.  Walking can help get rid of the air that was put into your GI tract during the procedure and reduce the bloating. If you had a lower endoscopy (such as a colonoscopy or flexible sigmoidoscopy) you may notice spotting of blood in your stool or on the toilet paper. If you underwent a bowel prep for your procedure, then you may not have a normal bowel movement for a few days.  DIET: Your first meal following the procedure should be a light meal and then it is ok to progress to your normal diet.  A half-sandwich or bowl of soup is an example of a good first meal.  Heavy or fried foods are harder to digest and may make you feel nauseous or bloated.  Likewise meals heavy in dairy and vegetables can cause extra gas to form and this can also increase the bloating.  Drink plenty of fluids but you should avoid alcoholic beverages for 24 hours.  ACTIVITY: Your care partner should take you home directly after the procedure.  You should plan to take it easy, moving slowly for the rest of the day.  You can resume normal activity the day after the procedure however you should NOT DRIVE or use heavy machinery for 24 hours (because of the sedation medicines used during the test).    SYMPTOMS TO REPORT IMMEDIATELY: A gastroenterologist can be reached at any hour.  During normal business hours, 8:30 AM to 5:00 PM Monday through Friday,  call [phone].  After hours and on weekends, please call the GI answering service at [phone] who will take a message and have the physician on call contact you.   Following lower endoscopy (colonoscopy or flexible sigmoidoscopy):  Excessive amounts of blood in the stool  Significant tenderness or worsening of abdominal pains  Swelling of the abdomen that is new, acute  Fever of 100F or higher    FOLLOW UP: If any biopsies were taken you will be contacted by phone or by letter within the next 1-3 weeks.  Call your gastroenterologist if you have not heard about the biopsies in 3 weeks.  Our staff will call the home number listed on your records the next business day following your procedure to check on you and address any questions or concerns that you may have at that time regarding the information given to you following your procedure. This is a courtesy call and so if there is no answer at the home number and we have not heard from you through the emergency physician on call, we will assume that you have returned to your regular daily activities without incident.  SIGNATURES/CONFIDENTIALITY: You and/or your care partner have signed paperwork which will be entered into your electronic medical record.  These signatures attest to the fact that that the information above on your After Visit Summary has been reviewed and is understood.  Full responsibility of the confidentiality   of this discharge information lies with you and/or your care-partner.   INFORMATION ON DIVERTICULOSIS & HIGH FIBER GIVEN TO YOU TODAY

## 2013-10-30 NOTE — Progress Notes (Signed)
A/ox3, pleased with MAC, report to RN 

## 2013-10-31 ENCOUNTER — Telehealth: Payer: Self-pay | Admitting: *Deleted

## 2013-10-31 NOTE — Telephone Encounter (Signed)
  Follow up Call-  Call back number 10/30/2013  Post procedure Call Back phone  # [phone]  Permission to leave phone message Yes     Patient questions:  Do you have a fever, pain , or abdominal swelling? No. Pain Score  0 *  Have you tolerated food without any problems? Yes.    Have you been able to return to your normal activities? Yes.    Do you have any questions about your discharge instructions: Diet   No. Medications  No. Follow up visit  No.  Do you have questions or concerns about your Care? No.  Actions: * If pain score is 4 or above: No action needed, pain <4.

## 2013-11-13 ENCOUNTER — Ambulatory Visit (HOSPITAL_BASED_OUTPATIENT_CLINIC_OR_DEPARTMENT_OTHER): Payer: Medicare Other | Attending: Internal Medicine | Admitting: Radiology

## 2013-11-13 DIAGNOSIS — R0683 Snoring: Secondary | ICD-10-CM

## 2013-11-13 DIAGNOSIS — I5022 Chronic systolic (congestive) heart failure: Secondary | ICD-10-CM | POA: Insufficient documentation

## 2013-11-13 DIAGNOSIS — I129 Hypertensive chronic kidney disease with stage 1 through stage 4 chronic kidney disease, or unspecified chronic kidney disease: Secondary | ICD-10-CM | POA: Insufficient documentation

## 2013-11-13 DIAGNOSIS — Z8673 Personal history of transient ischemic attack (TIA), and cerebral infarction without residual deficits: Secondary | ICD-10-CM | POA: Diagnosis not present

## 2013-11-13 DIAGNOSIS — I2510 Atherosclerotic heart disease of native coronary artery without angina pectoris: Secondary | ICD-10-CM | POA: Insufficient documentation

## 2013-11-13 DIAGNOSIS — Z95810 Presence of automatic (implantable) cardiac defibrillator: Secondary | ICD-10-CM | POA: Diagnosis not present

## 2013-11-13 DIAGNOSIS — Z87891 Personal history of nicotine dependence: Secondary | ICD-10-CM | POA: Diagnosis not present

## 2013-11-13 DIAGNOSIS — Z951 Presence of aortocoronary bypass graft: Secondary | ICD-10-CM | POA: Insufficient documentation

## 2013-11-13 DIAGNOSIS — I2589 Other forms of chronic ischemic heart disease: Secondary | ICD-10-CM | POA: Insufficient documentation

## 2013-11-13 DIAGNOSIS — N189 Chronic kidney disease, unspecified: Secondary | ICD-10-CM | POA: Diagnosis not present

## 2013-11-13 DIAGNOSIS — E785 Hyperlipidemia, unspecified: Secondary | ICD-10-CM | POA: Diagnosis not present

## 2013-11-13 DIAGNOSIS — G4730 Sleep apnea, unspecified: Secondary | ICD-10-CM | POA: Diagnosis not present

## 2013-11-13 DIAGNOSIS — Z79899 Other long term (current) drug therapy: Secondary | ICD-10-CM | POA: Diagnosis not present

## 2013-11-13 DIAGNOSIS — I252 Old myocardial infarction: Secondary | ICD-10-CM | POA: Diagnosis not present

## 2013-11-13 DIAGNOSIS — I447 Left bundle-branch block, unspecified: Secondary | ICD-10-CM | POA: Diagnosis not present

## 2013-11-13 DIAGNOSIS — R400 Somnolence: Secondary | ICD-10-CM

## 2013-11-13 DIAGNOSIS — K219 Gastro-esophageal reflux disease without esophagitis: Secondary | ICD-10-CM | POA: Diagnosis not present

## 2013-11-13 DIAGNOSIS — Z7982 Long term (current) use of aspirin: Secondary | ICD-10-CM | POA: Insufficient documentation

## 2013-11-13 DIAGNOSIS — R0681 Apnea, not elsewhere classified: Secondary | ICD-10-CM

## 2013-11-13 DIAGNOSIS — G4710 Hypersomnia, unspecified: Secondary | ICD-10-CM | POA: Diagnosis present

## 2013-11-20 DIAGNOSIS — G4710 Hypersomnia, unspecified: Secondary | ICD-10-CM

## 2013-11-20 DIAGNOSIS — G4730 Sleep apnea, unspecified: Secondary | ICD-10-CM

## 2013-11-20 NOTE — Sleep Study (Signed)
   NAME: Gary Ferrell DATE OF BIRTH:  May 28, 1935 MEDICAL RECORD NUMBER [id number]  [facility]  PHYSICIAN: Kathee Delton  DATE OF STUDY: 11/13/2013  SLEEP STUDY TYPE: Nocturnal Polysomnogram               REFERRING PHYSICIAN: Evans Lance, MD  INDICATION FOR STUDY: Hypersomnia with sleep apnea  EPWORTH SLEEPINESS SCORE:  3 HEIGHT:    WEIGHT:      There is no weight on file to calculate BMI.  NECK SIZE: 16 in.  MEDICATIONS: Reviewed in the sleep record  SLEEP ARCHITECTURE: The patient had a total sleep time of 292 minutes, with no slow-wave sleep and decreased quantity of REM. Sleep onset latency was normal at 25 minutes, and REM onset was normal at 122 minutes. Sleep efficiency was severely reduced at 64% during the diagnostic portion of the study, and 71% during the titration portion.  RESPIRATORY DATA: The patient underwent a split night study where he was found to have 52 obstructive and central events in the first 134 minutes of sleep. This gave him an AHI of 23 events per hour during the diagnostic portion of the study. The events occurred all in the supine position, and there was very loud snoring noted throughout. By protocol the patient was fitted with a large Fisher-Paykel Simplus full face mask, and CPAP titration was initiated. He was found to have an optimal pressure of 17 cm of water, with good control of his events even through supine REM.  OXYGEN DATA: There was oxygen desaturation as low as 86% with the patient's obstructive events  CARDIAC DATA: Frequent PVCs noted throughout  MOVEMENT/PARASOMNIA: The patient had no significant periodic limb movements or other abnormal behaviors.  IMPRESSION/ RECOMMENDATION:    1) split-night study reveals moderate obstructive and central sleep apnea, with an AHI of 23 of events per hour and oxygen desaturation as low as 86% during the diagnostic portion of the study. The patient was then fitted  with a large Fisher-Paykel Simplus full face mask, and found to have an optimal CPAP pressure of 17 cm of water.   2) frequent PVCs noted throughout the night.    [place], American Board of Sleep Medicine  ELECTRONICALLY SIGNED ON:  11/20/2013, 5:22 PM [place] PH: [phone]   FX: [phone] [place]

## 2013-12-25 ENCOUNTER — Telehealth: Payer: Self-pay | Admitting: Internal Medicine

## 2013-12-25 DIAGNOSIS — G4730 Sleep apnea, unspecified: Secondary | ICD-10-CM

## 2013-12-25 NOTE — Telephone Encounter (Signed)
New message      Pt want his sleep study results from august.  Please tomorrow before 9am.

## 2013-12-25 NOTE — Telephone Encounter (Signed)
Gave patient and his wife the results of his sleep study.  I will make a referral to Dr Gwenette Greet for follow up

## 2013-12-31 ENCOUNTER — Encounter: Payer: Self-pay | Admitting: Internal Medicine

## 2013-12-31 ENCOUNTER — Ambulatory Visit (INDEPENDENT_AMBULATORY_CARE_PROVIDER_SITE_OTHER): Payer: Medicare Other | Admitting: *Deleted

## 2013-12-31 DIAGNOSIS — I255 Ischemic cardiomyopathy: Secondary | ICD-10-CM

## 2013-12-31 DIAGNOSIS — I5022 Chronic systolic (congestive) heart failure: Secondary | ICD-10-CM

## 2013-12-31 DIAGNOSIS — I2589 Other forms of chronic ischemic heart disease: Secondary | ICD-10-CM

## 2013-12-31 NOTE — Progress Notes (Signed)
Remote ICD transmission.   

## 2014-01-02 LAB — MDC_IDC_ENUM_SESS_TYPE_REMOTE
Battery Remaining Percentage: 91 %
Battery Voltage: 3.14 V
Brady Statistic AP VP Percent: 48 %
Brady Statistic AP VS Percent: 1 %
Brady Statistic AS VP Percent: 50 %
Brady Statistic AS VS Percent: 1 %
Brady Statistic RA Percent Paced: 47 %
Date Time Interrogation Session: 20150922060019
HighPow Impedance: 61 Ohm
HighPow Impedance: 61 Ohm
Lead Channel Impedance Value: 400 Ohm
Lead Channel Pacing Threshold Amplitude: 0.75 V
Lead Channel Pacing Threshold Amplitude: 1 V
Lead Channel Pacing Threshold Amplitude: 1.5 V
Lead Channel Pacing Threshold Pulse Width: 0.5 ms
Lead Channel Pacing Threshold Pulse Width: 0.5 ms
Lead Channel Sensing Intrinsic Amplitude: 12 mV
Lead Channel Sensing Intrinsic Amplitude: 5 mV
Lead Channel Setting Pacing Amplitude: 1.75 V
Lead Channel Setting Pacing Amplitude: 2 V
Lead Channel Setting Pacing Pulse Width: 0.5 ms
MDC IDC MSMT BATTERY REMAINING LONGEVITY: 74 mo
MDC IDC MSMT LEADCHNL LV IMPEDANCE VALUE: 380 Ohm
MDC IDC MSMT LEADCHNL RA IMPEDANCE VALUE: 460 Ohm
MDC IDC MSMT LEADCHNL RA PACING THRESHOLD PULSEWIDTH: 0.5 ms
MDC IDC PG SERIAL: 7178981
MDC IDC SET LEADCHNL LV PACING AMPLITUDE: 2.5 V
MDC IDC SET LEADCHNL LV PACING PULSEWIDTH: 0.5 ms
MDC IDC SET LEADCHNL RV SENSING SENSITIVITY: 0.5 mV
Zone Setting Detection Interval: 270 ms
Zone Setting Detection Interval: 315 ms

## 2014-01-13 ENCOUNTER — Encounter: Payer: Self-pay | Admitting: Cardiology

## 2014-01-27 ENCOUNTER — Telehealth: Payer: Self-pay | Admitting: Internal Medicine

## 2014-01-27 NOTE — Telephone Encounter (Signed)
New message            Pt has letter from insurance stating they have not received an EOB for visit on 12/31/13

## 2014-01-28 ENCOUNTER — Telehealth: Payer: Self-pay | Admitting: Internal Medicine

## 2014-01-28 NOTE — Telephone Encounter (Signed)
New message           Pt recently had procedure done and is feeling very tired / is this normal

## 2014-01-28 NOTE — Telephone Encounter (Signed)
Spoke with wife/ she states Gary Ferrell has been c/o fatigue like he had when he needed his pacemaker adjusted. Denies pain, no SOB, no other complaints than fatigue. Asked for bp/p reading. Current 146/56 p 60. She was asked to continue to monitor bp/p and to call back with further bp/p concerns. She was advised to F/U with PCP regarding feelings of fatigue, she verbalized understanding and will need for him to establish. his PCP retired but she would make an app with one of the providers that was recommended prior.

## 2014-01-29 ENCOUNTER — Telehealth: Payer: Self-pay | Admitting: Internal Medicine

## 2014-01-29 NOTE — Telephone Encounter (Signed)
New message      Pt states he has not had any energy since his pacemaker insertion in march---please call

## 2014-01-29 NOTE — Telephone Encounter (Signed)
Left message on machine for patient to call me back.  He was asked yesterday to contact his PCP for fatigue

## 2014-01-30 NOTE — Telephone Encounter (Signed)
Let pt know device clinic cannot address EOB/insurance. I did let pt know to contact billing dept  BP currently 59/39

## 2014-01-30 NOTE — Telephone Encounter (Signed)
Follow up ° ° ° ° °Returning a nurses call °

## 2014-01-30 NOTE — Telephone Encounter (Signed)
(  previous note continued; previous BP in note NOT accurate---pt was stating lowest overall numbers)  Pt has had BP range from 108/39 to 153/59. Pt c/o fatigue. Msg forwarded to [facility]. Pt does not have a primary cardiologist.

## 2014-01-31 NOTE — Telephone Encounter (Addendum)
Will try and arrange an appointment for Tues with Gary Ferrell.  Left a message on the machine for the patient

## 2014-02-03 NOTE — Telephone Encounter (Signed)
Left message on machine for patient have asked that he be here tomorrow at 11:00am and bring his home BP monitor with him to calibrate

## 2014-02-03 NOTE — Telephone Encounter (Signed)
F/u    Pt waiting on call from nurse concerning an appt. Please leave a message if not at home. Please call pt.

## 2014-02-04 ENCOUNTER — Encounter: Payer: Self-pay | Admitting: Nurse Practitioner

## 2014-02-04 ENCOUNTER — Ambulatory Visit (INDEPENDENT_AMBULATORY_CARE_PROVIDER_SITE_OTHER): Payer: Medicare Other | Admitting: Nurse Practitioner

## 2014-02-04 VITALS — BP 110/50 | HR 62 | Ht 72.0 in | Wt 169.8 lb

## 2014-02-04 DIAGNOSIS — I255 Ischemic cardiomyopathy: Secondary | ICD-10-CM | POA: Insufficient documentation

## 2014-02-04 DIAGNOSIS — I5022 Chronic systolic (congestive) heart failure: Secondary | ICD-10-CM | POA: Insufficient documentation

## 2014-02-04 DIAGNOSIS — I779 Disorder of arteries and arterioles, unspecified: Secondary | ICD-10-CM | POA: Insufficient documentation

## 2014-02-04 DIAGNOSIS — R5382 Chronic fatigue, unspecified: Secondary | ICD-10-CM

## 2014-02-04 DIAGNOSIS — I2589 Other forms of chronic ischemic heart disease: Secondary | ICD-10-CM

## 2014-02-04 DIAGNOSIS — E785 Hyperlipidemia, unspecified: Secondary | ICD-10-CM | POA: Insufficient documentation

## 2014-02-04 DIAGNOSIS — I2510 Atherosclerotic heart disease of native coronary artery without angina pectoris: Secondary | ICD-10-CM

## 2014-02-04 DIAGNOSIS — I10 Essential (primary) hypertension: Secondary | ICD-10-CM

## 2014-02-04 DIAGNOSIS — I739 Peripheral vascular disease, unspecified: Secondary | ICD-10-CM | POA: Insufficient documentation

## 2014-02-04 LAB — BASIC METABOLIC PANEL
BUN: 18 mg/dL (ref 6–23)
CHLORIDE: 103 meq/L (ref 96–112)
CO2: 31 mEq/L (ref 19–32)
CREATININE: 1 mg/dL (ref 0.4–1.5)
Calcium: 9.1 mg/dL (ref 8.4–10.5)
GFR: 73.8 mL/min (ref >60.00)
GLUCOSE: 81 mg/dL (ref 70–99)
Potassium: 4.6 mEq/L (ref 3.5–5.1)
Sodium: 138 mEq/L (ref 135–145)

## 2014-02-04 LAB — CBC
HCT: 41.4 % (ref 39.0–52.0)
Hemoglobin: 13.7 g/dL (ref 13.0–17.0)
MCHC: 33.1 g/dL (ref 30.0–36.0)
MCV: 92.3 fl (ref 78.0–100.0)
Platelets: 127 10*3/uL — ABNORMAL LOW (ref 150.0–400.0)
RBC: 4.49 Mil/uL (ref 4.22–5.81)
RDW: 13.8 % (ref 11.5–15.5)
WBC: 4.6 10*3/uL (ref 4.0–10.5)

## 2014-02-04 LAB — TSH: TSH: 0.5 u[IU]/mL (ref 0.35–4.50)

## 2014-02-04 NOTE — Progress Notes (Signed)
Patient Name: Gary Ferrell Date of Encounter: 02/04/2014  Primary Care Provider:  Ailene Rud, MD Primary Cardiologist:  Darnell Level. Lovena Le, MD   Patient Profile  78 y/o male with a h/o CAD, PAD, ICM, and chronic systolic CHF, who presents with persistent fatigue.  Problem List   Past Medical History  Diagnosis Date  . Hypertension   . Hyperlipidemia   . Coronary artery disease     a. 2007 s/p MI & CABG.  . Lung nodules   . GERD (gastroesophageal reflux disease)   . Spinal stenosis   . CKD (chronic kidney disease), stage II   . Chronic systolic CHF (congestive heart failure)     a. 04/2013 Echo: EF 35%, inf DK, inflat AK, diast dysfxn, RV HK, mild MR, nmildly dil LA.  . Stroke     a. 2007 Periop CABG - no residual.  . Ischemic cardiomyopathy     a. 04/2013 Echo: EF 35%;  b. 06/2013 s/p SJM Bi-V ICD, ser # [id number].  Marland Kitchen PAD (peripheral artery disease)     a. 1990's s/p Aortobifem bypass.  . Carotid disease, bilateral     a. 1990's s/p L CEA;  b. 11/2004 s/p redo L CEA;  c. 06/2013 Carotid U/S: RICA 62-70%, LICA patent.  Marland Kitchen BPH (benign prostatic hyperplasia)     a. 06/2012 s/p Greenlight Laser Prostatectomy.  . Stricture of urethral meatus     a. 08/2009 s/p dil.  Marland Kitchen LBBB (left bundle branch block)   . Diverticulosis     a. 10/2013 colonoscopy (otw nl study).   Past Surgical History  Procedure Laterality Date  . Cardiac catheterization  2007; 2012    "unsuccessful attempt to put stents in in 2007, had MI, had to have CABG;"  . Coronary artery bypass graft  2007    "CABG X3" (06/25/2013)  . Carotid endarterectomy Left 1992; 2006  . Appendectomy  1964  . Aorta - bilateral femoral artery bypass graft  1992  . Cholecystectomy  ~ 1996  . Colon surgery  2007    herniated intestinal obstruction  . Skin graft  2008    "area of colon surgery wouldn't heal; had this graft over area"  . Ventral hernia repair  2008    ventral epigastric hernia repair [Other]  . Urethral  meatoplasty  08/2009  . Green light laser turp (transurethral resection of prostate N/A 07/02/2012    Procedure: GREEN LIGHT LASER TURP (TRANSURETHRAL RESECTION OF PROSTATE;  Surgeon: Ailene Rud, MD;  Location: [facility];  Service: Urology;  Laterality: N/A;  . Bi-ventricular implantable cardioverter defibrillator  (crt-d)  06/25/2013  . Tonsillectomy    . Urethral stricture dilatation  02/2010; 09/2010  . Bi-ventricular implantable cardioverter defibrillator  (crt-d)  06/25/13    STJ CRTD implanted by Dr Lovena Le    Allergies  Allergies  Allergen Reactions  . Dapsone     REACTION: caused neuropathy/ affected walking  . Gluten Meal Dermatitis    HPI  78 y/o male with the above complex problem list.  He is s/p CABG in 2007 with subsequent ICM and chronic systolic CHF, with an EF of 35% by echo in January of 2015.  In March, he underwent BiV ICD placement by Dr. Lovena Le and he was last seen in June.  He c/o fatigue @ that time along with daytime somnolence and snoring.  He was noted to have diaphragmatic stimulation on his device interrogation and adjustments were made @ that time.  He  subsequently underwent a sleep study in Sept, revealing severe sleep apnea.  He has f/u with Dr. Gwenette Greet on 11/2 and has not yet been placed on CPAP.    Unfortunately, he continues to note significant daytime fatigue and overall low energy.  Though it takes him awhile to get motivated to do something, he has not had any exercise intolerance, chest pain, or dyspnea with activity.  He is able to push mow is 1/2 acre yard w/o symptoms or limitations.  Prior anginal equivalent was vague left arm pain, and he has not had any of this.  He reports a good appetite and steady wt and denies pnd, orthopnea, n, v, dizziness, syncope, or early satiety.  Home Medications  Prior to Admission medications   Medication Sig Start Date End Date Taking? Authorizing Provider  aspirin 325 MG tablet Take 325 mg by mouth daily.   Yes  Historical Provider, MD  carvedilol (COREG) 3.125 MG tablet Take 3.125 mg by mouth 2 (two) times daily with a meal.   Yes Historical Provider, MD  cholecalciferol (VITAMIN D) 1000 UNITS tablet Take 1,000 Units by mouth daily.   Yes Historical Provider, MD  enalapril (VASOTEC) 5 MG tablet Take 0.5 tablets by mouth daily. 05/06/13  Yes Historical Provider, MD  methylcellulose (ARTIFICIAL TEARS) 1 % ophthalmic solution Place 1-2 drops into both eyes daily as needed (dry eyes).   Yes Historical Provider, MD  Multiple Vitamin (MULTIVITAMIN WITH MINERALS) TABS Take 1 tablet by mouth daily.   Yes Historical Provider, MD  omega-3 acid ethyl esters (LOVAZA) 1 G capsule Take 1 g by mouth daily.   Yes Historical Provider, MD  omeprazole (PRILOSEC OTC) 20 MG tablet Take 20 mg by mouth daily as needed (acid reflux).    Yes Historical Provider, MD   Review of Systems  Persistent fatigue as above.  He denies chest pain, palpitations, dyspnea, pnd, orthopnea, n, v, dizziness, syncope, edema, weight gain, or early satiety.  All other systems reviewed and are otherwise negative except as noted above.  Physical Exam  Blood pressure 110/50, height 6' (1.829 m), weight 169 lb 12.8 oz (77.021 kg).  General: Pleasant, NAD Psych: Normal affect. Neuro: Alert and oriented X 3. Moves all extremities spontaneously. HEENT: Normal  Neck: Supple without JVD.  Soft R carotid bruit. Lungs:  Resp regular and unlabored, CTA. Heart: RRR no s3, s4, or murmurs. Abdomen: Soft, non-tender, non-distended, BS + x 4.  Extremities: No clubbing, cyanosis or edema. DP/PT/Radials 1+ and equal bilaterally.  Accessory Clinical Findings  ECG - AV paced, pvc.  Assessment & Plan  1.  Chronic Fatigue:  Pt has been experiencing fatigue since his BiV ICD was placed in March.  Despite generalized low energy state, he has not had limitations or changes in exercise tolerance and denies pnd, orthopnea, doe, c/p, or edema.  His wt has been  steady.  He does snore and had a sleep study last month revealing severe OSA and he has f/u with Dr. Gwenette Greet on 11/2.  He is not yet on CPAP.  I will check a cbc, bmet, and TSH today.  I've discussed his case with Dr. Lovena Le and his LV lead appears to be working optimally based on ECG.  If labs are unrevealing and pt does not experience improvement in fatigue following initiation of CPAP, we will need to consider repeat echo and stress testing.  He cannot remember when his last stress test was but believes that it was several years ago.  2.  Chronic systolic CHF/ICM:  EF 69% by echo in January.  See #1.  Fatigue w/o evidence of volume overload or other Ss of CHF.  Consider repeat echo if fatigue is unchanged despite adequate tx of OSA.  Cont bb, acei.  3. CAD:  S/P CABG in 2007.  Prior anginal equiv was left arm pain and he has not been experiencing this.  He is able to mow his lawn w/o limitations.  Cont asa, bb.  ? Not on statin.  As above, if fatigue persists depsite tx of OSA, we will need to consider stress testing.  4.  HTN:  Stable on bb/acei.  5.  HL:  Not clear as to why he is not on statin.  6.  PAD/Carotid dzs:  Followed by Dr. Kellie Simmering.  45-03% RICA/nl LICA in March.  Cont asa.  7.  Dispo:  CBC, BMET, TSH today.  F/U Dr. Lovena Le in 4 wks.  Pt will call if he is feeling better once CPAP started.  Murray Hodgkins, NP 02/04/2014, 11:33 AM

## 2014-02-04 NOTE — Patient Instructions (Signed)
Your physician recommends that you continue on your current medications as directed. Please refer to the Current Medication list given to you today.   Your physician recommends that you return for lab work in: BMET [place]      Your physician recommends that you schedule a follow-up appointment in [place] [facility]

## 2014-02-10 ENCOUNTER — Ambulatory Visit (INDEPENDENT_AMBULATORY_CARE_PROVIDER_SITE_OTHER): Payer: Medicare Other | Admitting: Pulmonary Disease

## 2014-02-10 ENCOUNTER — Encounter: Payer: Self-pay | Admitting: Pulmonary Disease

## 2014-02-10 DIAGNOSIS — G4733 Obstructive sleep apnea (adult) (pediatric): Secondary | ICD-10-CM

## 2014-02-10 NOTE — Progress Notes (Signed)
Subjective:    Patient ID: Gary Ferrell, male    DOB: 1935/06/20, 78 y.o.   MRN: [id number]  HPI The patient is a 78 year old male who I've been asked to see for management of obstructive sleep apnea. He has had a sleep study in August of this year, that showed an AHI of 23 events per hour and good response to a CPAP pressure of 17 cm of water.  He tells me that he does have loud snoring, and his wife has noticed an abnormal breathing pattern during sleep. He denies frequent awakenings, and feels adequately rested in the mornings upon arising. However, he does note intermittent sleep pressure during the day with periods of inactivity, and this is definitely worse at night while trying to watch television. He primarily complains of severe fatigue, and this has become a problem more recently. He denies any sleepiness issues with driving. Patient states that his weight is stable over the last few years, and his Epworth score today is 9.   Sleep Questionnaire What time do you typically go to bed?( Between what hours) 1AM 1AM at 1358 on 02/10/14 by Inge Rise, CMA How long does it take you to fall asleep? 3-4 min 3-4 min at 1358 on 02/10/14 by Inge Rise, CMA How many times during the night do you wake up? 1 1 at 1358 on 02/10/14 by Inge Rise, CMA What time do you get out of bed to start your day? 0830 0830 at 1358 on 02/10/14 by Inge Rise, CMA Do you drive or operate heavy machinery in your occupation? No No at 1358 on 02/10/14 by Inge Rise, CMA How much has your weight changed (up or down) over the past two years? (In pounds) 3 lb (1.361 kg) 3 lb (1.361 kg) at 1358 on 02/10/14 by Inge Rise, CMA Have you ever had a sleep study before? Yes Yes at 1358 on 02/10/14 by Inge Rise, CMA If yes, location of study? [facility] [place] at 1358 on 02/10/14 by Inge Rise, CMA If yes, date of study? 11/13/13 11/13/13 at 1358 on 02/10/14 by Inge Rise, CMA Do you currently use  CPAP? No No at 1358 on 02/10/14 by Inge Rise, CMA Do you wear oxygen at any time? No No at 1358 on 02/10/14 by Inge Rise, CMA   Review of Systems  Constitutional: Negative for fever and unexpected weight change.  HENT: Negative for congestion, dental problem, ear pain, nosebleeds, postnasal drip, rhinorrhea, sinus pressure, sneezing, sore throat and trouble swallowing.   Eyes: Negative for redness and itching.  Respiratory: Negative for cough, chest tightness, shortness of breath and wheezing.   Cardiovascular: Negative for palpitations and leg swelling.  Gastrointestinal: Negative for nausea and vomiting.  Genitourinary: Negative for dysuria.  Musculoskeletal: Negative for joint swelling.  Skin: Negative for rash.  Neurological: Negative for headaches.  Hematological: Does not bruise/bleed easily.  Psychiatric/Behavioral: Negative for dysphoric mood. The patient is not nervous/anxious.        Objective:   Physical Exam Constitutional:  Well developed, no acute distress  HENT:  Nares patent without discharge, mild septal deviation to the left.   Oropharynx without exudate, palate and uvula are normal  Eyes:  Perrla, eomi, no scleral icterus  Neck:  No JVD, no TMG  Cardiovascular:  Normal rate, regular rhythm, no rubs or gallops.  No murmurs        Intact distal pulses  Pulmonary :  Normal breath sounds, no stridor or respiratory distress   No rales, rhonchi, or wheezing  Abdominal:  Soft, nondistended, bowel sounds present.  No tenderness noted.   Musculoskeletal:  No lower extremity edema noted.  Lymph Nodes:  No cervical lymphadenopathy noted  Skin:  No cyanosis noted  Neurologic:  Alert, appropriate, moves all 4 extremities without obvious deficit.         Assessment & Plan:

## 2014-02-10 NOTE — Patient Instructions (Signed)
Will start on cpap at a moderate pressure level.  Please call if you are having issues with tolerance followup with me again in 8 weeks.

## 2014-02-10 NOTE — Assessment & Plan Note (Signed)
The patient has moderate obstructive sleep apnea with a few centrals noted, and was well-controlled on a sleep apnea 17 cm of water. I had a long discussion with him about sleep apnea, including its impact to quality of life and cardiovascular health. It is really unclear if this is the cause of his "fatigue", but I do think it is worth a trial of CPAP to see if he has a good response. I also discussed with him possibly trying a dental appliance, but I think this is a second line therapy given the degree of sleep apnea. I will set the patient up on cpap at a moderate pressure level to allow for desensitization, and will troubleshoot the device over the next 4-6weeks if needed.  The pt is to call me if having issues with tolerance.  Will then optimize the pressure once patient is able to wear cpap on a consistent basis.

## 2014-02-14 ENCOUNTER — Telehealth: Payer: Self-pay | Admitting: Pulmonary Disease

## 2014-02-14 NOTE — Telephone Encounter (Signed)
Called and spoke with Darlina Guys at [facility] and advised that the [facility] is contacting patient instead of the [facility] where I sent the order. Melissa with [facility] stated that she will contact the office and have them have this order changed from the [facility] to the [facility]. I advised Melissa that [place] was much closer to [facility] than [place] and I didn't want the patient to think that I sent it to the wrong branch when I clearly stated for this patient to be set up at [facility]. Rhonda J Cobb

## 2014-02-14 NOTE — Telephone Encounter (Signed)
Called and spoke with pt's wife and advised that [place] with [facility] has pulled order from the [facility] and will send to [facility] for them to contact patient to arrange for CPAP set. Wife advised that if she didn't hear from the [facility] to call us and let us know.  Nothing else needed at this time. Rhonda J Cobb

## 2014-03-05 ENCOUNTER — Ambulatory Visit (INDEPENDENT_AMBULATORY_CARE_PROVIDER_SITE_OTHER): Payer: Medicare Other | Admitting: Internal Medicine

## 2014-03-05 ENCOUNTER — Encounter: Payer: Self-pay | Admitting: Internal Medicine

## 2014-03-05 VITALS — BP 106/40 | HR 60 | Ht 72.0 in | Wt 168.2 lb

## 2014-03-05 DIAGNOSIS — Z95810 Presence of automatic (implantable) cardiac defibrillator: Secondary | ICD-10-CM

## 2014-03-05 DIAGNOSIS — I2589 Other forms of chronic ischemic heart disease: Secondary | ICD-10-CM

## 2014-03-05 DIAGNOSIS — I5022 Chronic systolic (congestive) heart failure: Secondary | ICD-10-CM

## 2014-03-05 DIAGNOSIS — I255 Ischemic cardiomyopathy: Secondary | ICD-10-CM

## 2014-03-05 LAB — MDC_IDC_ENUM_SESS_TYPE_INCLINIC
Date Time Interrogation Session: 20151125094635
HighPow Impedance: 62 Ohm
Implantable Pulse Generator Serial Number: 7178981
Lead Channel Impedance Value: 375 Ohm
Lead Channel Impedance Value: 462.5 Ohm
Lead Channel Pacing Threshold Amplitude: 0.875 V
Lead Channel Pacing Threshold Amplitude: 1.125 V
Lead Channel Pacing Threshold Amplitude: 1.5 V
Lead Channel Pacing Threshold Amplitude: 1.5 V
Lead Channel Pacing Threshold Pulse Width: 0.8 ms
Lead Channel Sensing Intrinsic Amplitude: 12 mV
Lead Channel Sensing Intrinsic Amplitude: 5 mV
Lead Channel Setting Pacing Amplitude: 1.875
Lead Channel Setting Pacing Amplitude: 2.125
Lead Channel Setting Pacing Amplitude: 2.5 V
Lead Channel Setting Pacing Pulse Width: 0.7 ms
Lead Channel Setting Sensing Sensitivity: 0.5 mV
MDC IDC MSMT BATTERY REMAINING LONGEVITY: 66 mo
MDC IDC MSMT LEADCHNL LV PACING THRESHOLD PULSEWIDTH: 0.8 ms
MDC IDC MSMT LEADCHNL RA PACING THRESHOLD PULSEWIDTH: 0.5 ms
MDC IDC MSMT LEADCHNL RV IMPEDANCE VALUE: 375 Ohm
MDC IDC MSMT LEADCHNL RV PACING THRESHOLD PULSEWIDTH: 0.5 ms
MDC IDC SET LEADCHNL RV PACING PULSEWIDTH: 0.5 ms
MDC IDC SET ZONE DETECTION INTERVAL: 315 ms
MDC IDC STAT BRADY RA PERCENT PACED: 49 %
MDC IDC STAT BRADY RV PERCENT PACED: 98 %
Zone Setting Detection Interval: 270 ms

## 2014-03-05 NOTE — Assessment & Plan Note (Signed)
His St. Jude device is working normally. We have reprogrammed biventricular pacing as previously noted. We'll plan to recheck in several months.

## 2014-03-05 NOTE — Assessment & Plan Note (Signed)
His symptoms remain class IIb. He is uncertain whether C Pap has helped. We have adjusted his device as optimally as possible. At this point, we discussed the treatment options. I have turned biV pacing off, and left him back up VVI pacing at 40 bpm. If he feels poorly, he is instructed to call us and we can resume biventricular pacing. If he feels better, he is instructed to call us. I will see him back in 3 months. He will continue his current medications, and maintain a low-sodium diet.

## 2014-03-05 NOTE — Assessment & Plan Note (Signed)
He denies anginal symptoms. He will continue his current medical therapy. 

## 2014-03-05 NOTE — Progress Notes (Signed)
HPI Mr. Dittmar returns today for ongoing ICD followup after undergoing insertion of a BiV ICD several months ago. He has an ischemic CM, s/p CABG in 2007, LBBB, HTN, and dyslipidemia.His EF was 25% by echo in 2012, 35% by echo in 2014, and 35% by echo in 2015. He is on maximal medical therapy. He has class 2 CHF symptoms and a QRS duration of 160 ms with LBBB. He was diagnosed with severe sleep apnea, and has begun to wear C Pap. He does not think he feels much better at this point. He states that he can work in the yard but does not have much energy. No ICD shocks. Allergies  Allergen Reactions  . Dapsone Other (See Comments)    REACTION: caused neuropathy/ affected walking  . Gluten Meal Dermatitis    Dermatitis      Current Outpatient Prescriptions  Medication Sig Dispense Refill  . aspirin 325 MG tablet Take 325 mg by mouth daily.    . carvedilol (COREG) 3.125 MG tablet Take 3.125 mg by mouth 2 (two) times daily with a meal.    . cholecalciferol (VITAMIN D) 1000 UNITS tablet Take 1,000 Units by mouth daily.    . enalapril (VASOTEC) 5 MG tablet Take 0.5 tablets by mouth daily.    . methylcellulose (ARTIFICIAL TEARS) 1 % ophthalmic solution Place 1-2 drops into both eyes daily as needed (dry eyes).    . Multiple Vitamin (MULTIVITAMIN WITH MINERALS) TABS Take 1 tablet by mouth daily.    Marland Kitchen omega-3 acid ethyl esters (LOVAZA) 1 G capsule Take 1 g by mouth daily.    Marland Kitchen omeprazole (PRILOSEC OTC) 20 MG tablet Take 20 mg by mouth daily as needed (acid reflux).      No current facility-administered medications for this visit.     Past Medical History  Diagnosis Date  . Hypertension   . Hyperlipidemia   . Coronary artery disease     a. 2007 s/p MI & CABG.  . Lung nodules   . GERD (gastroesophageal reflux disease)   . Spinal stenosis   . CKD (chronic kidney disease), stage II   . Chronic systolic CHF (congestive heart failure)     a. 04/2013 Echo: EF 35%, inf DK, inflat AK, diast  dysfxn, RV HK, mild MR, nmildly dil LA.  . Stroke     a. 2007 Periop CABG - no residual.  . Ischemic cardiomyopathy     a. 04/2013 Echo: EF 35%;  b. 06/2013 s/p SJM Bi-V ICD, ser # [id number].  Marland Kitchen PAD (peripheral artery disease)     a. 1990's s/p Aortobifem bypass.  . Carotid disease, bilateral     a. 1990's s/p L CEA;  b. 11/2004 s/p redo L CEA;  c. 06/2013 Carotid U/S: RICA 77-93%, LICA patent.  Marland Kitchen BPH (benign prostatic hyperplasia)     a. 06/2012 s/p Greenlight Laser Prostatectomy.  . Stricture of urethral meatus     a. 08/2009 s/p dil.  Marland Kitchen LBBB (left bundle branch block)   . Diverticulosis     a. 10/2013 colonoscopy (otw nl study).  . OSA (obstructive sleep apnea)     a. 12/2013 Sleep study: severe OSA - pending CPAP placement.    ROS:   All systems reviewed and negative except as noted in the HPI.   Past Surgical History  Procedure Laterality Date  . Cardiac catheterization  2007; 2012    "unsuccessful attempt to put stents in in 2007, had MI, had  to have CABG;"  . Coronary artery bypass graft  2007    "CABG X3" (06/25/2013)  . Carotid endarterectomy Left 1992; 2006  . Appendectomy  1964  . Aorta - bilateral femoral artery bypass graft  1992  . Cholecystectomy  ~ 1996  . Colon surgery  2007    herniated intestinal obstruction  . Skin graft  2008    "area of colon surgery wouldn't heal; had this graft over area"  . Ventral hernia repair  2008    ventral epigastric hernia repair [Other]  . Urethral meatoplasty  08/2009  . Green light laser turp (transurethral resection of prostate N/A 07/02/2012    Procedure: GREEN LIGHT LASER TURP (TRANSURETHRAL RESECTION OF PROSTATE;  Surgeon: Ailene Rud, MD;  Location: [facility];  Service: Urology;  Laterality: N/A;  . Bi-ventricular implantable cardioverter defibrillator  (crt-d)  06/25/2013  . Tonsillectomy    . Urethral stricture dilatation  02/2010; 09/2010  . Bi-ventricular implantable cardioverter defibrillator  (crt-d)  06/25/13    STJ  CRTD implanted by Dr Lovena Le     Family History  Problem Relation Age of Onset  . Pancreatic cancer Father   . Diabetes Brother   . Heart disease Brother   . Colon cancer Maternal Grandmother   . Parkinson's disease Mother   . Lung cancer Brother     stage 4     History   Social History  . Marital Status: Married    Spouse Name: N/A    Number of Children: 1  . Years of Education: N/A   Occupational History  . Retired    Social History Main Topics  . Smoking status: Former Smoker -- 1.00 packs/day for 20 years    Types: Cigarettes    Quit date: 04/11/1989  . Smokeless tobacco: Never Used  . Alcohol Use: No  . Drug Use: No  . Sexual Activity: No   Other Topics Concern  . Not on file   Social History Narrative     BP 106/40 mmHg  Pulse 60  Ht 6' (1.829 m)  Wt 168 lb 3.2 oz (76.295 kg)  BMI 22.81 kg/m2  Physical Exam:  Stable appearing 78yo man, NAD HEENT: Unremarkable Neck:  No JVD, no thyromegally Back:  No CVA tenderness Lungs:  Clear with no wheezes HEART:  Regular rate rhythm, no murmurs, no rubs, no clicks Abd:  soft, positive bowel sounds, no organomegally, no rebound, no guarding Ext:  2 plus pulses, no edema, no cyanosis, no clubbing Skin:  No rashes no nodules Neuro:  CN II through XII intact, motor grossly intact   Assess/Plan:

## 2014-03-05 NOTE — Patient Instructions (Signed)
Your physician recommends that you schedule a follow-up appointment in:  3 months with Dr Taylor  Your physician recommends that you continue on your current medications as directed. Please refer to the Current Medication list given to you today.    

## 2014-03-10 ENCOUNTER — Other Ambulatory Visit: Payer: Self-pay | Admitting: Internal Medicine

## 2014-03-19 ENCOUNTER — Ambulatory Visit (INDEPENDENT_AMBULATORY_CARE_PROVIDER_SITE_OTHER): Payer: Medicare Other | Admitting: Pulmonary Disease

## 2014-03-19 ENCOUNTER — Encounter (INDEPENDENT_AMBULATORY_CARE_PROVIDER_SITE_OTHER): Payer: Self-pay

## 2014-03-19 ENCOUNTER — Encounter: Payer: Self-pay | Admitting: Pulmonary Disease

## 2014-03-19 VITALS — BP 116/68 | HR 56 | Temp 97.8°F | Ht 72.0 in | Wt 171.6 lb

## 2014-03-19 DIAGNOSIS — G4733 Obstructive sleep apnea (adult) (pediatric): Secondary | ICD-10-CM

## 2014-03-19 DIAGNOSIS — I2589 Other forms of chronic ischemic heart disease: Secondary | ICD-10-CM

## 2014-03-19 NOTE — Progress Notes (Signed)
   Subjective:    Patient ID: Gary Ferrell, male    DOB: 03/31/1936, 78 y.o.   MRN: [id number]  HPI The patient comes in today for follow-up of his obstructive sleep apnea. He also has significant cardiac disease with an ischemic cardiomyopathy and chronic congestive heart failure. I don't see Pap at the last visit with the automatic setting, and has been doing fairly well with the device. He has been having some issues with mask fit, but is trying different types. He believes the device is very inconvenient, but is willing to continue working with it. His download shows significant breakthrough apnea at his current pressure range, and this will obviously have to be widened. He has not noticed a significant improvement in his sleep or daytime alertness as of yet. His download also shows fairly good compliance.   Review of Systems  Constitutional: Negative for fever and unexpected weight change.  HENT: Negative for congestion, dental problem, ear pain, nosebleeds, postnasal drip, rhinorrhea, sinus pressure, sneezing, sore throat and trouble swallowing.   Eyes: Negative for redness and itching.  Respiratory: Negative for cough, chest tightness, shortness of breath and wheezing.   Cardiovascular: Negative for palpitations and leg swelling.  Gastrointestinal: Negative for nausea and vomiting.  Genitourinary: Negative for dysuria.  Musculoskeletal: Negative for joint swelling.  Skin: Negative for rash.  Neurological: Negative for headaches.  Hematological: Does not bruise/bleed easily.  Psychiatric/Behavioral: Negative for dysphoric mood. The patient is not nervous/anxious.        Objective:   Physical Exam Well-developed male in no acute distress Nose without purulence or discharge noted Neck without lymphadenopathy or thyromegaly No skin breakdown or pressure necrosis from the C Pap mask Lower extremities without edema, no cyanosis Alert and oriented, moves all 4 extremities.      Assessment & Plan:

## 2014-03-19 NOTE — Assessment & Plan Note (Signed)
The pt is doing better with cpap, but still having mask fit issues.  His download shows that he needs more pressure, and will extend the auto range on his device.  I have offered to send the pt to the sleep center for a mask fitting session, but he would like to work with his current mask little longer. I have stressed to him the importance again of wearing C Pap nightly in light of his severe cardiac disease. Hopefully we can make it more comfortable for him going forward.

## 2014-03-19 NOTE — Patient Instructions (Signed)
Will change your pressure range on the cpap to 5-20cm. If you continue to have issues with mask fit, let me know and I can arrange a fitting session at the sleep center. followup with me again in 63mos

## 2014-03-20 ENCOUNTER — Encounter (HOSPITAL_COMMUNITY): Payer: Self-pay | Admitting: Internal Medicine

## 2014-04-08 ENCOUNTER — Ambulatory Visit: Payer: Medicare Other | Admitting: Pulmonary Disease

## 2014-05-07 ENCOUNTER — Other Ambulatory Visit: Payer: Self-pay | Admitting: Urology

## 2014-05-19 NOTE — Patient Instructions (Addendum)
Gary Ferrell  05/19/2014   Your procedure is scheduled on: 05/26/2014    Report to [facility] Main  Entrance and follow signs to               [place] at      [place] AM.  Call this number if you have problems the morning of surgery [phone]   Remember:  Do not eat food or drink liquids :After Midnight.     Take these medicines the morning of surgery with A SIP OF WATER: Carvedilol ( Coreg), artificial tears if needed, Prilosec if needed                                You may not have any metal on your body including hair pins and              piercings  Do not wear jewelry, , lotions, powders or perfumes., deodorant.              .               Men may shave face and neck.   Do not bring valuables to the hospital. [place].  Contacts, dentures or bridgework may not be worn into surgery.       Patients discharged the day of surgery will not be allowed to drive home.  Name and phone number of your driver:  Special Instructions: coughing and deep breathing exercises, leg exercises               Please read over the following fact sheets you were given: _____________________________________________________________________             [facility] - Preparing for Surgery Before surgery, you can play an important role.  Because skin is not sterile, your skin needs to be as free of germs as possible.  You can reduce the number of germs on your skin by washing with CHG (chlorahexidine gluconate) soap before surgery.  CHG is an antiseptic cleaner which kills germs and bonds with the skin to continue killing germs even after washing. Please DO NOT use if you have an allergy to CHG or antibacterial soaps.  If your skin becomes reddened/irritated stop using the CHG and inform your nurse when you arrive at Short Stay. Do not shave (including legs and underarms) for at least 48 hours prior to the first CHG  shower.  You may shave your face/neck. Please follow these instructions carefully:  1.  Shower with CHG Soap the night before surgery and the  morning of Surgery.  2.  If you choose to wash your hair, wash your hair first as usual with your  normal  shampoo.  3.  After you shampoo, rinse your hair and body thoroughly to remove the  shampoo.                           4.  Use CHG as you would any other liquid soap.  You can apply chg directly  to the skin and wash                       Gently with  a scrungie or clean washcloth.  5.  Apply the CHG Soap to your body ONLY FROM THE NECK DOWN.   Do not use on face/ open                           Wound or open sores. Avoid contact with eyes, ears mouth and genitals (private parts).                       Wash face,  Genitals (private parts) with your normal soap.             6.  Wash thoroughly, paying special attention to the area where your surgery  will be performed.  7.  Thoroughly rinse your body with warm water from the neck down.  8.  DO NOT shower/wash with your normal soap after using and rinsing off  the CHG Soap.                9.  Pat yourself dry with a clean towel.            10.  Wear clean pajamas.            11.  Place clean sheets on your bed the night of your first shower and do not  sleep with pets. Day of Surgery : Do not apply any lotions/deodorants the morning of surgery.  Please wear clean clothes to the hospital/surgery center.  FAILURE TO FOLLOW THESE INSTRUCTIONS MAY RESULT IN THE CANCELLATION OF YOUR SURGERY PATIENT SIGNATURE_________________________________  NURSE SIGNATURE__________________________________  ________________________________________________________________________

## 2014-05-20 ENCOUNTER — Encounter: Payer: Self-pay | Admitting: Pulmonary Disease

## 2014-05-20 ENCOUNTER — Ambulatory Visit (INDEPENDENT_AMBULATORY_CARE_PROVIDER_SITE_OTHER): Payer: Medicare Other | Admitting: Internal Medicine

## 2014-05-20 ENCOUNTER — Ambulatory Visit (INDEPENDENT_AMBULATORY_CARE_PROVIDER_SITE_OTHER): Payer: Medicare Other | Admitting: Pulmonary Disease

## 2014-05-20 ENCOUNTER — Encounter: Payer: Self-pay | Admitting: Internal Medicine

## 2014-05-20 VITALS — BP 118/62 | HR 56 | Temp 97.0°F | Ht 72.0 in | Wt 167.6 lb

## 2014-05-20 VITALS — BP 120/62 | HR 60 | Ht 72.0 in | Wt 169.0 lb

## 2014-05-20 DIAGNOSIS — I255 Ischemic cardiomyopathy: Secondary | ICD-10-CM

## 2014-05-20 DIAGNOSIS — Z95810 Presence of automatic (implantable) cardiac defibrillator: Secondary | ICD-10-CM

## 2014-05-20 DIAGNOSIS — I10 Essential (primary) hypertension: Secondary | ICD-10-CM

## 2014-05-20 DIAGNOSIS — G4733 Obstructive sleep apnea (adult) (pediatric): Secondary | ICD-10-CM

## 2014-05-20 DIAGNOSIS — I5022 Chronic systolic (congestive) heart failure: Secondary | ICD-10-CM

## 2014-05-20 DIAGNOSIS — Z01810 Encounter for preprocedural cardiovascular examination: Secondary | ICD-10-CM

## 2014-05-20 LAB — MDC_IDC_ENUM_SESS_TYPE_INCLINIC
Brady Statistic RV Percent Paced: 0.11 %
HighPow Impedance: 61.875
Implantable Pulse Generator Serial Number: 7178981
Lead Channel Impedance Value: 375 Ohm
Lead Channel Pacing Threshold Amplitude: 1.125 V
Lead Channel Pacing Threshold Pulse Width: 0.5 ms
Lead Channel Sensing Intrinsic Amplitude: 12 mV
Lead Channel Sensing Intrinsic Amplitude: 5 mV
Lead Channel Setting Pacing Amplitude: 2.125
Lead Channel Setting Pacing Amplitude: 2.5 V
Lead Channel Setting Pacing Pulse Width: 0.7 ms
Lead Channel Setting Sensing Sensitivity: 0.5 mV
MDC IDC MSMT BATTERY REMAINING LONGEVITY: 102 mo
MDC IDC MSMT LEADCHNL LV IMPEDANCE VALUE: 337.5 Ohm
MDC IDC MSMT LEADCHNL LV PACING THRESHOLD AMPLITUDE: 1.25 V
MDC IDC MSMT LEADCHNL LV PACING THRESHOLD PULSEWIDTH: 0.7 ms
MDC IDC MSMT LEADCHNL RA IMPEDANCE VALUE: 487.5 Ohm
MDC IDC SESS DTM: 20160209100207
MDC IDC SET LEADCHNL RV PACING PULSEWIDTH: 0.5 ms
MDC IDC SET ZONE DETECTION INTERVAL: 270 ms
MDC IDC STAT BRADY RA PERCENT PACED: 0 %
Zone Setting Detection Interval: 315 ms

## 2014-05-20 NOTE — Assessment & Plan Note (Signed)
His St. Jude ICD is working normally. Because he did not appear to be improved with LV pacing, he is turned to VVI at 40/min.

## 2014-05-20 NOTE — Patient Instructions (Signed)
Remote monitoring is used to monitor your Pacemaker of ICD from home. This monitoring reduces the number of office visits required to check your device to one time per year. It allows [place] to keep an eye on the functioning of your device to ensure it is working properly. You are scheduled for a device check from home on 08/19/14. You may send your transmission at any time that day. If you have a wireless device, the transmission will be sent automatically. After your physician reviews your transmission, you will receive a postcard with your next transmission date.  Your physician wants you to follow-up in: November 2016 with Dr. Lovena Le.  You will receive a reminder letter in the mail two months in advance. If you don't receive a letter, please call our office to schedule the follow-up appointment.

## 2014-05-20 NOTE — Assessment & Plan Note (Signed)
He is an acceptable risk for urologic surgery. He may proceed.

## 2014-05-20 NOTE — Assessment & Plan Note (Signed)
His blood pressure is well controlled. No change in medical therapy. 

## 2014-05-20 NOTE — Assessment & Plan Note (Signed)
His symptoms are well compensated. No change in medical therapy. He is encouraged to maintain a low sodium diet.

## 2014-05-20 NOTE — Progress Notes (Signed)
   Subjective:    Patient ID: Gary Ferrell, male    DOB: 08/29/1935, 79 y.o.   MRN: [id number]  HPI The patient comes in today for follow-up of his obstructive sleep apnea. He is wearing his device compliantly by his download, and has adequate control of his AHI. He feels that he continues to benefit from the device, with adequate sleep and daytime alertness. He is having some mask leak issues at times that are aggravating, but has not kept up with his C Pap cushions.   Review of Systems  Constitutional: Negative for fever and unexpected weight change.  HENT: Negative for congestion, dental problem, ear pain, nosebleeds, postnasal drip, rhinorrhea, sinus pressure, sneezing, sore throat and trouble swallowing.   Eyes: Negative for redness and itching.  Respiratory: Negative for cough, chest tightness, shortness of breath and wheezing.   Cardiovascular: Negative for palpitations and leg swelling.  Gastrointestinal: Negative for nausea and vomiting.  Genitourinary: Negative for dysuria.  Musculoskeletal: Negative for joint swelling.  Skin: Negative for rash.  Neurological: Negative for headaches.  Hematological: Does not bruise/bleed easily.  Psychiatric/Behavioral: Negative for dysphoric mood. The patient is not nervous/anxious.        Objective:   Physical Exam Well-developed male in no acute distress Nose without purulence or discharge noted Neck without lymphadenopathy or thyromegaly No skin breakdown or pressure necrosis from the C Pap mask Lower extremities without edema, no cyanosis Alert, does not appear to be sleepy, moves all 4 extremities.       Assessment & Plan:

## 2014-05-20 NOTE — Progress Notes (Signed)
HPI [name] returns today for ongoing ICD followup after undergoing insertion of a BiV ICD several months ago. He has an ischemic CM, s/p CABG in 2007, LBBB, HTN, and dyslipidemia.His EF was 25% by echo in 2012, 35% by echo in 2014, and 35% by echo in 2015. He underwent BiV ICD insertion several months ago and when I saw him 3 months ago he felt tired. It was unclear why. We turned off his BiV pacing and he appears to be some better from a CHF perspective. No ICD shocks. He is pending prostate surgery. Allergies  Allergen Reactions  . Dapsone Other (See Comments)    REACTION: caused neuropathy/ affected walking  . Gluten Meal Dermatitis    Dermatitis      Current Outpatient Prescriptions  Medication Sig Dispense Refill  . aspirin 325 MG tablet Take 325 mg by mouth daily.    . carvedilol (COREG) 3.125 MG tablet TAKE 1 TABLET BY MOUTH 2 (TWO) TIMES DAILY WITH MEALS. 180 tablet 1  . enalapril (VASOTEC) 5 MG tablet Take 2.5 mg by mouth daily.     . methylcellulose (ARTIFICIAL TEARS) 1 % ophthalmic solution Place 1-2 drops into both eyes daily as needed (dry eyes).    . Multiple Vitamin (MULTIVITAMIN WITH MINERALS) TABS Take 1 tablet by mouth daily.    . Omega-3 Fatty Acids (FISH OIL PO) Take 1 capsule by mouth daily.    Marland Kitchen omeprazole (PRILOSEC OTC) 20 MG tablet Take 20 mg by mouth daily as needed (acid reflux).     . triamcinolone ointment (KENALOG) 0.1 % Apply 1 application topically 2 (two) times daily. Applied to legs  3  . white petrolatum (VASELINE) GEL Apply 1 application topically 2 (two) times daily. Applied to legs    . cholecalciferol (VITAMIN D) 1000 UNITS tablet Take 1,000 Units by mouth daily.     No current facility-administered medications for this visit.     Past Medical History  Diagnosis Date  . Hypertension   . Hyperlipidemia   . Coronary artery disease     a. 2007 s/p MI & CABG.  . Lung nodules   . GERD (gastroesophageal reflux disease)   . Spinal  stenosis   . CKD (chronic kidney disease), stage II   . Chronic systolic CHF (congestive heart failure)     a. 04/2013 Echo: EF 35%, inf DK, inflat AK, diast dysfxn, RV HK, mild MR, nmildly dil LA.  . Stroke     a. 2007 Periop CABG - no residual.  . Ischemic cardiomyopathy     a. 04/2013 Echo: EF 35%;  b. 06/2013 s/p SJM Bi-V ICD, ser # [id number].  Marland Kitchen PAD (peripheral artery disease)     a. 1990's s/p Aortobifem bypass.  . Carotid disease, bilateral     a. 1990's s/p L CEA;  b. 11/2004 s/p redo L CEA;  c. 06/2013 Carotid U/S: RICA 06-23%, LICA patent.  Marland Kitchen BPH (benign prostatic hyperplasia)     a. 06/2012 s/p Greenlight Laser Prostatectomy.  . Stricture of urethral meatus     a. 08/2009 s/p dil.  Marland Kitchen LBBB (left bundle branch block)   . Diverticulosis     a. 10/2013 colonoscopy (otw nl study).  . OSA (obstructive sleep apnea)     a. 12/2013 Sleep study: severe OSA - pending CPAP placement.    ROS:   All systems reviewed and negative except as noted in the HPI.   Past Surgical History  Procedure Laterality  Date  . Cardiac catheterization  2007; 2012    "unsuccessful attempt to put stents in in 2007, had MI, had to have CABG;"  . Coronary artery bypass graft  2007    "CABG X3" (06/25/2013)  . Carotid endarterectomy Left 1992; 2006  . Appendectomy  1964  . Aorta - bilateral femoral artery bypass graft  1992  . Cholecystectomy  ~ 1996  . Colon surgery  2007    herniated intestinal obstruction  . Skin graft  2008    "area of colon surgery wouldn't heal; had this graft over area"  . Ventral hernia repair  2008    ventral epigastric hernia repair [Other]  . Urethral meatoplasty  08/2009  . Green light laser turp (transurethral resection of prostate N/A 07/02/2012    Procedure: GREEN LIGHT LASER TURP (TRANSURETHRAL RESECTION OF PROSTATE;  Surgeon: Ailene Rud, MD;  Location: [facility] ORS;  Service: Urology;  Laterality: N/A;  . Bi-ventricular implantable cardioverter defibrillator  (crt-d)   06/25/2013  . Tonsillectomy    . Urethral stricture dilatation  02/2010; 09/2010  . Bi-ventricular implantable cardioverter defibrillator  (crt-d)  06/25/13    STJ CRTD implanted by Dr Lovena Le  . Bi-ventricular implantable cardioverter defibrillator N/A 06/25/2013    Procedure: BI-VENTRICULAR IMPLANTABLE CARDIOVERTER DEFIBRILLATOR  (CRT-D);  Surgeon: Evans Lance, MD;  Location: [facility] CATH LAB;  Service: Cardiovascular;  Laterality: N/A;     Family History  Problem Relation Age of Onset  . Pancreatic cancer Father   . Diabetes Brother   . Heart disease Brother   . Colon cancer Maternal Grandmother   . Parkinson's disease Mother   . Lung cancer Brother     stage 4     History   Social History  . Marital Status: Married    Spouse Name: N/A    Number of Children: 1  . Years of Education: N/A   Occupational History  . Retired    Social History Main Topics  . Smoking status: Former Smoker -- 1.00 packs/day for 20 years    Types: Cigarettes    Quit date: 04/11/1989  . Smokeless tobacco: Never Used  . Alcohol Use: No  . Drug Use: No  . Sexual Activity: No   Other Topics Concern  . Not on file   Social History Narrative     BP 120/62 mmHg  Pulse 60  Ht 6' (1.829 m)  Wt 169 lb (76.658 kg)  BMI 22.92 kg/m2  SpO2 99%  Physical Exam:  Stable appearing 79 yo man, NAD HEENT: Unremarkable Neck:  No JVD, no thyromegally Back:  No CVA tenderness Lungs:  Clear with no wheezes HEART:  IRegular rate rhythm, no murmurs, no rubs, no clicks Abd:  soft, positive bowel sounds, no organomegally, no rebound, no guarding Ext:  2 plus pulses, trace edema, no cyanosis, no clubbing Skin:  No rashes no nodules Neuro:  CN II through XII intact, motor grossly intact   ICD interrogation - normal ICD function.   Assess/Plan:

## 2014-05-20 NOTE — Assessment & Plan Note (Signed)
The pt is doing well with cpap, and his download shows good compliance and good control of his AHI.  I have asked him to keep up with mask changes on a more regular basis, and to let us know if he is having ongoing leak issues.  Will see him back in one year.

## 2014-05-20 NOTE — Patient Instructions (Signed)
Stay on cpap, and keep up with mask cushion changes every 2-3 mos. Work on modest weight reduction followup with me again in one year.  Cancel apptm this summer.

## 2014-05-20 NOTE — Assessment & Plan Note (Signed)
He is active working in his yard. He has no angina.

## 2014-05-21 ENCOUNTER — Encounter (HOSPITAL_COMMUNITY)
Admission: RE | Admit: 2014-05-21 | Discharge: 2014-05-21 | Disposition: A | Discharge status: Home / Self Care | Payer: Medicare Other | Source: Ambulatory Visit | Attending: Urology | Admitting: Urology

## 2014-05-21 ENCOUNTER — Encounter (HOSPITAL_COMMUNITY): Payer: Self-pay

## 2014-05-21 ENCOUNTER — Encounter (INDEPENDENT_AMBULATORY_CARE_PROVIDER_SITE_OTHER): Payer: Self-pay

## 2014-05-21 DIAGNOSIS — G4733 Obstructive sleep apnea (adult) (pediatric): Secondary | ICD-10-CM | POA: Insufficient documentation

## 2014-05-21 DIAGNOSIS — Z95810 Presence of automatic (implantable) cardiac defibrillator: Secondary | ICD-10-CM | POA: Insufficient documentation

## 2014-05-21 DIAGNOSIS — I5022 Chronic systolic (congestive) heart failure: Secondary | ICD-10-CM | POA: Insufficient documentation

## 2014-05-21 DIAGNOSIS — N182 Chronic kidney disease, stage 2 (mild): Secondary | ICD-10-CM | POA: Insufficient documentation

## 2014-05-21 DIAGNOSIS — E785 Hyperlipidemia, unspecified: Secondary | ICD-10-CM | POA: Insufficient documentation

## 2014-05-21 DIAGNOSIS — Z8673 Personal history of transient ischemic attack (TIA), and cerebral infarction without residual deficits: Secondary | ICD-10-CM | POA: Insufficient documentation

## 2014-05-21 DIAGNOSIS — N400 Enlarged prostate without lower urinary tract symptoms: Secondary | ICD-10-CM | POA: Diagnosis not present

## 2014-05-21 DIAGNOSIS — Z01812 Encounter for preprocedural laboratory examination: Secondary | ICD-10-CM | POA: Insufficient documentation

## 2014-05-21 DIAGNOSIS — I129 Hypertensive chronic kidney disease with stage 1 through stage 4 chronic kidney disease, or unspecified chronic kidney disease: Secondary | ICD-10-CM | POA: Diagnosis not present

## 2014-05-21 DIAGNOSIS — I2510 Atherosclerotic heart disease of native coronary artery without angina pectoris: Secondary | ICD-10-CM | POA: Diagnosis not present

## 2014-05-21 HISTORY — DX: Presence of automatic (implantable) cardiac defibrillator: Z95.810

## 2014-05-21 LAB — BASIC METABOLIC PANEL
Anion gap: 6 (ref 5–15)
BUN: 16 mg/dL (ref 6–23)
CALCIUM: 9.3 mg/dL (ref 8.4–10.5)
CHLORIDE: 104 mmol/L (ref 96–112)
CO2: 33 mmol/L — ABNORMAL HIGH (ref 19–32)
Creatinine, Ser: 0.8 mg/dL (ref 0.50–1.35)
GFR calc Af Amer: >90 mL/min (ref >90)
GFR calc non Af Amer: 85 mL/min — ABNORMAL LOW (ref >90)
Glucose, Bld: 75 mg/dL (ref 70–99)
POTASSIUM: 4.5 mmol/L (ref 3.5–5.1)
SODIUM: 143 mmol/L (ref 135–145)

## 2014-05-21 LAB — CBC
HCT: 45 % (ref 39.0–52.0)
Hemoglobin: 14.9 g/dL (ref 13.0–17.0)
MCH: 30.2 pg (ref 26.0–34.0)
MCHC: 33.1 g/dL (ref 30.0–36.0)
MCV: 91.1 fL (ref 78.0–100.0)
Platelets: 137 10*3/uL — ABNORMAL LOW (ref 150–400)
RBC: 4.94 MIL/uL (ref 4.22–5.81)
RDW: 13.3 % (ref 11.5–15.5)
WBC: 5 10*3/uL (ref 4.0–10.5)

## 2014-05-21 NOTE — Progress Notes (Addendum)
EKG- 02/04/14 EPIC  Last Device Check- 03/05/14 EPIC  12/29/13 Sleep Study Documents in EPIC 04/30/13 ECHO- EPIC  Last CXR- 06/26/13 EPIC  LOV with Pulm- Clance- 05/20/14 EPIC  LOV- 05/20/2014 with Dr Lovena Le- EPIC Clearance note for surgery.

## 2014-05-25 ENCOUNTER — Encounter (HOSPITAL_COMMUNITY): Payer: Self-pay | Admitting: Anesthesiology

## 2014-05-25 NOTE — Anesthesia Preprocedure Evaluation (Deleted)
Anesthesia Evaluation  Patient identified by MRN, date of birth, ID band Patient awake  General Assessment Comment:Hypertension   . Hyperlipidemia  . Coronary artery disease    a. 2007 s/p MI & CABG. . Lung nodules  . GERD (gastroesophageal reflux disease)  . Spinal stenosis  . CKD (chronic kidney disease), stage II  . Chronic systolic CHF (congestive heart failure)    a. 04/2013 Echo: EF 35%, inf DK, inflat AK, diast dysfxn, RV HK, mild MR, nmildly dil LA. . Stroke    a. 2007 Periop CABG - no residual. . Ischemic cardiomyopathy    a. 04/2013 Echo: EF 35%; b. 06/2013 s/p SJM Bi-V ICD, ser # [id number]. Marland Kitchen PAD (peripheral artery disease)    a. 1990's s/p Aortobifem bypass. . Carotid disease, bilateral    a. 1990's s/p L CEA; b. 11/2004 s/p redo L CEA; c. 06/2013 Carotid U/S: RICA 59-74%, LICA patent. Marland Kitchen BPH (benign prostatic hyperplasia)    a. 06/2012 s/p Greenlight Laser Prostatectomy. . Stricture of urethral meatus    a. 08/2009 s/p dil. Marland Kitchen LBBB (left bundle branch block)  . Diverticulosis    a. 10/2013 colonoscopy (otw nl study). . OSA (obstructive sleep apnea)    a. 12/2013 Sleep study: severe OSA - pending CPAP placement.        Reviewed: Allergy & Precautions, NPO status , Patient's Chart, lab work & pertinent test results  History of Anesthesia Complications Negative for: history of anesthetic complications  Airway Mallampati: II  TM Distance: >3 FB Neck ROM: Full    Dental no notable dental hx. (+) Dental Advisory Given   Pulmonary sleep apnea , former smoker,  breath sounds clear to auscultation  Pulmonary exam normal       Cardiovascular hypertension, Pt. on medications and Pt. on home beta blockers + CAD, + Past MI, + Peripheral Vascular Disease and +CHF + dysrhythmias + Cardiac Defibrillator Rhythm:Regular Rate:Normal      Neuro/Psych CVA negative psych ROS   GI/Hepatic Neg liver ROS, GERD-  ,  Endo/Other  negative endocrine ROS  Renal/GU Renal disease  negative genitourinary   Musculoskeletal negative musculoskeletal ROS (+)   Abdominal   Peds negative pediatric ROS (+)  Hematology negative hematology ROS (+)   Anesthesia Other Findings   Reproductive/Obstetrics negative OB ROS                            Anesthesia Physical Anesthesia Plan  ASA: IV  Anesthesia Plan: General   Post-op Pain Management:    Induction: Intravenous  Airway Management Planned: LMA  Additional Equipment:   Intra-op Plan:   Post-operative Plan: Extubation in OR  Informed Consent: I have reviewed the patients History and Physical, chart, labs and discussed the procedure including the risks, benefits and alternatives for the proposed anesthesia with the patient or authorized representative who has indicated his/her understanding and acceptance.   Dental advisory given  Plan Discussed with: CRNA  Anesthesia Plan Comments:         Anesthesia Quick Evaluation

## 2014-05-26 ENCOUNTER — Encounter (HOSPITAL_COMMUNITY): Admission: RE | Payer: Self-pay | Source: Ambulatory Visit

## 2014-05-26 ENCOUNTER — Ambulatory Visit (HOSPITAL_COMMUNITY): Admission: RE | Admit: 2014-05-26 | Payer: Medicare Other | Source: Ambulatory Visit | Admitting: Urology

## 2014-05-26 SURGERY — GREEN LIGHT LASER TURP (TRANSURETHRAL RESECTION OF PROSTATE
Anesthesia: General

## 2014-05-27 ENCOUNTER — Other Ambulatory Visit: Payer: Self-pay | Admitting: Urology

## 2014-06-17 ENCOUNTER — Inpatient Hospital Stay (HOSPITAL_COMMUNITY): Admission: RE | Admit: 2014-06-17 | Payer: Medicare Other | Source: Ambulatory Visit

## 2014-06-23 ENCOUNTER — Encounter (HOSPITAL_COMMUNITY): Admission: RE | Payer: Self-pay | Source: Ambulatory Visit

## 2014-06-23 ENCOUNTER — Ambulatory Visit (HOSPITAL_COMMUNITY): Admission: RE | Admit: 2014-06-23 | Payer: Medicare Other | Source: Ambulatory Visit | Admitting: Urology

## 2014-06-23 SURGERY — GREEN LIGHT LASER TURP (TRANSURETHRAL RESECTION OF PROSTATE
Anesthesia: General

## 2014-07-08 ENCOUNTER — Other Ambulatory Visit (HOSPITAL_COMMUNITY): Payer: Medicare Other

## 2014-07-08 ENCOUNTER — Ambulatory Visit: Payer: Medicare Other | Admitting: Family

## 2014-08-19 ENCOUNTER — Telehealth: Payer: Self-pay | Admitting: Cardiology

## 2014-08-19 ENCOUNTER — Encounter: Payer: Medicare Other | Admitting: *Deleted

## 2014-08-19 NOTE — Telephone Encounter (Signed)
Spoke with pt and reminded pt of remote transmission that is due today. Pt verbalized understanding.   

## 2014-08-22 ENCOUNTER — Encounter: Payer: Self-pay | Admitting: Cardiology

## 2014-08-29 ENCOUNTER — Other Ambulatory Visit: Payer: Self-pay | Admitting: Internal Medicine

## 2014-09-18 ENCOUNTER — Ambulatory Visit: Payer: Medicare Other | Admitting: Pulmonary Disease

## 2014-12-08 ENCOUNTER — Other Ambulatory Visit: Payer: Self-pay | Admitting: *Deleted

## 2014-12-08 DIAGNOSIS — I6523 Occlusion and stenosis of bilateral carotid arteries: Secondary | ICD-10-CM

## 2014-12-08 DIAGNOSIS — Z48812 Encounter for surgical aftercare following surgery on the circulatory system: Secondary | ICD-10-CM

## 2014-12-24 ENCOUNTER — Encounter: Payer: Self-pay | Admitting: Family

## 2014-12-26 ENCOUNTER — Ambulatory Visit (HOSPITAL_COMMUNITY)
Admission: RE | Admit: 2014-12-26 | Discharge: 2014-12-26 | Disposition: A | Discharge status: Home / Self Care | Payer: Medicare Other | Source: Ambulatory Visit | Attending: Family | Admitting: Family

## 2014-12-26 ENCOUNTER — Encounter: Payer: Self-pay | Admitting: Family

## 2014-12-26 ENCOUNTER — Ambulatory Visit (INDEPENDENT_AMBULATORY_CARE_PROVIDER_SITE_OTHER): Payer: Medicare Other | Admitting: Family

## 2014-12-26 VITALS — BP 111/57 | HR 53 | Temp 97.6°F | Resp 16 | Ht 72.0 in | Wt 169.0 lb

## 2014-12-26 DIAGNOSIS — I6523 Occlusion and stenosis of bilateral carotid arteries: Secondary | ICD-10-CM

## 2014-12-26 DIAGNOSIS — Z9889 Other specified postprocedural states: Secondary | ICD-10-CM

## 2014-12-26 DIAGNOSIS — N182 Chronic kidney disease, stage 2 (mild): Secondary | ICD-10-CM | POA: Diagnosis not present

## 2014-12-26 DIAGNOSIS — G4733 Obstructive sleep apnea (adult) (pediatric): Secondary | ICD-10-CM | POA: Diagnosis not present

## 2014-12-26 DIAGNOSIS — E785 Hyperlipidemia, unspecified: Secondary | ICD-10-CM | POA: Insufficient documentation

## 2014-12-26 DIAGNOSIS — I129 Hypertensive chronic kidney disease with stage 1 through stage 4 chronic kidney disease, or unspecified chronic kidney disease: Secondary | ICD-10-CM | POA: Diagnosis not present

## 2014-12-26 DIAGNOSIS — I255 Ischemic cardiomyopathy: Secondary | ICD-10-CM

## 2014-12-26 DIAGNOSIS — Z48812 Encounter for surgical aftercare following surgery on the circulatory system: Secondary | ICD-10-CM | POA: Diagnosis not present

## 2014-12-26 NOTE — Patient Instructions (Signed)
Stroke Prevention Some medical conditions and behaviors are associated with an increased chance of having a stroke. You may prevent a stroke by making healthy choices and managing medical conditions. HOW CAN I REDUCE MY RISK OF HAVING A STROKE?   Stay physically active. Get at least 30 minutes of activity on most or all days.  Do not smoke. It may also be helpful to avoid exposure to secondhand smoke.  Limit alcohol use. Moderate alcohol use is considered to be:  No more than 2 drinks per day for men.  No more than 1 drink per day for nonpregnant women.  Eat healthy foods. This involves:  Eating 5 or more servings of fruits and vegetables a day.  Making dietary changes that address high blood pressure (hypertension), high cholesterol, diabetes, or obesity.  Manage your cholesterol levels.  Making food choices that are high in fiber and low in saturated fat, trans fat, and cholesterol may control cholesterol levels.  Take any prescribed medicines to control cholesterol as directed by your health care provider.  Manage your diabetes.  Controlling your carbohydrate and sugar intake is recommended to manage diabetes.  Take any prescribed medicines to control diabetes as directed by your health care provider.  Control your hypertension.  Making food choices that are low in salt (sodium), saturated fat, trans fat, and cholesterol is recommended to manage hypertension.  Take any prescribed medicines to control hypertension as directed by your health care provider.  Maintain a healthy weight.  Reducing calorie intake and making food choices that are low in sodium, saturated fat, trans fat, and cholesterol are recommended to manage weight.  Stop drug abuse.  Avoid taking birth control pills.  Talk to your health care provider about the risks of taking birth control pills if you are over 35 years old, smoke, get migraines, or have ever had a blood clot.  Get evaluated for sleep  disorders (sleep apnea).  Talk to your health care provider about getting a sleep evaluation if you snore a lot or have excessive sleepiness.  Take medicines only as directed by your health care provider.  For some people, aspirin or blood thinners (anticoagulants) are helpful in reducing the risk of forming abnormal blood clots that can lead to stroke. If you have the irregular heart rhythm of atrial fibrillation, you should be on a blood thinner unless there is a good reason you cannot take them.  Understand all your medicine instructions.  Make sure that other conditions (such as anemia or atherosclerosis) are addressed. SEEK IMMEDIATE MEDICAL CARE IF:   You have sudden weakness or numbness of the face, arm, or leg, especially on one side of the body.  Your face or eyelid droops to one side.  You have sudden confusion.  You have trouble speaking (aphasia) or understanding.  You have sudden trouble seeing in one or both eyes.  You have sudden trouble walking.  You have dizziness.  You have a loss of balance or coordination.  You have a sudden, severe headache with no known cause.  You have new chest pain or an irregular heartbeat. Any of these symptoms may represent a serious problem that is an emergency. Do not wait to see if the symptoms will go away. Get medical help at once. Call your local emergency services (911 in [place] Do not drive yourself to the hospital. Document Released: 05/05/2004 Document Revised: 08/12/2013 Document Reviewed: 09/28/2012 Exit[employer] Patient Information 2015 [employer]. This information is not intended to replace advice given   to you by your health care provider. Make sure you discuss any questions you have with your health care provider.  

## 2014-12-26 NOTE — Progress Notes (Signed)
Established Carotid Patient   History of Present Illness  Gary Ferrell is a 79 y.o. male patient of Dr. Kellie Simmering who is s/p left carotid endarterectomy performed in 1992 and a redo left carotid endarterectomy in 2006 by Dr. Kellie Simmering. He remains asymptomatic. He denies any lateralizing weakness, aphasia, amaurosis fugax, diplopia, blurred vision, or syncope.  He also underwent aortobifemoral bypass grafting by Dr. Kellie Simmering in 1992 and denies any lower extremity claudication symptoms currently. He has a long history of some nerve damage in the right leg which does not limit him significantly.  He had a defibrillator inserted by Dr. Crissie Sickles is doing well from that standpoint.  He had a stroke in about 1984 as a result of too high of a dose of Dapsone given to him by his pharmacy, per pt. His symptoms manifested as not being able to walk very well.  He had a TIA after his MI in 2007 as manifested by speech difficulties and lack of coordination.  Pt denies any subsequent TIA's or strokes since 2007.  He has an allergy to gluten.   The patient denies New Medical or Surgical History.  Pt Diabetic: no Pt smoker: former smoker, quit in 1992  Pt meds include: Statin :yes, rosuvastatin ASA: yes Other anticoagulants/antiplatelets: no   Past Medical History  Diagnosis Date  . Hypertension   . Hyperlipidemia   . Coronary artery disease     a. 2007 s/p MI & CABG.  . Lung nodules   . GERD (gastroesophageal reflux disease)   . Spinal stenosis   . CKD (chronic kidney disease), stage II   . Chronic systolic CHF (congestive heart failure)     a. 04/2013 Echo: EF 35%, inf DK, inflat AK, diast dysfxn, RV HK, mild MR, nmildly dil LA.  . Stroke     a. 2007 Periop CABG - no residual.  . Ischemic cardiomyopathy     a. 04/2013 Echo: EF 35%;  b. 06/2013 s/p SJM Bi-V ICD, ser # [id number].  Marland Kitchen PAD (peripheral artery disease)     a. 1990's s/p Aortobifem bypass.  . Carotid disease, bilateral     a. 1990's s/p  L CEA;  b. 11/2004 s/p redo L CEA;  c. 06/2013 Carotid U/S: RICA 99-37%, LICA patent.  Marland Kitchen BPH (benign prostatic hyperplasia)     a. 06/2012 s/p Greenlight Laser Prostatectomy.  . Stricture of urethral meatus     a. 08/2009 s/p dil.  Marland Kitchen LBBB (left bundle branch block)   . Diverticulosis     a. 10/2013 colonoscopy (otw nl study).  . Myocardial infarction   . AICD (automatic cardioverter/defibrillator) present   . OSA (obstructive sleep apnea)     a. 12/2013 Sleep study: severe OSA - CPAP-     Social History Social History  Substance Use Topics  . Smoking status: Former Smoker -- 1.00 packs/day for 20 years    Types: Cigarettes    Quit date: 04/11/1989  . Smokeless tobacco: Never Used  . Alcohol Use: No    Family History Family History  Problem Relation Age of Onset  . Pancreatic cancer Father   . Diabetes Brother   . Heart disease Brother   . Colon cancer Maternal Grandmother   . Parkinson's disease Mother   . Lung cancer Brother     stage 4    Surgical History Past Surgical History  Procedure Laterality Date  . Cardiac catheterization  2007; 2012    "unsuccessful attempt to put stents in in  2007, had MI, had to have CABG;"  . Coronary artery bypass graft  2007    "CABG X3" (06/25/2013)  . Carotid endarterectomy Left 1992; 2006  . Appendectomy  1964  . Aorta - bilateral femoral artery bypass graft  1992  . Cholecystectomy  ~ 1996  . Colon surgery  2007    herniated intestinal obstruction  . Skin graft  2008    "area of colon surgery wouldn't heal; had this graft over area"  . Ventral hernia repair  2008    ventral epigastric hernia repair [Other]  . Urethral meatoplasty  08/2009  . Green light laser turp (transurethral resection of prostate N/A 07/02/2012    Procedure: GREEN LIGHT LASER TURP (TRANSURETHRAL RESECTION OF PROSTATE;  Surgeon: Ailene Rud, MD;  Location: [facility];  Service: Urology;  Laterality: N/A;  . Bi-ventricular implantable cardioverter  defibrillator  (crt-d)  06/25/2013  . Tonsillectomy    . Urethral stricture dilatation  02/2010; 09/2010  . Bi-ventricular implantable cardioverter defibrillator  (crt-d)  06/25/13    STJ CRTD implanted by Dr Lovena Le  . Bi-ventricular implantable cardioverter defibrillator N/A 06/25/2013    Procedure: BI-VENTRICULAR IMPLANTABLE CARDIOVERTER DEFIBRILLATOR  (CRT-D);  Surgeon: Evans Lance, MD;  Location: [facility] CATH LAB;  Service: Cardiovascular;  Laterality: N/A;    Allergies  Allergen Reactions  . Dapsone Other (See Comments)    REACTION: caused neuropathy/ affected walking  patient stated he received high dose of dapsone which caused the neuropathy   . Gluten Meal Dermatitis    Dermatitis     Current Outpatient Prescriptions  Medication Sig Dispense Refill  . aspirin 325 MG tablet Take 325 mg by mouth daily.    . carvedilol (COREG) 3.125 MG tablet TAKE 1 TABLET BY MOUTH 2 (TWO) TIMES DAILY WITH MEALS. 180 tablet 1  . cholecalciferol (VITAMIN D) 1000 UNITS tablet Take 1,000 Units by mouth daily.    . enalapril (VASOTEC) 5 MG tablet Take 2.5 mg by mouth every morning.     . Multiple Vitamin (MULTIVITAMIN WITH MINERALS) TABS Take 1 tablet by mouth daily.    . Omega-3 Fatty Acids (FISH OIL PO) Take 1 capsule by mouth daily.    . white petrolatum (VASELINE) GEL Apply 1 application topically 2 (two) times daily. Applied to legs    . methylcellulose (ARTIFICIAL TEARS) 1 % ophthalmic solution Place 1-2 drops into both eyes daily as needed (dry eyes).    Marland Kitchen omeprazole (PRILOSEC OTC) 20 MG tablet Take 20 mg by mouth daily as needed (acid reflux).      No current facility-administered medications for this visit.    Review of Systems : See HPI for pertinent positives and negatives.  Physical Examination  Filed Vitals:   12/26/14 1148 12/26/14 1150  BP: 116/43 111/57  Pulse: 53 53  Temp: 97.6 F (36.4 C)   Resp: 16   Height: 6' (1.829 m)   Weight: 169 lb (76.658 kg)   SpO2: 100%    Body  mass index is 22.92 kg/(m^2).  General: WDWN male in NAD GAIT: normal Eyes: PERRLA Pulmonary:  Non-labored, CTAB, no rales, no rhonchi, & no wheezing.  Cardiac: regular rhythm, no detected murmur.  VASCULAR EXAM Carotid Bruits Right Left   Negative Negative    Aorta is not palpable. Radial pulses are 2+ palpable and equal.  LE Pulses Right Left       POPLITEAL  not palpable   not palpable       POSTERIOR TIBIAL  not palpable   not palpable        DORSALIS PEDIS      ANTERIOR TIBIAL not palpable  1+ palpable     Gastrointestinal: soft, nontender, BS WNL, no r/g,  no palpable masses.  Musculoskeletal: No muscle atrophy/wasting. M/S 5/5 throughout, extremities without ischemic changes.  Neurologic: A&O X 3; Appropriate Affect, Speech is normal CN 2-12 intact, pain and light touch intact in extremities, Motor exam as listed above.   Non-Invasive Vascular Imaging CAROTID DUPLEX 12/26/2014   Right ICA: 40 - 59 % stenosis. Left ICA: <40% stenosis, endarterectomy site.  No significant change from previous exam on 07/02/13.   Assessment: CLOUD GRAHAM is a 79 y.o. male who is s/p left carotid endarterectomy performed in 1992 and a redo left carotid endarterectomy in 2006 by Dr. Kellie Simmering.  He had a TIA in 2007, non subsequently.   He also underwent aortobifemoral bypass grafting by Dr. Kellie Simmering in 1992 and denies any lower extremity claudication symptoms currently. He has a long history of some nerve damage in the right leg which does not limit him significantly. Today's carotid duplex suggests mild/moderate right ICA stenosis and minimal left ICA stenosis. No significant change from previous exam on 07/02/13.     Plan: Follow-up in 1 year with Carotid Duplex.   I discussed in depth with the patient the nature of atherosclerosis, and emphasized the  importance of maximal medical management including strict control of blood pressure, blood glucose, and lipid levels, obtaining regular exercise, and continued cessation of smoking.  The patient is aware that without maximal medical management the underlying atherosclerotic disease process will progress, limiting the benefit of any interventions. The patient was given information about stroke prevention and what symptoms should prompt the patient to seek immediate medical care. Thank you for allowing [place] to participate in this patient's care.  Clemon Chambers, RN, MSN, FNP-C [facility] Office: [phone]  Clinic Physician: Bridgett Larsson  12/26/2014 12:04 PM

## 2015-01-23 ENCOUNTER — Telehealth: Payer: Self-pay | Admitting: *Deleted

## 2015-01-23 NOTE — Telephone Encounter (Signed)
Scott from [employer] [employer] called to have patient released from [employer] he is transferring care. Patient released in [place]. Noted in [place].

## 2015-03-09 ENCOUNTER — Telehealth: Payer: Self-pay | Admitting: Internal Medicine

## 2015-03-09 NOTE — Telephone Encounter (Signed)
Noted in [facility] / pt had previously been released in [employer].

## 2015-03-09 NOTE — Telephone Encounter (Signed)
New Message    Pt called stating that he has moved and is now being followed by a provider in [place] for all of his cardiology needs. Please update in our system.

## 2015-03-18 ENCOUNTER — Encounter: Payer: Medicare Other | Admitting: Internal Medicine

## 2015-05-21 ENCOUNTER — Ambulatory Visit: Payer: Medicare Other | Admitting: Pulmonary Disease

## 2015-06-09 ENCOUNTER — Inpatient Hospital Stay: Payer: Medicare Other

## 2015-06-09 ENCOUNTER — Inpatient Hospital Stay: Payer: Medicare Other | Attending: Internal Medicine | Admitting: Internal Medicine

## 2015-06-09 ENCOUNTER — Encounter: Payer: Self-pay | Admitting: Internal Medicine

## 2015-06-09 VITALS — BP 129/45 | HR 60 | Temp 98.2°F | Resp 18 | Ht 72.0 in | Wt 166.7 lb

## 2015-06-09 DIAGNOSIS — Z801 Family history of malignant neoplasm of trachea, bronchus and lung: Secondary | ICD-10-CM | POA: Insufficient documentation

## 2015-06-09 DIAGNOSIS — E785 Hyperlipidemia, unspecified: Secondary | ICD-10-CM | POA: Diagnosis not present

## 2015-06-09 DIAGNOSIS — Z87891 Personal history of nicotine dependence: Secondary | ICD-10-CM | POA: Diagnosis not present

## 2015-06-09 DIAGNOSIS — Z79899 Other long term (current) drug therapy: Secondary | ICD-10-CM | POA: Diagnosis not present

## 2015-06-09 DIAGNOSIS — D649 Anemia, unspecified: Secondary | ICD-10-CM | POA: Diagnosis not present

## 2015-06-09 DIAGNOSIS — I129 Hypertensive chronic kidney disease with stage 1 through stage 4 chronic kidney disease, or unspecified chronic kidney disease: Secondary | ICD-10-CM | POA: Diagnosis not present

## 2015-06-09 DIAGNOSIS — D696 Thrombocytopenia, unspecified: Secondary | ICD-10-CM | POA: Diagnosis not present

## 2015-06-09 DIAGNOSIS — R161 Splenomegaly, not elsewhere classified: Secondary | ICD-10-CM | POA: Diagnosis not present

## 2015-06-09 DIAGNOSIS — I2510 Atherosclerotic heart disease of native coronary artery without angina pectoris: Secondary | ICD-10-CM | POA: Diagnosis not present

## 2015-06-09 DIAGNOSIS — I739 Peripheral vascular disease, unspecified: Secondary | ICD-10-CM

## 2015-06-09 DIAGNOSIS — I447 Left bundle-branch block, unspecified: Secondary | ICD-10-CM | POA: Insufficient documentation

## 2015-06-09 DIAGNOSIS — N182 Chronic kidney disease, stage 2 (mild): Secondary | ICD-10-CM | POA: Insufficient documentation

## 2015-06-09 DIAGNOSIS — G4733 Obstructive sleep apnea (adult) (pediatric): Secondary | ICD-10-CM | POA: Insufficient documentation

## 2015-06-09 DIAGNOSIS — I5022 Chronic systolic (congestive) heart failure: Secondary | ICD-10-CM | POA: Insufficient documentation

## 2015-06-09 DIAGNOSIS — I252 Old myocardial infarction: Secondary | ICD-10-CM | POA: Diagnosis not present

## 2015-06-09 DIAGNOSIS — Z800 Family history of malignant neoplasm of digestive organs: Secondary | ICD-10-CM | POA: Diagnosis not present

## 2015-06-09 DIAGNOSIS — Z7982 Long term (current) use of aspirin: Secondary | ICD-10-CM | POA: Insufficient documentation

## 2015-06-09 LAB — CBC WITH DIFFERENTIAL/PLATELET
Basophils Absolute: 0 K/uL (ref 0–0.1)
Basophils Relative: 1 %
Eosinophils Absolute: 0 K/uL (ref 0–0.7)
Eosinophils Relative: 0 %
HCT: 39.9 % — ABNORMAL LOW (ref 40.0–52.0)
Hemoglobin: 13.3 g/dL (ref 13.0–18.0)
Lymphocytes Relative: 18 %
Lymphs Abs: 0.6 K/uL — ABNORMAL LOW (ref 1.0–3.6)
MCH: 29.3 pg (ref 26.0–34.0)
MCHC: 33.4 g/dL (ref 32.0–36.0)
MCV: 87.8 fL (ref 80.0–100.0)
Monocytes Absolute: 0.5 K/uL (ref 0.2–1.0)
Monocytes Relative: 14 %
Neutro Abs: 2.5 K/uL (ref 1.4–6.5)
Neutrophils Relative %: 67 %
Platelets: 104 K/uL — ABNORMAL LOW (ref 150–440)
RBC: 4.54 MIL/uL (ref 4.40–5.90)
RDW: 14 % (ref 11.5–14.5)
WBC: 3.6 K/uL — ABNORMAL LOW (ref 3.8–10.6)

## 2015-06-09 LAB — VITAMIN B12: Vitamin B-12: 691 pg/mL (ref 180–914)

## 2015-06-09 LAB — RETICULOCYTES
RBC.: 4.46 MIL/uL (ref 4.40–5.90)
RETIC COUNT ABSOLUTE: 62.4 10*3/uL (ref 19.0–183.0)
Retic Ct Pct: 1.4 % (ref 0.4–3.1)

## 2015-06-09 LAB — IRON AND TIBC
Iron: 77 ug/dL (ref 45–182)
Saturation Ratios: 19 % (ref 17.9–39.5)
TIBC: 413 ug/dL (ref 250–450)
UIBC: 336 ug/dL

## 2015-06-09 LAB — FOLATE: Folate: 36 ng/mL

## 2015-06-09 LAB — LACTATE DEHYDROGENASE: LDH: 169 U/L (ref 98–192)

## 2015-06-09 LAB — FERRITIN: Ferritin: 103 ng/mL (ref 24–336)

## 2015-06-09 NOTE — Progress Notes (Signed)
Gary Ferrell NOTE  Patient Care Team: Shela Nevin, MD as Attending Physician (Cardiology)  CHIEF COMPLAINTS/PURPOSE OF CONSULTATION:   # 2014- THROMBOCYTOPENIA [130s-140s]; Feb- 2017- 113; Mild Anemia ~12; wbc-N.    HISTORY OF PRESENTING ILLNESS:  Gary Ferrell 80 y.o.  male Caucasian patient has been referred was for further evaluation of thrombocytopenia.  On review of labs patient- had mild thrombocytopenia 130s to 140s since 2014.  However more recently in February 2016 patient's platelet count dropped to 113; and also mild anemia hemoglobin 12.8 normal MCV. White count is normal.   patient denies any unusual weight loss;  Denies any unusual  Night sweats or  Low-grade fevers. Appetite in general is fair.  Denies any early satiety. Denies any gum bleeding or nose bleeds.  Denies any chest pain or shortness of breath or cough.  Patient does feel fatigued- this is chronic.  Denies any blood in stools or black stools.  Patient denies drinking alcohol/ or  Any new medications.  Patient  Having recent transfusions or  Having previous bone marrow biopsy.   ROS: A complete 10 point review of system is done which is negative except mentioned above in history of present illness  MEDICAL HISTORY:  Past Medical History  Diagnosis Date  . Hypertension   . Hyperlipidemia   . Coronary artery disease     a. 2007 s/p MI & CABG.  . Lung nodules   . GERD (gastroesophageal reflux disease)   . Spinal stenosis   . CKD (chronic kidney disease), stage II   . Chronic systolic CHF (congestive heart failure) ([place])     a. 04/2013 Echo: EF 35%, inf DK, inflat AK, diast dysfxn, RV HK, mild MR, nmildly dil LA.  . Stroke [facility])     a. 2007 Periop CABG - no residual.  . Ischemic cardiomyopathy     a. 04/2013 Echo: EF 35%;  b. 06/2013 s/p SJM Bi-V ICD, ser # [id number].  Marland Kitchen PAD (peripheral artery disease) ([place])     a. 1990's s/p Aortobifem bypass.  . Carotid disease, bilateral ([place])      a. 1990's s/p L CEA;  b. 11/2004 s/p redo L CEA;  c. 06/2013 Carotid U/S: RICA 93-79%, LICA patent.  Marland Kitchen BPH (benign prostatic hyperplasia)     a. 06/2012 s/p Greenlight Laser Prostatectomy.  . Stricture of urethral meatus     a. 08/2009 s/p dil.  Marland Kitchen LBBB (left bundle branch block)   . Diverticulosis     a. 10/2013 colonoscopy (otw nl study).  . Myocardial infarction ([place])   . AICD (automatic cardioverter/defibrillator) present   . OSA (obstructive sleep apnea)     a. 12/2013 Sleep study: severe OSA - CPAP-     SURGICAL HISTORY: Past Surgical History  Procedure Laterality Date  . Cardiac catheterization  2007; 2012    "unsuccessful attempt to put stents in in 2007, had MI, had to have CABG;"  . Coronary artery bypass graft  2007    "CABG X3" (06/25/2013)  . Carotid endarterectomy Left 1992; 2006  . Appendectomy  1964  . Aorta - bilateral femoral artery bypass graft  1992  . Cholecystectomy  ~ 1996  . Colon surgery  2007    herniated intestinal obstruction  . Skin graft  2008    "area of colon surgery wouldn't heal; had this graft over area"  . Ventral hernia repair  2008    ventral epigastric hernia repair [Other]  . Urethral meatoplasty  08/2009  . Green light laser turp (transurethral resection of prostate N/A 07/02/2012    Procedure: GREEN LIGHT LASER TURP (TRANSURETHRAL RESECTION OF PROSTATE;  Surgeon: Ailene Rud, MD;  Location: [facility];  Service: Urology;  Laterality: N/A;  . Bi-ventricular implantable cardioverter defibrillator  (crt-d)  06/25/2013  . Tonsillectomy    . Urethral stricture dilatation  02/2010; 09/2010  . Bi-ventricular implantable cardioverter defibrillator  (crt-d)  06/25/13    STJ CRTD implanted by Dr Lovena Le  . Bi-ventricular implantable cardioverter defibrillator N/A 06/25/2013    Procedure: BI-VENTRICULAR IMPLANTABLE CARDIOVERTER DEFIBRILLATOR  (CRT-D);  Surgeon: Evans Lance, MD;  Location: [facility] CATH LAB;  Service: Cardiovascular;  Laterality: N/A;     SOCIAL HISTORY:  Patient lives in  [place];   Quit smoking 1992;  No alcohol. He lives alone.  Retired Medical illustrator. Social History   Social History  . Marital Status: Married    Spouse Name: N/A  . Number of Children: 1  . Years of Education: N/A   Occupational History  . Retired    Social History Main Topics  . Smoking status: Former Smoker -- 1.00 packs/day for 20 years    Types: Cigarettes    Quit date: 04/11/1989  . Smokeless tobacco: Never Used  . Alcohol Use: No  . Drug Use: No  . Sexual Activity: No   Other Topics Concern  . Not on file   Social History Narrative    FAMILY HISTORY: Family History  Problem Relation Age of Onset  . Pancreatic cancer Father   . Diabetes Brother   . Heart disease Brother   . Colon cancer Maternal Grandmother   . Parkinson's disease Mother   . Lung cancer Brother     stage 4    ALLERGIES:  is allergic to dapsone and gluten meal.  MEDICATIONS:  Current Outpatient Prescriptions  Medication Sig Dispense Refill  . aspirin 325 MG tablet Take 325 mg by mouth daily.    . carvedilol (COREG) 3.125 MG tablet TAKE 1 TABLET BY MOUTH 2 (TWO) TIMES DAILY WITH MEALS. 180 tablet 1  . cholecalciferol (VITAMIN D) 1000 UNITS tablet Take 1,000 Units by mouth daily.    . enalapril (VASOTEC) 5 MG tablet Take 2.5 mg by mouth every morning.     . methylcellulose (ARTIFICIAL TEARS) 1 % ophthalmic solution Place 1-2 drops into both eyes daily as needed (dry eyes).    . Multiple Vitamin (MULTIVITAMIN WITH MINERALS) TABS Take 1 tablet by mouth daily.    . Omega-3 Fatty Acids (FISH OIL PO) Take 1 capsule by mouth daily.    . rosuvastatin (CRESTOR) 20 MG tablet Take 20 mg by mouth daily.    . white petrolatum (VASELINE) GEL Apply 1 application topically 2 (two) times daily. Applied to legs    . omeprazole (PRILOSEC OTC) 20 MG tablet Take 20 mg by mouth daily as needed (acid reflux). Reported on 06/09/2015     No current facility-administered  medications for this visit.      Marland Kitchen  PHYSICAL EXAMINATION: ECOG PERFORMANCE STATUS: 0 - Asymptomatic  Filed Vitals:   06/09/15 1101  BP: 129/45  Pulse: 60  Temp: 98.2 F (36.8 C)  Resp: 18   Filed Weights   06/09/15 1101  Weight: 166 lb 10.7 oz (75.6 kg)    GENERAL: Well-nourished well-developed; Alert, no distress and comfortable.  Alone EYES: no pallor or icterus OROPHARYNX: no thrush or ulceration; good dentition  NECK: supple, no masses felt LYMPH:  no  palpable lymphadenopathy in the cervical, axillary or inguinal regions LUNGS: clear to auscultation and  No wheeze or crackles HEART/CVS: regular rate & rhythm and no murmurs; No lower extremity edema ABDOMEN: abdomen soft, non-tender and normal bowel sounds; positive for splenomegaly [4 fingers breath below costal margin] Musculoskeletal:no cyanosis of digits and no clubbing  PSYCH: alert & oriented x 3 with fluent speech NEURO: no focal motor/sensory deficits SKIN:  no rashes or significant lesions  LABORATORY DATA:  I have reviewed the data as listed Lab Results  Component Value Date   WBC 5.0 05/21/2014   HGB 14.9 05/21/2014   HCT 45.0 05/21/2014   MCV 91.1 05/21/2014   PLT 137* 05/21/2014   No results for input(s): NA, K, CL, CO2, GLUCOSE, BUN, CREATININE, CALCIUM, GFRNONAA, GFRAA, PROT, ALBUMIN, AST, ALT, ALKPHOS, BILITOT, BILIDIR, IBILI in the last 8760 hours.  RADIOGRAPHIC STUDIES: I have personally reviewed the radiological images as listed and agreed with the findings in the report. No results found.  ASSESSMENT & PLAN:   #  Mild anemia hemoglobin 12/ mild thrombocytopenia platelets  Around 137.  The etiology is unclear-  However  Appears to have a slow/ gradual process-  Like MDS versus  Low-grade lymphoma [ given the mild splenomegaly].  I would recommend checking CBC CMP LDH iron studies A91 folic acid.  Also recommend myeloma workup.  Also recommend ultrasound of the abdomen to further  Evaluate for  hepatosplenomegaly.  #  I also discussed with the patient that based upon the above workup  He might  Need a bone marrow biopsy/ with aspiration.  However I would wait further workup to recommend any invasive procedures.   The above plan of care was discussed the patient in detail.  All questions answered. The patient will follow-up with me in approximately one 1week/ to review the above results.  Thank you Dr.Grandis for allowing me to participate in the care of your pleasant patient. Please do not hesitate to contact me with questions or concerns in the interim.    # 45 minutes face-to-face with the patient discussing the above plan of care; more than 50% of time spent on prognosis/ natural history; counseling and coordination.      Cammie Sickle, MD 06/09/2015 11:27 AM

## 2015-06-09 NOTE — Progress Notes (Signed)
Patient here to day for ITP. States he has had "some fatigue". Denies any pain, SOB or bleeding.

## 2015-06-10 LAB — KAPPA/LAMBDA LIGHT CHAINS
KAPPA, LAMDA LIGHT CHAIN RATIO: 1.41 (ref 0.26–1.65)
Kappa free light chain: 10.1 mg/L (ref 3.30–19.40)
Lambda free light chains: 7.14 mg/L (ref 5.71–26.30)

## 2015-06-10 LAB — SOLUBLE TRANSFERRIN RECEPTOR: TRANSFERRIN RECEPTOR: 17.2 nmol/L (ref 12.2–27.3)

## 2015-06-10 LAB — HIV ANTIBODY (ROUTINE TESTING W REFLEX): HIV Screen 4th Generation wRfx: NONREACTIVE

## 2015-06-10 LAB — HEPATITIS B SURFACE ANTIGEN: Hepatitis B Surface Ag: NEGATIVE

## 2015-06-10 LAB — HEPATITIS C ANTIBODY

## 2015-06-12 LAB — MULTIPLE MYELOMA PANEL, SERUM
ALBUMIN SERPL ELPH-MCNC: 3.8 g/dL (ref 2.9–4.4)
ALPHA 1: 0.2 g/dL (ref 0.0–0.4)
ALPHA2 GLOB SERPL ELPH-MCNC: 0.4 g/dL (ref 0.4–1.0)
Albumin/Glob SerPl: 2.6 — ABNORMAL HIGH (ref 0.7–1.7)
B-GLOBULIN SERPL ELPH-MCNC: 0.8 g/dL (ref 0.7–1.3)
Gamma Glob SerPl Elph-Mcnc: 0.1 g/dL — ABNORMAL LOW (ref 0.4–1.8)
Globulin, Total: 1.5 g/dL — ABNORMAL LOW (ref 2.2–3.9)
IGG (IMMUNOGLOBIN G), SERUM: 130 mg/dL — AB (ref 700–1600)
IGM, SERUM: 8 mg/dL — AB (ref 15–143)
IgA: 12 mg/dL — ABNORMAL LOW (ref 61–437)
TOTAL PROTEIN ELP: 5.3 g/dL — AB (ref 6.0–8.5)

## 2015-06-15 ENCOUNTER — Ambulatory Visit
Admission: RE | Admit: 2015-06-15 | Discharge: 2015-06-15 | Disposition: A | Discharge status: Home / Self Care | Payer: Medicare Other | Source: Ambulatory Visit | Attending: Internal Medicine | Admitting: Internal Medicine

## 2015-06-15 DIAGNOSIS — R161 Splenomegaly, not elsewhere classified: Secondary | ICD-10-CM | POA: Diagnosis not present

## 2015-06-15 DIAGNOSIS — Z9049 Acquired absence of other specified parts of digestive tract: Secondary | ICD-10-CM | POA: Insufficient documentation

## 2015-06-15 DIAGNOSIS — D696 Thrombocytopenia, unspecified: Secondary | ICD-10-CM | POA: Diagnosis present

## 2015-06-15 DIAGNOSIS — D649 Anemia, unspecified: Secondary | ICD-10-CM | POA: Diagnosis present

## 2015-06-16 ENCOUNTER — Inpatient Hospital Stay: Payer: Medicare Other | Attending: Internal Medicine | Admitting: Internal Medicine

## 2015-06-16 VITALS — BP 134/71 | HR 64 | Temp 97.6°F | Resp 18 | Wt 168.9 lb

## 2015-06-16 DIAGNOSIS — R918 Other nonspecific abnormal finding of lung field: Secondary | ICD-10-CM | POA: Insufficient documentation

## 2015-06-16 DIAGNOSIS — D649 Anemia, unspecified: Secondary | ICD-10-CM | POA: Diagnosis not present

## 2015-06-16 DIAGNOSIS — N182 Chronic kidney disease, stage 2 (mild): Secondary | ICD-10-CM | POA: Insufficient documentation

## 2015-06-16 DIAGNOSIS — D696 Thrombocytopenia, unspecified: Secondary | ICD-10-CM | POA: Diagnosis not present

## 2015-06-16 DIAGNOSIS — Z7982 Long term (current) use of aspirin: Secondary | ICD-10-CM | POA: Insufficient documentation

## 2015-06-16 DIAGNOSIS — D801 Nonfamilial hypogammaglobulinemia: Secondary | ICD-10-CM | POA: Diagnosis not present

## 2015-06-16 DIAGNOSIS — I5022 Chronic systolic (congestive) heart failure: Secondary | ICD-10-CM | POA: Insufficient documentation

## 2015-06-16 DIAGNOSIS — N400 Enlarged prostate without lower urinary tract symptoms: Secondary | ICD-10-CM | POA: Diagnosis not present

## 2015-06-16 DIAGNOSIS — I252 Old myocardial infarction: Secondary | ICD-10-CM | POA: Diagnosis not present

## 2015-06-16 DIAGNOSIS — K219 Gastro-esophageal reflux disease without esophagitis: Secondary | ICD-10-CM | POA: Insufficient documentation

## 2015-06-16 DIAGNOSIS — I129 Hypertensive chronic kidney disease with stage 1 through stage 4 chronic kidney disease, or unspecified chronic kidney disease: Secondary | ICD-10-CM

## 2015-06-16 DIAGNOSIS — R161 Splenomegaly, not elsewhere classified: Secondary | ICD-10-CM | POA: Diagnosis not present

## 2015-06-16 DIAGNOSIS — E785 Hyperlipidemia, unspecified: Secondary | ICD-10-CM

## 2015-06-16 DIAGNOSIS — Z8673 Personal history of transient ischemic attack (TIA), and cerebral infarction without residual deficits: Secondary | ICD-10-CM | POA: Diagnosis not present

## 2015-06-16 DIAGNOSIS — Z79899 Other long term (current) drug therapy: Secondary | ICD-10-CM | POA: Diagnosis not present

## 2015-06-16 DIAGNOSIS — I255 Ischemic cardiomyopathy: Secondary | ICD-10-CM | POA: Insufficient documentation

## 2015-06-16 DIAGNOSIS — R5382 Chronic fatigue, unspecified: Secondary | ICD-10-CM | POA: Diagnosis not present

## 2015-06-16 DIAGNOSIS — I739 Peripheral vascular disease, unspecified: Secondary | ICD-10-CM | POA: Diagnosis not present

## 2015-06-16 DIAGNOSIS — G4733 Obstructive sleep apnea (adult) (pediatric): Secondary | ICD-10-CM | POA: Diagnosis not present

## 2015-06-16 DIAGNOSIS — I2510 Atherosclerotic heart disease of native coronary artery without angina pectoris: Secondary | ICD-10-CM | POA: Insufficient documentation

## 2015-06-16 DIAGNOSIS — Z801 Family history of malignant neoplasm of trachea, bronchus and lung: Secondary | ICD-10-CM | POA: Insufficient documentation

## 2015-06-16 NOTE — Progress Notes (Signed)
[place] NOTE  Patient Care Team: Shela Nevin, MD as Attending Physician (Cardiology)  CHIEF COMPLAINTS/PURPOSE OF CONSULTATION:   # 2014- THROMBOCYTOPENIA [130s-140s]; Feb- 2017- 107/N-Hb [hep B/C/HIV- Neg]; US-moderate splenomegaly.   HISTORY OF PRESENTING ILLNESS:  Gary Ferrell 80 y.o.  male Caucasian patient is here to review the results of his blood work that were ordered for his mild thrombocytopenia.  Patient continues to deny any unusual bleeding. Otherwise, he feels okay. Denies any night sweats or fevers.   ROS: A complete 10 point review of system is done which is negative except mentioned above in history of present illness  MEDICAL HISTORY:  Past Medical History  Diagnosis Date  . Hypertension   . Hyperlipidemia   . Coronary artery disease     a. 2007 s/p MI & CABG.  . Lung nodules   . GERD (gastroesophageal reflux disease)   . Spinal stenosis   . CKD (chronic kidney disease), stage II   . Chronic systolic CHF (congestive heart failure) ([place])     a. 04/2013 Echo: EF 35%, inf DK, inflat AK, diast dysfxn, RV HK, mild MR, nmildly dil LA.  . Stroke [facility])     a. 2007 Periop CABG - no residual.  . Ischemic cardiomyopathy     a. 04/2013 Echo: EF 35%;  b. 06/2013 s/p SJM Bi-V ICD, ser # [id number].  Marland Kitchen PAD (peripheral artery disease) ([place])     a. 1990's s/p Aortobifem bypass.  . Carotid disease, bilateral ([place])     a. 1990's s/p L CEA;  b. 11/2004 s/p redo L CEA;  c. 06/2013 Carotid U/S: RICA 63-84%, LICA patent.  Marland Kitchen BPH (benign prostatic hyperplasia)     a. 06/2012 s/p Greenlight Laser Prostatectomy.  . Stricture of urethral meatus     a. 08/2009 s/p dil.  Marland Kitchen LBBB (left bundle branch block)   . Diverticulosis     a. 10/2013 colonoscopy (otw nl study).  . Myocardial infarction ([place])   . AICD (automatic cardioverter/defibrillator) present   . OSA (obstructive sleep apnea)     a. 12/2013 Sleep study: severe OSA - CPAP-     SURGICAL  HISTORY: Past Surgical History  Procedure Laterality Date  . Cardiac catheterization  2007; 2012    "unsuccessful attempt to put stents in in 2007, had MI, had to have CABG;"  . Coronary artery bypass graft  2007    "CABG X3" (06/25/2013)  . Carotid endarterectomy Left 1992; 2006  . Appendectomy  1964  . Aorta - bilateral femoral artery bypass graft  1992  . Cholecystectomy  ~ 1996  . Colon surgery  2007    herniated intestinal obstruction  . Skin graft  2008    "area of colon surgery wouldn't heal; had this graft over area"  . Ventral hernia repair  2008    ventral epigastric hernia repair [Other]  . Urethral meatoplasty  08/2009  . Green light laser turp (transurethral resection of prostate N/A 07/02/2012    Procedure: GREEN LIGHT LASER TURP (TRANSURETHRAL RESECTION OF PROSTATE;  Surgeon: Ailene Rud, MD;  Location: [facility]S;  Service: Urology;  Laterality: N/A;  . Bi-ventricular implantable cardioverter defibrillator  (crt-d)  06/25/2013  . Tonsillectomy    . Urethral stricture dilatation  02/2010; 09/2010  . Bi-ventricular implantable cardioverter defibrillator  (crt-d)  06/25/13    STJ CRTD implanted by Dr Lovena Le  . Bi-ventricular implantable cardioverter defibrillator N/A 06/25/2013    Procedure: BI-VENTRICULAR IMPLANTABLE CARDIOVERTER DEFIBRILLATOR  (  CRT-D);  Surgeon: Evans Lance, MD;  Location: [facility] CATH LAB;  Service: Cardiovascular;  Laterality: N/A;    SOCIAL HISTORY:  Patient lives in  [place];   Quit smoking 1992;  No alcohol. He lives alone.  Retired Medical illustrator. Social History   Social History  . Marital Status: Married    Spouse Name: N/A  . Number of Children: 1  . Years of Education: N/A   Occupational History  . Retired    Social History Main Topics  . Smoking status: Former Smoker -- 1.00 packs/day for 20 years    Types: Cigarettes    Quit date: 04/11/1989  . Smokeless tobacco: Never Used  . Alcohol Use: No  . Drug Use: No  . Sexual Activity:  No   Other Topics Concern  . Not on file   Social History Narrative    FAMILY HISTORY: Family History  Problem Relation Age of Onset  . Pancreatic cancer Father   . Diabetes Brother   . Heart disease Brother   . Colon cancer Maternal Grandmother   . Parkinson's disease Mother   . Lung cancer Brother     stage 4    ALLERGIES:  is allergic to dapsone and gluten meal.  MEDICATIONS:  Current Outpatient Prescriptions  Medication Sig Dispense Refill  . aspirin 325 MG tablet Take 325 mg by mouth daily.    . carvedilol (COREG) 3.125 MG tablet TAKE 1 TABLET BY MOUTH 2 (TWO) TIMES DAILY WITH MEALS. 180 tablet 1  . cholecalciferol (VITAMIN D) 1000 UNITS tablet Take 1,000 Units by mouth daily.    . enalapril (VASOTEC) 5 MG tablet Take 2.5 mg by mouth every morning.     . methylcellulose (ARTIFICIAL TEARS) 1 % ophthalmic solution Place 1-2 drops into both eyes daily as needed (dry eyes).    . Multiple Vitamin (MULTIVITAMIN WITH MINERALS) TABS Take 1 tablet by mouth daily.    . Omega-3 Fatty Acids (FISH OIL PO) Take 1 capsule by mouth daily.    Marland Kitchen omeprazole (PRILOSEC OTC) 20 MG tablet Take 20 mg by mouth daily as needed (acid reflux). Reported on 06/09/2015    . rosuvastatin (CRESTOR) 20 MG tablet Take 20 mg by mouth daily.    . white petrolatum (VASELINE) GEL Apply 1 application topically 2 (two) times daily. Applied to legs     No current facility-administered medications for this visit.      Marland Kitchen  PHYSICAL EXAMINATION: ECOG PERFORMANCE STATUS: 0 - Asymptomatic  Filed Vitals:   06/16/15 1022  BP: 134/71  Pulse: 64  Temp: 97.6 F (36.4 C)  Resp: 18   Filed Weights   06/16/15 1022  Weight: 168 lb 14 oz (76.6 kg)    GENERAL: Well-nourished well-developed; Alert, no distress and comfortable.  Alone   LABORATORY DATA:  I have reviewed the data as listed Lab Results  Component Value Date   WBC 3.6* 06/09/2015   HGB 13.3 06/09/2015   HCT 39.9* 06/09/2015   MCV 87.8  06/09/2015   PLT 104* 06/09/2015   No results for input(s): NA, K, CL, CO2, GLUCOSE, BUN, CREATININE, CALCIUM, GFRNONAA, GFRAA, PROT, ALBUMIN, AST, ALT, ALKPHOS, BILITOT, BILIDIR, IBILI in the last 8760 hours.  RADIOGRAPHIC STUDIES: I have personally reviewed the radiological images as listed and agreed with the findings in the report. US Abdomen Complete  06/15/2015  CLINICAL DATA:  Splenomegaly. Thrombocytopenia. A anemia. Cholecystectomy. EXAM: ABDOMEN ULTRASOUND COMPLETE COMPARISON:  Report from 10/03/2000 CT abdomen/ pelvis (images not available).  FINDINGS: Gallbladder: No gallstones or wall thickening visualized. No sonographic Murphy sign noted by sonographer. Common bile duct: Diameter: 5 mm Liver: No focal lesion identified. Within normal limits in parenchymal echogenicity. IVC: No abnormality visualized. Pancreas: Normal limited visualized portions. Spleen: Mild-to-moderate splenomegaly with approximate splenic volume of 936 cc (14.8 cm craniocaudal by 17.0 x 7.1 cm axial dimensions). No splenic mass. Separate small 1.9 cm splenule. Right Kidney: Length: 11.2 cm. Echogenicity within normal limits. No mass or hydronephrosis visualized. Left Kidney: Length: 12.0 cm. Echogenicity within normal limits. No mass or hydronephrosis visualized. Abdominal aorta: No aneurysm visualized. Other findings: None. IMPRESSION: 1. Mild to moderate splenomegaly. 2. Otherwise normal abdominal sonogram status postcholecystectomy. Electronically Signed   By: Ilona Sorrel M.D.   On: 06/15/2015 11:22    ASSESSMENT & PLAN:   #  Mild chronic thrombocytopenia- patient's platelets today are 107; that dropped from 130s approximately one and half year ago. Hemoglobin is normal white count is slightly low at 3.6. Ultrasound-moderate splenomegaly.  # The differential diagnosis for about includes low-grade lymphoma/hairy cell leukemia versus other causes. Recommend a bone marrow biopSY- in radiology. Discussed the potential  complications of bleeding/ infection and pain with the procedure. He prefers to have it only under local anesthesia.   # Patient follow-up with me in approximately 2 weeks after the procedure.  # 15 minutes face-to-face with the patient discussing the above plan of care; more than 50% of time spent on natural history; counseling and coordination.       Gary Sickle, MD 06/16/2015 10:26 AM

## 2015-06-16 NOTE — Progress Notes (Signed)
Pt here for U/S of abdomen results. Seen for anemia and thrombocytopenia. Denies pain. Eating normally. Maintaining weight. Reports memory problems.

## 2015-06-18 ENCOUNTER — Telehealth: Payer: Self-pay | Admitting: *Deleted

## 2015-06-18 NOTE — Telephone Encounter (Signed)
Called to tell pt that his bx. Is 06/24/15 in [place] at [facility]. It will be arrive at 10 am for procedure at 11 am.  He will check in at medical mall and stop at first desk on right which is adm. And reg. Desk.  They will check him in and take him to where he will have proc. At. He will have to be NPO for 8 hours and any meds with just a sip of water.  He asked about a ride if it is needed because he would have to rely on his son who is a Pharmacist, hospital and not sure if he could get off work.  He just wants local numbing that dr B spoke to him of. I told him I will check and let him know.  Also he should come back to [facility] for f/u 3/28 and I have sent message to Mickel Baas to sch. It and let me know and I will tell him. He is agreeable to plan

## 2015-06-19 ENCOUNTER — Other Ambulatory Visit: Payer: Self-pay | Admitting: Internal Medicine

## 2015-06-23 ENCOUNTER — Other Ambulatory Visit: Payer: Self-pay | Admitting: Radiology

## 2015-06-24 ENCOUNTER — Ambulatory Visit
Admission: RE | Admit: 2015-06-24 | Discharge: 2015-06-24 | Disposition: A | Discharge status: Home / Self Care | Payer: Medicare Other | Source: Ambulatory Visit | Attending: Internal Medicine | Admitting: Internal Medicine

## 2015-06-24 DIAGNOSIS — Z8249 Family history of ischemic heart disease and other diseases of the circulatory system: Secondary | ICD-10-CM | POA: Diagnosis not present

## 2015-06-24 DIAGNOSIS — Z801 Family history of malignant neoplasm of trachea, bronchus and lung: Secondary | ICD-10-CM | POA: Insufficient documentation

## 2015-06-24 DIAGNOSIS — I255 Ischemic cardiomyopathy: Secondary | ICD-10-CM | POA: Diagnosis not present

## 2015-06-24 DIAGNOSIS — Z833 Family history of diabetes mellitus: Secondary | ICD-10-CM | POA: Diagnosis not present

## 2015-06-24 DIAGNOSIS — Z950 Presence of cardiac pacemaker: Secondary | ICD-10-CM | POA: Diagnosis not present

## 2015-06-24 DIAGNOSIS — N182 Chronic kidney disease, stage 2 (mild): Secondary | ICD-10-CM | POA: Insufficient documentation

## 2015-06-24 DIAGNOSIS — I5022 Chronic systolic (congestive) heart failure: Secondary | ICD-10-CM | POA: Insufficient documentation

## 2015-06-24 DIAGNOSIS — K5790 Diverticulosis of intestine, part unspecified, without perforation or abscess without bleeding: Secondary | ICD-10-CM | POA: Insufficient documentation

## 2015-06-24 DIAGNOSIS — Z951 Presence of aortocoronary bypass graft: Secondary | ICD-10-CM | POA: Diagnosis not present

## 2015-06-24 DIAGNOSIS — Z8673 Personal history of transient ischemic attack (TIA), and cerebral infarction without residual deficits: Secondary | ICD-10-CM | POA: Insufficient documentation

## 2015-06-24 DIAGNOSIS — Z800 Family history of malignant neoplasm of digestive organs: Secondary | ICD-10-CM | POA: Insufficient documentation

## 2015-06-24 DIAGNOSIS — I739 Peripheral vascular disease, unspecified: Secondary | ICD-10-CM | POA: Insufficient documentation

## 2015-06-24 DIAGNOSIS — E785 Hyperlipidemia, unspecified: Secondary | ICD-10-CM | POA: Diagnosis not present

## 2015-06-24 DIAGNOSIS — N400 Enlarged prostate without lower urinary tract symptoms: Secondary | ICD-10-CM | POA: Diagnosis not present

## 2015-06-24 DIAGNOSIS — I130 Hypertensive heart and chronic kidney disease with heart failure and stage 1 through stage 4 chronic kidney disease, or unspecified chronic kidney disease: Secondary | ICD-10-CM | POA: Insufficient documentation

## 2015-06-24 DIAGNOSIS — I252 Old myocardial infarction: Secondary | ICD-10-CM | POA: Diagnosis not present

## 2015-06-24 DIAGNOSIS — Z7982 Long term (current) use of aspirin: Secondary | ICD-10-CM | POA: Insufficient documentation

## 2015-06-24 DIAGNOSIS — D696 Thrombocytopenia, unspecified: Secondary | ICD-10-CM

## 2015-06-24 DIAGNOSIS — I2510 Atherosclerotic heart disease of native coronary artery without angina pectoris: Secondary | ICD-10-CM | POA: Insufficient documentation

## 2015-06-24 HISTORY — DX: Presence of cardiac pacemaker: Z95.0

## 2015-06-24 LAB — DIFFERENTIAL
BAND NEUTROPHILS: 0 %
BASOS PCT: 0 %
Basophils Absolute: 0 10*3/uL (ref 0–0.1)
Blasts: 0 %
EOS ABS: 0 10*3/uL (ref 0–0.7)
Eosinophils Relative: 0 %
LYMPHS ABS: 0.6 10*3/uL — AB (ref 1.0–3.6)
Lymphocytes Relative: 18 %
METAMYELOCYTES PCT: 0 %
MONO ABS: 0.5 10*3/uL (ref 0.2–1.0)
MYELOCYTES: 0 %
Monocytes Relative: 15 %
NEUTROS ABS: 2.5 10*3/uL (ref 1.4–6.5)
NRBC: 0 /100{WBCs}
Neutrophils Relative %: 67 %
OTHER: 0 %
PROMYELOCYTES ABS: 0 %

## 2015-06-24 LAB — CBC
HEMATOCRIT: 41.2 % (ref 40.0–52.0)
HEMOGLOBIN: 13.7 g/dL (ref 13.0–18.0)
MCH: 29.4 pg (ref 26.0–34.0)
MCHC: 33.2 g/dL (ref 32.0–36.0)
MCV: 88.6 fL (ref 80.0–100.0)
Platelets: 94 10*3/uL — ABNORMAL LOW (ref 150–440)
RBC: 4.65 MIL/uL (ref 4.40–5.90)
RDW: 14.9 % — AB (ref 11.5–14.5)
WBC: 3.6 10*3/uL — ABNORMAL LOW (ref 3.8–10.6)

## 2015-06-24 LAB — PROTIME-INR
INR: 1.14
Prothrombin Time: 14.8 seconds (ref 11.4–15.0)

## 2015-06-24 LAB — APTT: APTT: 28 s (ref 24–36)

## 2015-06-24 MED ORDER — HYDROCODONE-ACETAMINOPHEN 5-325 MG PO TABS: 1.0000 | ORAL_TABLET | ORAL | Status: DC | PRN

## 2015-06-24 MED ORDER — FENTANYL CITRATE (PF) 100 MCG/2ML IJ SOLN
INTRAMUSCULAR | Status: AC | PRN
  Administered 2015-06-24: 50 ug via INTRAVENOUS

## 2015-06-24 MED ORDER — SODIUM CHLORIDE 0.9 % IV SOLN
Freq: Once | INTRAVENOUS | Status: AC
  Administered 2015-06-24: 12:00:00 via INTRAVENOUS

## 2015-06-24 MED ORDER — MIDAZOLAM HCL 5 MG/5ML IJ SOLN
INTRAMUSCULAR | Status: AC | PRN
  Administered 2015-06-24 (×2): 1 mg via INTRAVENOUS

## 2015-06-24 NOTE — Procedures (Signed)
Pt discharged to home.  Instructions given.  Son with the patient

## 2015-06-24 NOTE — H&P (Signed)
Chief Complaint: Patient was seen in consultation today for a bone marrow biopsy at the request of Brahmanday,Govinda R  Referring Physician(s): Brahmanday,Govinda R  History of Present Illness: Gary Ferrell is a 80 y.o. male with chronic mild thrombocytopenia.  Patient has no complaints today.  Past medical history is significant for coronary artery disease and prior stroke.  Patient's biggest concern is an irregular mole on his forehead that he forgot to mention to his dermatologist.  No change in appetite or weight.      Past Medical History  Diagnosis Date  . Hypertension   . Hyperlipidemia   . Coronary artery disease     a. 2007 s/p MI & CABG.  . Lung nodules   . GERD (gastroesophageal reflux disease)   . Spinal stenosis   . CKD (chronic kidney disease), stage II   . Chronic systolic CHF (congestive heart failure) ([place])     a. 04/2013 Echo: EF 35%, inf DK, inflat AK, diast dysfxn, RV HK, mild MR, nmildly dil LA.  . Stroke [facility])     a. 2007 Periop CABG - no residual.  . Ischemic cardiomyopathy     a. 04/2013 Echo: EF 35%;  b. 06/2013 s/p SJM Bi-V ICD, ser # [id number].  Marland Kitchen PAD (peripheral artery disease) ([place])     a. 1990's s/p Aortobifem bypass.  . Carotid disease, bilateral ([place])     a. 1990's s/p L CEA;  b. 11/2004 s/p redo L CEA;  c. 06/2013 Carotid U/S: RICA 33-00%, LICA patent.  Marland Kitchen BPH (benign prostatic hyperplasia)     a. 06/2012 s/p Greenlight Laser Prostatectomy.  . Stricture of urethral meatus     a. 08/2009 s/p dil.  Marland Kitchen LBBB (left bundle branch block)   . Diverticulosis     a. 10/2013 colonoscopy (otw nl study).  . Myocardial infarction ([place])   . AICD (automatic cardioverter/defibrillator) present   . OSA (obstructive sleep apnea)     a. 12/2013 Sleep study: severe OSA - CPAP-   . Presence of permanent cardiac pacemaker     Past Surgical History  Procedure Laterality Date  . Cardiac catheterization  2007; 2012    "unsuccessful attempt to put stents in in 2007,  had MI, had to have CABG;"  . Coronary artery bypass graft  2007    "CABG X3" (06/25/2013)  . Carotid endarterectomy Left 1992; 2006  . Appendectomy  1964  . Aorta - bilateral femoral artery bypass graft  1992  . Cholecystectomy  ~ 1996  . Colon surgery  2007    herniated intestinal obstruction  . Skin graft  2008    "area of colon surgery wouldn't heal; had this graft over area"  . Ventral hernia repair  2008    ventral epigastric hernia repair [Other]  . Urethral meatoplasty  08/2009  . Green light laser turp (transurethral resection of prostate N/A 07/02/2012    Procedure: GREEN LIGHT LASER TURP (TRANSURETHRAL RESECTION OF PROSTATE;  Surgeon: Ailene Rud, MD;  Location: [facility];  Service: Urology;  Laterality: N/A;  . Bi-ventricular implantable cardioverter defibrillator  (crt-d)  06/25/2013  . Tonsillectomy    . Urethral stricture dilatation  02/2010; 09/2010  . Bi-ventricular implantable cardioverter defibrillator  (crt-d)  06/25/13    STJ CRTD implanted by Dr Lovena Le  . Bi-ventricular implantable cardioverter defibrillator N/A 06/25/2013    Procedure: BI-VENTRICULAR IMPLANTABLE CARDIOVERTER DEFIBRILLATOR  (CRT-D);  Surgeon: Evans Lance, MD;  Location: [facility] CATH LAB;  Service:  Cardiovascular;  Laterality: N/A;    Allergies: Dapsone and Gluten meal  Medications: Prior to Admission medications   Medication Sig Start Date End Date Taking? Authorizing Provider  aspirin 325 MG tablet Take 325 mg by mouth daily.   Yes Historical Provider, MD  carvedilol (COREG) 3.125 MG tablet TAKE 1 TABLET BY MOUTH 2 (TWO) TIMES DAILY WITH MEALS. 08/29/14  Yes Evans Lance, MD  cholecalciferol (VITAMIN D) 1000 UNITS tablet Take 1,000 Units by mouth daily.   Yes Historical Provider, MD  enalapril (VASOTEC) 5 MG tablet Take 2.5 mg by mouth every morning.  05/06/13  Yes Historical Provider, MD  methylcellulose (ARTIFICIAL TEARS) 1 % ophthalmic solution Place 1-2 drops into both eyes daily as needed  (dry eyes).   Yes Historical Provider, MD  Multiple Vitamin (MULTIVITAMIN WITH MINERALS) TABS Take 1 tablet by mouth daily.   Yes Historical Provider, MD  Omega-3 Fatty Acids (FISH OIL PO) Take 1 capsule by mouth daily.   Yes Historical Provider, MD  rosuvastatin (CRESTOR) 20 MG tablet Take 20 mg by mouth daily. Reported on 06/16/2015   Yes Historical Provider, MD  white petrolatum (VASELINE) GEL Apply 1 application topically 2 (two) times daily. Applied to legs   Yes Historical Provider, MD  omeprazole (PRILOSEC OTC) 20 MG tablet Take 20 mg by mouth daily as needed (acid reflux). Reported on 06/24/2015    Historical Provider, MD     Family History  Problem Relation Age of Onset  . Pancreatic cancer Father   . Diabetes Brother   . Heart disease Brother   . Colon cancer Maternal Grandmother   . Parkinson's disease Mother   . Lung cancer Brother     stage 4    Social History   Social History  . Marital Status: Married    Spouse Name: N/A  . Number of Children: 1  . Years of Education: N/A   Occupational History  . Retired    Social History Main Topics  . Smoking status: Former Smoker -- 1.00 packs/day for 20 years    Types: Cigarettes    Quit date: 04/11/1989  . Smokeless tobacco: Never Used  . Alcohol Use: No  . Drug Use: No  . Sexual Activity: No   Other Topics Concern  . None   Social History Narrative     Review of Systems  Constitutional: Negative for fever, chills, activity change and appetite change.  Respiratory: Positive for chest tightness. Negative for cough and shortness of breath.   Cardiovascular: Negative for chest pain.    Vital Signs: BP 125/54 mmHg  Pulse 64  Temp(Src) 98 F (36.7 C)  Resp 23  SpO2 100%  Physical Exam  Constitutional: He appears well-developed and well-nourished.  Cardiovascular: Normal rate, regular rhythm and normal heart sounds.   Pulmonary/Chest: Effort normal and breath sounds normal.  Abdominal: Soft. Bowel sounds are  normal. He exhibits no distension. There is no tenderness.    Mallampati Score:  MD Evaluation Airway: WNL Heart: WNL Abdomen: WNL Chest/ Lungs: WNL ASA  Classification: 2 Mallampati/Airway Score: Two  Imaging: US Abdomen Complete  06/15/2015  CLINICAL DATA:  Splenomegaly. Thrombocytopenia. A anemia. Cholecystectomy. EXAM: ABDOMEN ULTRASOUND COMPLETE COMPARISON:  Report from 10/03/2000 CT abdomen/ pelvis (images not available). FINDINGS: Gallbladder: No gallstones or wall thickening visualized. No sonographic Murphy sign noted by sonographer. Common bile duct: Diameter: 5 mm Liver: No focal lesion identified. Within normal limits in parenchymal echogenicity. IVC: No abnormality visualized. Pancreas: Normal limited visualized portions.  Spleen: Mild-to-moderate splenomegaly with approximate splenic volume of 936 cc (14.8 cm craniocaudal by 17.0 x 7.1 cm axial dimensions). No splenic mass. Separate small 1.9 cm splenule. Right Kidney: Length: 11.2 cm. Echogenicity within normal limits. No mass or hydronephrosis visualized. Left Kidney: Length: 12.0 cm. Echogenicity within normal limits. No mass or hydronephrosis visualized. Abdominal aorta: No aneurysm visualized. Other findings: None. IMPRESSION: 1. Mild to moderate splenomegaly. 2. Otherwise normal abdominal sonogram status postcholecystectomy. Electronically Signed   By: Ilona Sorrel M.D.   On: 06/15/2015 11:22    Labs:  CBC:  Recent Labs  06/09/15 1154 06/24/15 1054  WBC 3.6* 3.6*  HGB 13.3 13.7  HCT 39.9* 41.2  PLT 104* 94*    COAGS:  Recent Labs  06/24/15 1054  INR 1.14  APTT 28    BMP: No results for input(s): NA, K, CL, CO2, GLUCOSE, BUN, CALCIUM, CREATININE, GFRNONAA, GFRAA in the last 8760 hours.  Invalid input(s): CMP  LIVER FUNCTION TESTS: No results for input(s): BILITOT, AST, ALT, ALKPHOS, PROT, ALBUMIN in the last 8760 hours.  TUMOR MARKERS: No results for input(s): AFPTM, CEA, CA199, CHROMGRNA in the last  8760 hours.  Assessment and Plan:  80 yo with mild thrombocytopenia and needs bone marrow biopsy for additional evaluation.  CT guided biopsy discussed with patient.  Risks include bleeding and infection.  Informed consent obtained from patient.  Plan for CT guided bone marrow biopsy with moderate sedation.  Recommended patient follow up with his dermatologist to evaluate the forehead mole.      Electronically Signed: Carylon Perches 06/24/2015, 11:30 AM

## 2015-06-24 NOTE — Procedures (Signed)
CT guided bone marrow biopsies.  2 aspirates and 2 cores obtained.  No immediate complication and minimal blood loss.

## 2015-07-07 ENCOUNTER — Inpatient Hospital Stay (HOSPITAL_BASED_OUTPATIENT_CLINIC_OR_DEPARTMENT_OTHER): Payer: Medicare Other | Admitting: Internal Medicine

## 2015-07-07 VITALS — BP 134/70 | HR 62 | Temp 98.0°F | Resp 18 | Wt 171.1 lb

## 2015-07-07 DIAGNOSIS — D696 Thrombocytopenia, unspecified: Secondary | ICD-10-CM | POA: Diagnosis not present

## 2015-07-07 DIAGNOSIS — N182 Chronic kidney disease, stage 2 (mild): Secondary | ICD-10-CM

## 2015-07-07 DIAGNOSIS — R161 Splenomegaly, not elsewhere classified: Secondary | ICD-10-CM

## 2015-07-07 DIAGNOSIS — Z79899 Other long term (current) drug therapy: Secondary | ICD-10-CM

## 2015-07-07 DIAGNOSIS — D649 Anemia, unspecified: Secondary | ICD-10-CM | POA: Diagnosis not present

## 2015-07-07 DIAGNOSIS — E785 Hyperlipidemia, unspecified: Secondary | ICD-10-CM

## 2015-07-07 DIAGNOSIS — Z7982 Long term (current) use of aspirin: Secondary | ICD-10-CM

## 2015-07-07 DIAGNOSIS — R918 Other nonspecific abnormal finding of lung field: Secondary | ICD-10-CM

## 2015-07-07 DIAGNOSIS — R5382 Chronic fatigue, unspecified: Secondary | ICD-10-CM

## 2015-07-07 DIAGNOSIS — I2510 Atherosclerotic heart disease of native coronary artery without angina pectoris: Secondary | ICD-10-CM

## 2015-07-07 DIAGNOSIS — D801 Nonfamilial hypogammaglobulinemia: Secondary | ICD-10-CM | POA: Diagnosis not present

## 2015-07-07 DIAGNOSIS — I129 Hypertensive chronic kidney disease with stage 1 through stage 4 chronic kidney disease, or unspecified chronic kidney disease: Secondary | ICD-10-CM

## 2015-07-07 NOTE — Progress Notes (Signed)
[place] NOTE  Patient Care Team: Sharyne Peach, MD as PCP - General (Family Medicine) Shela Nevin, MD as Attending Physician (Cardiology)  CHIEF COMPLAINTS/PURPOSE OF CONSULTATION:   # 2014- THROMBOCYTOPENIA [130s-140s] sec to splenomegaly; Feb- 2017- 107/N-Hb [hep B/C/HIV- Neg]; US-moderate splenomegaly. March 2017- BMBx- Megakaryocytosis/ otherwise no obvious dyspoiesis/ or malignancy; no increase in reticulin fibrosis; cytogenetics- pending.    # 2017- ? Mod Splenomegaly   # Hypogammaglobinemia/Asymptomatic- incidental   HISTORY OF PRESENTING ILLNESS:  Gary Ferrell 80 y.o.  male Caucasian patient is here to review the results of his bone marrow that were ordered for his mild thrombocytopenia.  Patient continues to deny any unusual bleeding. Otherwise, he feels okay. Denies any night sweats or fevers. He denies any recent infections. No weight loss. Mild chronic fatigue not any worse.  ROS: A complete 10 point review of system is done which is negative except mentioned above in history of present illness  MEDICAL HISTORY:  Past Medical History  Diagnosis Date  . Hypertension   . Hyperlipidemia   . Coronary artery disease     a. 2007 s/p MI & CABG.  . Lung nodules   . GERD (gastroesophageal reflux disease)   . Spinal stenosis   . CKD (chronic kidney disease), stage II   . Chronic systolic CHF (congestive heart failure) ([place])     a. 04/2013 Echo: EF 35%, inf DK, inflat AK, diast dysfxn, RV HK, mild MR, nmildly dil LA.  . Stroke [facility])     a. 2007 Periop CABG - no residual.  . Ischemic cardiomyopathy     a. 04/2013 Echo: EF 35%;  b. 06/2013 s/p SJM Bi-V ICD, ser # [id number].  Marland Kitchen PAD (peripheral artery disease) ([place])     a. 1990's s/p Aortobifem bypass.  . Carotid disease, bilateral ([place])     a. 1990's s/p L CEA;  b. 11/2004 s/p redo L CEA;  c. 06/2013 Carotid U/S: RICA 27-74%, LICA patent.  Marland Kitchen BPH (benign prostatic hyperplasia)     a. 06/2012  s/p Greenlight Laser Prostatectomy.  . Stricture of urethral meatus     a. 08/2009 s/p dil.  Marland Kitchen LBBB (left bundle branch block)   . Diverticulosis     a. 10/2013 colonoscopy (otw nl study).  . Myocardial infarction ([place])   . AICD (automatic cardioverter/defibrillator) present   . OSA (obstructive sleep apnea)     a. 12/2013 Sleep study: severe OSA - CPAP-   . Presence of permanent cardiac pacemaker     SURGICAL HISTORY: Past Surgical History  Procedure Laterality Date  . Cardiac catheterization  2007; 2012    "unsuccessful attempt to put stents in in 2007, had MI, had to have CABG;"  . Coronary artery bypass graft  2007    "CABG X3" (06/25/2013)  . Carotid endarterectomy Left 1992; 2006  . Appendectomy  1964  . Aorta - bilateral femoral artery bypass graft  1992  . Cholecystectomy  ~ 1996  . Colon surgery  2007    herniated intestinal obstruction  . Skin graft  2008    "area of colon surgery wouldn't heal; had this graft over area"  . Ventral hernia repair  2008    ventral epigastric hernia repair [Other]  . Urethral meatoplasty  08/2009  . Green light laser turp (transurethral resection of prostate N/A 07/02/2012    Procedure: GREEN LIGHT LASER TURP (TRANSURETHRAL RESECTION OF PROSTATE;  Surgeon: Ailene Rud, MD;  Location: [facility] ORS;  Service: Urology;  Laterality: N/A;  . Bi-ventricular implantable cardioverter defibrillator  (crt-d)  06/25/2013  . Tonsillectomy    . Urethral stricture dilatation  02/2010; 09/2010  . Bi-ventricular implantable cardioverter defibrillator  (crt-d)  06/25/13    STJ CRTD implanted by Dr Lovena Le  . Bi-ventricular implantable cardioverter defibrillator N/A 06/25/2013    Procedure: BI-VENTRICULAR IMPLANTABLE CARDIOVERTER DEFIBRILLATOR  (CRT-D);  Surgeon: Evans Lance, MD;  Location: [facility] CATH LAB;  Service: Cardiovascular;  Laterality: N/A;    SOCIAL HISTORY:  Patient lives in  [place];   Quit smoking 1992;  No alcohol. He lives alone.  Retired  Medical illustrator. Social History   Social History  . Marital Status: Married    Spouse Name: N/A  . Number of Children: 1  . Years of Education: N/A   Occupational History  . Retired    Social History Main Topics  . Smoking status: Former Smoker -- 1.00 packs/day for 20 years    Types: Cigarettes    Quit date: 04/11/1989  . Smokeless tobacco: Never Used  . Alcohol Use: No  . Drug Use: No  . Sexual Activity: No   Other Topics Concern  . Not on file   Social History Narrative    FAMILY HISTORY: Family History  Problem Relation Age of Onset  . Pancreatic cancer Father   . Diabetes Brother   . Heart disease Brother   . Colon cancer Maternal Grandmother   . Parkinson's disease Mother   . Lung cancer Brother     stage 4    ALLERGIES:  is allergic to dapsone and gluten meal.  MEDICATIONS:  Current Outpatient Prescriptions  Medication Sig Dispense Refill  . aspirin 325 MG tablet Take 325 mg by mouth daily.    . carvedilol (COREG) 3.125 MG tablet TAKE 1 TABLET BY MOUTH 2 (TWO) TIMES DAILY WITH MEALS. 180 tablet 1  . cholecalciferol (VITAMIN D) 1000 UNITS tablet Take 1,000 Units by mouth daily.    . enalapril (VASOTEC) 5 MG tablet Take 2.5 mg by mouth every morning.     . methylcellulose (ARTIFICIAL TEARS) 1 % ophthalmic solution Place 1-2 drops into both eyes daily as needed (dry eyes).    . Multiple Vitamin (MULTIVITAMIN WITH MINERALS) TABS Take 1 tablet by mouth daily.    . Omega-3 Fatty Acids (FISH OIL PO) Take 1 capsule by mouth daily.    . rosuvastatin (CRESTOR) 20 MG tablet Take 20 mg by mouth daily. Reported on 06/16/2015    . white petrolatum (VASELINE) GEL Apply 1 application topically 2 (two) times daily. Applied to legs     No current facility-administered medications for this visit.      Marland Kitchen  PHYSICAL EXAMINATION: ECOG PERFORMANCE STATUS: 0 - Asymptomatic  Filed Vitals:   07/07/15 1110  BP: 134/70  Pulse: 62  Temp: 98 F (36.7 C)  Resp: 18   Filed  Weights   07/07/15 1110  Weight: 171 lb 1.2 oz (77.6 kg)    GENERAL: Well-nourished well-developed; Alert, no distress and comfortable.  Alone   LABORATORY DATA:  I have reviewed the data as listed Lab Results  Component Value Date   WBC 3.6* 06/24/2015   HGB 13.7 06/24/2015   HCT 41.2 06/24/2015   MCV 88.6 06/24/2015   PLT 94* 06/24/2015   No results for input(s): NA, K, CL, CO2, GLUCOSE, BUN, CREATININE, CALCIUM, GFRNONAA, GFRAA, PROT, ALBUMIN, AST, ALT, ALKPHOS, BILITOT, BILIDIR, IBILI in the last 8760 hours.  RADIOGRAPHIC STUDIES: I have  personally reviewed the radiological images as listed and agreed with the findings in the report. US Abdomen Complete  06/15/2015  CLINICAL DATA:  Splenomegaly. Thrombocytopenia. A anemia. Cholecystectomy. EXAM: ABDOMEN ULTRASOUND COMPLETE COMPARISON:  Report from 10/03/2000 CT abdomen/ pelvis (images not available). FINDINGS: Gallbladder: No gallstones or wall thickening visualized. No sonographic Murphy sign noted by sonographer. Common bile duct: Diameter: 5 mm Liver: No focal lesion identified. Within normal limits in parenchymal echogenicity. IVC: No abnormality visualized. Pancreas: Normal limited visualized portions. Spleen: Mild-to-moderate splenomegaly with approximate splenic volume of 936 cc (14.8 cm craniocaudal by 17.0 x 7.1 cm axial dimensions). No splenic mass. Separate small 1.9 cm splenule. Right Kidney: Length: 11.2 cm. Echogenicity within normal limits. No mass or hydronephrosis visualized. Left Kidney: Length: 12.0 cm. Echogenicity within normal limits. No mass or hydronephrosis visualized. Abdominal aorta: No aneurysm visualized. Other findings: None. IMPRESSION: 1. Mild to moderate splenomegaly. 2. Otherwise normal abdominal sonogram status postcholecystectomy. Electronically Signed   By: Ilona Sorrel M.D.   On: 06/15/2015 11:22   Ct Biopsy  06/24/2015  INDICATION: 80 year old with chronic mild thrombocytopenia. EXAM: CT GUIDED BONE  MARROW ASPIRATES AND BIOPSY Physician: Stephan Minister. Anselm Pancoast, MD MEDICATIONS: None. ANESTHESIA/SEDATION: Fentanyl 2.0 mcg IV; Versed 50 mg IV Moderate Sedation Time:  13 minutes The patient was continuously monitored during the procedure by the interventional radiology nurse under my direct supervision. COMPLICATIONS: None immediate. PROCEDURE: The procedure was explained to the patient. The risks and benefits of the procedure were discussed and the patient's questions were addressed. Informed consent was obtained from the patient. The patient was placed prone on CT scan. Images of the pelvis were obtained. The right side of back was prepped and draped in sterile fashion. The skin and right posterior iliac bone were anesthetized with 1% lidocaine. 11 gauge bone needle was directed into the right iliac bone with CT guidance. Two aspirates and 2 core biopsies were obtained. Bandage placed over the puncture site. FINDINGS: Patient has had aortobifemoral bypass graft. Bone needle directed into the posterior right ilium. IMPRESSION: CT guided bone marrow aspirates and core biopsy. Electronically Signed   By: Markus Daft M.D.   On: 06/24/2015 17:39    ASSESSMENT & PLAN:   #  Mild chronic thrombocytopenia- patient's platelets today are 107; that dropped from 130s approximately one and half year ago. Hemoglobin is normal white count is slightly low at 3.6. Ultrasound-moderate splenomegaly. Bone marrow biopsy- shows megakaryocytosis/ otherwise no obvious dyspoiesis/ or malignancy noted in the bone marrow.  # Patient is currently asymptomatic; recommend follow-up in 4 months; if platelets are dropping/splenomegaly getting worse- recommend ultrasound/ PET scan for further evaluation- as splenic lymphoma is still a possibility.  # Patient was given a copy of his labs/bone marrow.  # 15 minutes face-to-face with the patient discussing the above plan of care; more than 50% of time spent on natural history; counseling and  coordination.     Cammie Sickle, MD 07/07/2015 11:28 AM

## 2015-11-03 ENCOUNTER — Other Ambulatory Visit: Payer: Self-pay

## 2015-11-03 ENCOUNTER — Inpatient Hospital Stay (HOSPITAL_BASED_OUTPATIENT_CLINIC_OR_DEPARTMENT_OTHER): Payer: Medicare Other | Admitting: Internal Medicine

## 2015-11-03 ENCOUNTER — Inpatient Hospital Stay: Payer: Medicare Other | Attending: Internal Medicine

## 2015-11-03 DIAGNOSIS — Z8673 Personal history of transient ischemic attack (TIA), and cerebral infarction without residual deficits: Secondary | ICD-10-CM

## 2015-11-03 DIAGNOSIS — D696 Thrombocytopenia, unspecified: Secondary | ICD-10-CM

## 2015-11-03 DIAGNOSIS — E785 Hyperlipidemia, unspecified: Secondary | ICD-10-CM

## 2015-11-03 DIAGNOSIS — K5790 Diverticulosis of intestine, part unspecified, without perforation or abscess without bleeding: Secondary | ICD-10-CM | POA: Diagnosis not present

## 2015-11-03 DIAGNOSIS — I509 Heart failure, unspecified: Secondary | ICD-10-CM

## 2015-11-03 DIAGNOSIS — N182 Chronic kidney disease, stage 2 (mild): Secondary | ICD-10-CM | POA: Diagnosis not present

## 2015-11-03 DIAGNOSIS — Z7982 Long term (current) use of aspirin: Secondary | ICD-10-CM | POA: Insufficient documentation

## 2015-11-03 DIAGNOSIS — R5382 Chronic fatigue, unspecified: Secondary | ICD-10-CM | POA: Diagnosis not present

## 2015-11-03 DIAGNOSIS — I255 Ischemic cardiomyopathy: Secondary | ICD-10-CM | POA: Diagnosis not present

## 2015-11-03 DIAGNOSIS — I130 Hypertensive heart and chronic kidney disease with heart failure and stage 1 through stage 4 chronic kidney disease, or unspecified chronic kidney disease: Secondary | ICD-10-CM

## 2015-11-03 DIAGNOSIS — N400 Enlarged prostate without lower urinary tract symptoms: Secondary | ICD-10-CM | POA: Insufficient documentation

## 2015-11-03 DIAGNOSIS — I252 Old myocardial infarction: Secondary | ICD-10-CM

## 2015-11-03 DIAGNOSIS — Z800 Family history of malignant neoplasm of digestive organs: Secondary | ICD-10-CM | POA: Insufficient documentation

## 2015-11-03 DIAGNOSIS — I2510 Atherosclerotic heart disease of native coronary artery without angina pectoris: Secondary | ICD-10-CM | POA: Diagnosis not present

## 2015-11-03 DIAGNOSIS — Z87891 Personal history of nicotine dependence: Secondary | ICD-10-CM | POA: Insufficient documentation

## 2015-11-03 DIAGNOSIS — Z801 Family history of malignant neoplasm of trachea, bronchus and lung: Secondary | ICD-10-CM | POA: Insufficient documentation

## 2015-11-03 DIAGNOSIS — R161 Splenomegaly, not elsewhere classified: Secondary | ICD-10-CM

## 2015-11-03 DIAGNOSIS — Z79899 Other long term (current) drug therapy: Secondary | ICD-10-CM

## 2015-11-03 DIAGNOSIS — K219 Gastro-esophageal reflux disease without esophagitis: Secondary | ICD-10-CM | POA: Insufficient documentation

## 2015-11-03 DIAGNOSIS — D801 Nonfamilial hypogammaglobulinemia: Secondary | ICD-10-CM | POA: Diagnosis not present

## 2015-11-03 DIAGNOSIS — Z95810 Presence of automatic (implantable) cardiac defibrillator: Secondary | ICD-10-CM | POA: Insufficient documentation

## 2015-11-03 LAB — CBC WITH DIFFERENTIAL/PLATELET
BASOS ABS: 0 10*3/uL (ref 0–0.1)
BASOS PCT: 0 %
Eosinophils Absolute: 0 10*3/uL (ref 0–0.7)
Eosinophils Relative: 0 %
HEMATOCRIT: 35.8 % — AB (ref 40.0–52.0)
HEMOGLOBIN: 11.9 g/dL — AB (ref 13.0–18.0)
Lymphocytes Relative: 30 %
Lymphs Abs: 1.2 10*3/uL (ref 1.0–3.6)
MCH: 28.7 pg (ref 26.0–34.0)
MCHC: 33.1 g/dL (ref 32.0–36.0)
MCV: 86.8 fL (ref 80.0–100.0)
Monocytes Absolute: 0.5 10*3/uL (ref 0.2–1.0)
Monocytes Relative: 14 %
NEUTROS ABS: 2.1 10*3/uL (ref 1.4–6.5)
NEUTROS PCT: 56 %
Platelets: 102 10*3/uL — ABNORMAL LOW (ref 150–440)
RBC: 4.13 MIL/uL — AB (ref 4.40–5.90)
RDW: 14.1 % (ref 11.5–14.5)
WBC: 3.8 10*3/uL (ref 3.8–10.6)

## 2015-11-03 LAB — BASIC METABOLIC PANEL
ANION GAP: 5 (ref 5–15)
BUN: 19 mg/dL (ref 6–20)
CHLORIDE: 103 mmol/L (ref 101–111)
CO2: 30 mmol/L (ref 22–32)
CREATININE: 0.85 mg/dL (ref 0.61–1.24)
Calcium: 8.8 mg/dL — ABNORMAL LOW (ref 8.9–10.3)
GFR calc non Af Amer: >60 mL/min (ref >60)
Glucose, Bld: 112 mg/dL — ABNORMAL HIGH (ref 65–99)
POTASSIUM: 4.1 mmol/L (ref 3.5–5.1)
SODIUM: 138 mmol/L (ref 135–145)

## 2015-11-03 LAB — IRON AND TIBC
IRON: 34 ug/dL — AB (ref 45–182)
SATURATION RATIOS: 10 % — AB (ref 17.9–39.5)
TIBC: 329 ug/dL (ref 250–450)
UIBC: 295 ug/dL

## 2015-11-03 LAB — FERRITIN: Ferritin: 131 ng/mL (ref 24–336)

## 2015-11-03 LAB — LACTATE DEHYDROGENASE: LDH: 172 U/L (ref 98–192)

## 2015-11-03 NOTE — Progress Notes (Signed)
[place] NOTE  Patient Care Team: Sharyne Peach, MD as PCP - General (Family Medicine) Shela Nevin, MD as Attending Physician (Cardiology)  CHIEF COMPLAINTS/PURPOSE OF CONSULTATION:   # 2014- THROMBOCYTOPENIA [130s-140s] sec to splenomegaly; Feb- 2017- 107/N-Hb [hep B/C/HIV- Neg]; US-moderate splenomegaly. March 2017- BMBx- Megakaryocytosis/ otherwise no obvious dyspoiesis/ or malignancy; no increase in reticulin fibrosis; cytogenetics- pending.    # 2017- ? Mod Splenomegaly   # Hypogammaglobinemia/Asymptomatic- incidental   HISTORY OF PRESENTING ILLNESS:  Gary Ferrell 80 y.o.  male Caucasian patient is here to  Follow up for mild thrombocytopenia.  Patient continues to deny any unusual bleeding. No weight loss. Denies any night sweats or fevers. He denies any recent infections. No weight loss. Mild chronic fatigue not any worse.  ROS: A complete 10 point review of system is done which is negative except mentioned above in history of present illness  MEDICAL HISTORY:  Past Medical History:  Diagnosis Date  . AICD (automatic cardioverter/defibrillator) present   . BPH (benign prostatic hyperplasia)    a. 06/2012 s/p Greenlight Laser Prostatectomy.  . Carotid disease, bilateral ([place])    a. 1990's s/p L CEA;  b. 11/2004 s/p redo L CEA;  c. 06/2013 Carotid U/S: RICA 123456, LICA patent.  . Chronic systolic CHF (congestive heart failure) ([place])    a. 04/2013 Echo: EF 35%, inf DK, inflat AK, diast dysfxn, RV HK, mild MR, nmildly dil LA.  . CKD (chronic kidney disease), stage II   . Coronary artery disease    a. 2007 s/p MI & CABG.  . Diverticulosis    a. 10/2013 colonoscopy (otw nl study).  Marland Kitchen GERD (gastroesophageal reflux disease)   . Hyperlipidemia   . Hypertension   . Ischemic cardiomyopathy    a. 04/2013 Echo: EF 35%;  b. 06/2013 s/p SJM Bi-V ICD, ser # [id number].  Marland Kitchen LBBB (left bundle branch block)   . Lung nodules   . Myocardial infarction ([place])    . OSA (obstructive sleep apnea)    a. 12/2013 Sleep study: severe OSA - CPAP-   . PAD (peripheral artery disease) ([place])    a. 1990's s/p Aortobifem bypass.  . Presence of permanent cardiac pacemaker   . Spinal stenosis   . Stricture of urethral meatus    a. 08/2009 s/p dil.  . Stroke [facility])    a. 2007 Periop CABG - no residual.    SURGICAL HISTORY: Past Surgical History:  Procedure Laterality Date  . AORTA - BILATERAL FEMORAL ARTERY BYPASS GRAFT  1992  . APPENDECTOMY  1964  . BI-VENTRICULAR IMPLANTABLE CARDIOVERTER DEFIBRILLATOR N/A 06/25/2013   Procedure: BI-VENTRICULAR IMPLANTABLE CARDIOVERTER DEFIBRILLATOR  (CRT-D);  Surgeon: Evans Lance, MD;  Location[facility] CATH LAB;  Service: Cardiovascular;  Laterality: N/A;  . BI-VENTRICULAR IMPLANTABLE CARDIOVERTER DEFIBRILLATOR  (CRT-D)  06/25/2013  . BI-VENTRICULAR IMPLANTABLE CARDIOVERTER DEFIBRILLATOR  (CRT-D)  06/25/13   STJ CRTD implanted by Dr Lovena Le  . CARDIAC CATHETERIZATION  2007; 2012   "unsuccessful attempt to put stents in in 2007, had MI, had to have CABG;"  . CAROTID ENDARTERECTOMY Left 1992; 2006  . CHOLECYSTECTOMY  ~ 1996  . COLON SURGERY  2007   herniated intestinal obstruction  . CORONARY ARTERY BYPASS GRAFT  2007   "CABG X3" (06/25/2013)  . GREEN LIGHT LASER TURP (TRANSURETHRAL RESECTION OF PROSTATE N/A 07/02/2012   Procedure: GREEN LIGHT LASER TURP (TRANSURETHRAL RESECTION OF PROSTATE;  Surgeon: Ailene Rud, MD;  Location: [facility] ORS;  Service:  Urology;  Laterality: N/A;  . SKIN GRAFT  2008   "area of colon surgery wouldn't heal; had this graft over area"  . TONSILLECTOMY    . URETHRAL MEATOPLASTY  08/2009  . URETHRAL STRICTURE DILATATION  02/2010; 09/2010  . VENTRAL HERNIA REPAIR  2008   ventral epigastric hernia repair [Other]    SOCIAL HISTORY:  Patient lives in  [place];   Quit smoking 1992;  No alcohol. He lives alone.  Retired Medical illustrator. Social History   Social History  . Marital status: Married     Spouse name: N/A  . Number of children: 1  . Years of education: N/A   Occupational History  . Retired    Social History Main Topics  . Smoking status: Former Smoker    Packs/day: 1.00    Years: 20.00    Types: Cigarettes    Quit date: 04/11/1989  . Smokeless tobacco: Never Used  . Alcohol use No  . Drug use: No  . Sexual activity: No   Other Topics Concern  . Not on file   Social History Narrative  . No narrative on file    FAMILY HISTORY: Family History  Problem Relation Age of Onset  . Pancreatic cancer Father   . Diabetes Brother   . Heart disease Brother   . Colon cancer Maternal Grandmother   . Parkinson's disease Mother   . Lung cancer Brother     stage 4    ALLERGIES:  is allergic to dapsone and gluten meal.  MEDICATIONS:  Current Outpatient Prescriptions  Medication Sig Dispense Refill  . aspirin 325 MG tablet Take 325 mg by mouth daily.    . carvedilol (COREG) 3.125 MG tablet TAKE 1 TABLET BY MOUTH 2 (TWO) TIMES DAILY WITH MEALS. 180 tablet 1  . cholecalciferol (VITAMIN D) 1000 UNITS tablet Take 1,000 Units by mouth daily.    . enalapril (VASOTEC) 5 MG tablet Take 2.5 mg by mouth every morning.     . methylcellulose (ARTIFICIAL TEARS) 1 % ophthalmic solution Place 1-2 drops into both eyes daily as needed (dry eyes).    . Multiple Vitamin (MULTIVITAMIN WITH MINERALS) TABS Take 1 tablet by mouth daily.    . Omega-3 Fatty Acids (FISH OIL PO) Take 1 capsule by mouth daily.    . rosuvastatin (CRESTOR) 20 MG tablet Take 20 mg by mouth daily. Reported on 06/16/2015    . white petrolatum (VASELINE) GEL Apply 1 application topically 2 (two) times daily. Applied to legs     No current facility-administered medications for this visit.       Marland Kitchen  PHYSICAL EXAMINATION: ECOG PERFORMANCE STATUS: 0 - Asymptomatic  There were no vitals filed for this visit. There were no vitals filed for this visit.   GENERAL: Well-nourished well-developed; Alert, no distress and  comfortable.  Alone EYES: no pallor or icterus OROPHARYNX: no thrush or ulceration; good dentition  NECK: supple, no masses felt LYMPH:  no palpable lymphadenopathy in the cervical, axillary or inguinal regions LUNGS: clear to auscultation and  No wheeze or crackles HEART/CVS: regular rate & rhythm and no murmurs; No lower extremity edema ABDOMEN: abdomen soft, non-tender and normal bowel sounds; positive for splenomegaly [4 fingers breath below costal margin] Musculoskeletal:no cyanosis of digits and no clubbing  PSYCH: alert & oriented x 3 with fluent speech NEURO: no focal motor/sensory deficits SKIN:  no rashes or significant lesions    LABORATORY DATA:  I have reviewed the data as listed Lab Results  Component Value Date   WBC 3.8 11/03/2015   HGB 11.9 (L) 11/03/2015   HCT 35.8 (L) 11/03/2015   MCV 86.8 11/03/2015   PLT 102 (L) 11/03/2015    Recent Labs  11/03/15 1012  NA 138  K 4.1  CL 103  CO2 30  GLUCOSE 112*  BUN 19  CREATININE 0.85  CALCIUM 8.8*  GFRNONAA >60  GFRAA >60    RADIOGRAPHIC STUDIES: I have personally reviewed the radiological images as listed and agreed with the findings in the report. No results found.  ASSESSMENT & PLAN:   Thrombocytopenia ([place]) #  Mild chronic thrombocytopenia/asymptomatic -patient's platelets today are 104; stable likely secondary to splenic sequestration. Stable not any worse. Monitor for now.  # Hemoglobin 11.9- no etiology check iron studies; stool occult card. Colonoscopy 2015 normal.  If low would recommend by mouth iron.  # Follow-up CBC/iron studies in 3 months  # Follow with me with labs in 6 months.       Cammie Sickle, MD 11/03/2015 4:02 PM

## 2015-11-03 NOTE — Assessment & Plan Note (Addendum)
#    Mild chronic thrombocytopenia/asymptomatic -patient's platelets today are 104; stable likely secondary to splenic sequestration. Stable not any worse. Monitor for now.  # Hemoglobin 11.9- no etiology check iron studies; stool occult card. Colonoscopy 2015 normal.  If low would recommend by mouth iron.  # Follow-up CBC/iron studies in 3 months  # Follow with me with labs in 6 months.

## 2015-11-04 DIAGNOSIS — D696 Thrombocytopenia, unspecified: Secondary | ICD-10-CM | POA: Diagnosis not present

## 2015-11-05 DIAGNOSIS — D696 Thrombocytopenia, unspecified: Secondary | ICD-10-CM | POA: Diagnosis not present

## 2015-11-06 ENCOUNTER — Ambulatory Visit: Payer: Medicare Other

## 2015-11-06 LAB — OCCULT BLOOD X 1 CARD TO LAB, STOOL
FECAL OCCULT BLD: NEGATIVE
FECAL OCCULT BLD: NEGATIVE

## 2015-12-16 ENCOUNTER — Encounter: Payer: Self-pay | Admitting: *Deleted

## 2015-12-16 ENCOUNTER — Ambulatory Visit
Admission: EM | Admit: 2015-12-16 | Discharge: 2015-12-16 | Disposition: A | Discharge status: Home / Self Care | Payer: Medicare Other | Attending: Family Medicine | Admitting: Family Medicine

## 2015-12-16 DIAGNOSIS — Z79899 Other long term (current) drug therapy: Secondary | ICD-10-CM | POA: Diagnosis not present

## 2015-12-16 DIAGNOSIS — Z7982 Long term (current) use of aspirin: Secondary | ICD-10-CM | POA: Diagnosis not present

## 2015-12-16 DIAGNOSIS — Z87891 Personal history of nicotine dependence: Secondary | ICD-10-CM | POA: Insufficient documentation

## 2015-12-16 DIAGNOSIS — I2510 Atherosclerotic heart disease of native coronary artery without angina pectoris: Secondary | ICD-10-CM | POA: Insufficient documentation

## 2015-12-16 DIAGNOSIS — W1781XA Fall down embankment (hill), initial encounter: Secondary | ICD-10-CM | POA: Insufficient documentation

## 2015-12-16 DIAGNOSIS — I5022 Chronic systolic (congestive) heart failure: Secondary | ICD-10-CM | POA: Insufficient documentation

## 2015-12-16 DIAGNOSIS — G4733 Obstructive sleep apnea (adult) (pediatric): Secondary | ICD-10-CM | POA: Insufficient documentation

## 2015-12-16 DIAGNOSIS — I255 Ischemic cardiomyopathy: Secondary | ICD-10-CM | POA: Diagnosis not present

## 2015-12-16 DIAGNOSIS — S80812A Abrasion, left lower leg, initial encounter: Secondary | ICD-10-CM | POA: Diagnosis present

## 2015-12-16 DIAGNOSIS — Z95810 Presence of automatic (implantable) cardiac defibrillator: Secondary | ICD-10-CM | POA: Diagnosis not present

## 2015-12-16 DIAGNOSIS — Z8673 Personal history of transient ischemic attack (TIA), and cerebral infarction without residual deficits: Secondary | ICD-10-CM | POA: Insufficient documentation

## 2015-12-16 DIAGNOSIS — I252 Old myocardial infarction: Secondary | ICD-10-CM | POA: Diagnosis not present

## 2015-12-16 DIAGNOSIS — E785 Hyperlipidemia, unspecified: Secondary | ICD-10-CM | POA: Diagnosis not present

## 2015-12-16 DIAGNOSIS — I130 Hypertensive heart and chronic kidney disease with heart failure and stage 1 through stage 4 chronic kidney disease, or unspecified chronic kidney disease: Secondary | ICD-10-CM | POA: Insufficient documentation

## 2015-12-16 DIAGNOSIS — N400 Enlarged prostate without lower urinary tract symptoms: Secondary | ICD-10-CM | POA: Diagnosis not present

## 2015-12-16 DIAGNOSIS — Z8679 Personal history of other diseases of the circulatory system: Secondary | ICD-10-CM

## 2015-12-16 MED ORDER — TETANUS-DIPHTH-ACELL PERTUSSIS 5-2.5-18.5 LF-MCG/0.5 IM SUSP
0.5000 mL | Freq: Once | INTRAMUSCULAR | Status: AC
  Administered 2015-12-16: 0.5 mL via INTRAMUSCULAR

## 2015-12-16 MED ORDER — MUPIROCIN 2 % EX OINT: 1.0000 "application " | TOPICAL_OINTMENT | Freq: Three times a day (TID) | CUTANEOUS | 0 refills | Status: DC

## 2015-12-16 NOTE — ED Triage Notes (Signed)
Pt states he slipped and fell. He denies syncope.

## 2015-12-16 NOTE — ED Provider Notes (Signed)
[facility]    CSN: [id number] Arrival date & time: 12/16/15  1139  First Provider Contact:  First MD Initiated Contact with Patient 12/16/15 1251        History   Chief Complaint Chief Complaint  Patient presents with  . Abrasion    HPI Gary Ferrell is a 80 y.o. male.   Patient's here because of falling. He states his home yesterday there is some outside work where he slipped on right incline and fell receiving several abrasions to his left leg. He was worried about infection exists that he was worried became red. He is not the best historian but he is unable to say when or tell us when his last tetanus current. While he insist that he just slipped on initial presentation to nurse thought that his pulse it was very regular. He also has a history of heart arrhythmia. Or I cannot tell us what exactly happened he was given a defibrillator indicating was a severe cardiac arrhythmia that was found. He is on multiple medications for coronary artery disease hypertension hyperlipidemia and ischemic cardiomyopathy. He also has BPH. He is looks to dapsone and gluten meals and is a former smoker. Past or previous family history is not pertinent to today's visit. She will be noted that he denies any dizziness or lightheadedness   The history is provided by the patient. No language interpreter was used.  Fall  This is a new problem. The current episode started yesterday. The problem has not changed since onset.Pertinent negatives include no shortness of breath. Nothing aggravates the symptoms. Nothing relieves the symptoms. He has tried nothing for the symptoms. The treatment provided no relief.    Past Medical History:  Diagnosis Date  . AICD (automatic cardioverter/defibrillator) present   . BPH (benign prostatic hyperplasia)    a. 06/2012 s/p Greenlight Laser Prostatectomy.  . Carotid disease, bilateral ([place])    a. 1990's s/p L CEA;  b. 11/2004 s/p redo L CEA;  c. 06/2013 Carotid  U/S: RICA 123456, LICA patent.  . Chronic systolic CHF (congestive heart failure) ([place])    a. 04/2013 Echo: EF 35%, inf DK, inflat AK, diast dysfxn, RV HK, mild MR, nmildly dil LA.  . CKD (chronic kidney disease), stage II   . Coronary artery disease    a. 2007 s/p MI & CABG.  . Diverticulosis    a. 10/2013 colonoscopy (otw nl study).  Marland Kitchen GERD (gastroesophageal reflux disease)   . Hyperlipidemia   . Hypertension   . Ischemic cardiomyopathy    a. 04/2013 Echo: EF 35%;  b. 06/2013 s/p SJM Bi-V ICD, ser # [id number].  Marland Kitchen LBBB (left bundle branch block)   . Lung nodules   . Myocardial infarction ([place])   . OSA (obstructive sleep apnea)    a. 12/2013 Sleep study: severe OSA - CPAP-   . PAD (peripheral artery disease) ([place])    a. 1990's s/p Aortobifem bypass.  . Presence of permanent cardiac pacemaker   . Spinal stenosis   . Stricture of urethral meatus    a. 08/2009 s/p dil.  . Stroke [facility])    a. 2007 Periop CABG - no residual.    Patient Active Problem List   Diagnosis Date Noted  . Thrombocytopenia ([place]) 11/03/2015  . Splenomegaly 11/03/2015  . Preop cardiovascular exam 05/20/2014  . PAD (peripheral artery disease) ([place])   . Carotid disease, bilateral ([place])   . Ischemic cardiomyopathy   . Chronic systolic CHF (congestive heart failure) (  [place])   . Coronary artery disease   . Hyperlipidemia   . Hypertension   . Personal history of colonic polyps 10/14/2013  . ICD (implantable cardioverter-defibrillator), biventricular, in situ 10/01/2013  . Obstructive sleep apnea 10/01/2013  . Occlusion and stenosis of carotid artery without mention of cerebral infarction 07/02/2013  . Cardiomyopathy, ischemic 06/11/2013  . Chronic systolic heart failure ([place]) 06/11/2013    Past Surgical History:  Procedure Laterality Date  . AORTA - BILATERAL FEMORAL ARTERY BYPASS GRAFT  1992  . APPENDECTOMY  1964  . BI-VENTRICULAR IMPLANTABLE CARDIOVERTER DEFIBRILLATOR N/A 06/25/2013   Procedure: BI-VENTRICULAR  IMPLANTABLE CARDIOVERTER DEFIBRILLATOR  (CRT-D);  Surgeon: Evans Lance, MD;  Location: [facility] CATH LAB;  Service: Cardiovascular;  Laterality: N/A;  . BI-VENTRICULAR IMPLANTABLE CARDIOVERTER DEFIBRILLATOR  (CRT-D)  06/25/2013  . BI-VENTRICULAR IMPLANTABLE CARDIOVERTER DEFIBRILLATOR  (CRT-D)  06/25/13   STJ CRTD implanted by Dr Lovena Le  . CARDIAC CATHETERIZATION  2007; 2012   "unsuccessful attempt to put stents in in 2007, had MI, had to have CABG;"  . CAROTID ENDARTERECTOMY Left 1992; 2006  . CHOLECYSTECTOMY  ~ 1996  . COLON SURGERY  2007   herniated intestinal obstruction  . CORONARY ARTERY BYPASS GRAFT  2007   "CABG X3" (06/25/2013)  . GREEN LIGHT LASER TURP (TRANSURETHRAL RESECTION OF PROSTATE N/A 07/02/2012   Procedure: GREEN LIGHT LASER TURP (TRANSURETHRAL RESECTION OF PROSTATE;  Surgeon: Ailene Rud, MD;  Location: [facility] ORS;  Service: Urology;  Laterality: N/A;  . SKIN GRAFT  2008   "area of colon surgery wouldn't heal; had this graft over area"  . TONSILLECTOMY    . URETHRAL MEATOPLASTY  08/2009  . URETHRAL STRICTURE DILATATION  02/2010; 09/2010  . VENTRAL HERNIA REPAIR  2008   ventral epigastric hernia repair [Other]       Home Medications    Prior to Admission medications   Medication Sig Start Date End Date Taking? Authorizing Provider  aspirin 325 MG tablet Take 325 mg by mouth daily.   Yes Historical Provider, MD  carvedilol (COREG) 3.125 MG tablet TAKE 1 TABLET BY MOUTH 2 (TWO) TIMES DAILY WITH MEALS. 08/29/14  Yes Evans Lance, MD  cholecalciferol (VITAMIN D) 1000 UNITS tablet Take 1,000 Units by mouth daily.   Yes Historical Provider, MD  enalapril (VASOTEC) 5 MG tablet Take 2.5 mg by mouth every morning.  05/06/13  Yes Historical Provider, MD  Multiple Vitamin (MULTIVITAMIN WITH MINERALS) TABS Take 1 tablet by mouth daily.   Yes Historical Provider, MD  rosuvastatin (CRESTOR) 20 MG tablet Take 20 mg by mouth daily. Reported on 06/16/2015   Yes Historical Provider, MD    methylcellulose (ARTIFICIAL TEARS) 1 % ophthalmic solution Place 1-2 drops into both eyes daily as needed (dry eyes).    Historical Provider, MD  mupirocin ointment (BACTROBAN) 2 % Apply 1 application topically 3 (three) times daily. 12/16/15   Frederich Cha, MD  Omega-3 Fatty Acids (FISH OIL PO) Take 1 capsule by mouth daily.    Historical Provider, MD  white petrolatum (VASELINE) GEL Apply 1 application topically 2 (two) times daily. Applied to legs    Historical Provider, MD    Family History Family History  Problem Relation Age of Onset  . Pancreatic cancer Father   . Diabetes Brother   . Heart disease Brother   . Colon cancer Maternal Grandmother   . Parkinson's disease Mother   . Lung cancer Brother     stage 4    Social History Social  History  Substance Use Topics  . Smoking status: Former Smoker    Packs/day: 1.00    Years: 20.00    Types: Cigarettes    Quit date: 04/11/1989  . Smokeless tobacco: Never Used  . Alcohol use No     Allergies   Dapsone and Gluten meal   Review of Systems Review of Systems  Constitutional: Negative for diaphoresis.  Respiratory: Negative for chest tightness, shortness of breath and wheezing.   Musculoskeletal: Positive for myalgias.  Neurological: Positive for dizziness, seizures, syncope, speech difficulty and weakness.  All other systems reviewed and are negative.    Physical Exam Triage Vital Signs ED Triage Vitals  Enc Vitals Group     BP 12/16/15 1150 (!) 132/58     Pulse Rate 12/16/15 1150 68     Resp 12/16/15 1150 16     Temp 12/16/15 1150 98 F (36.7 C)     Temp Source 12/16/15 1150 Oral     SpO2 12/16/15 1150 100 %     Weight 12/16/15 1156 160 lb (72.6 kg)     Height 12/16/15 1156 6' (1.829 m)     Head Circumference --      Peak Flow --      Pain Score --      Pain Loc --      Pain Edu? --      Excl. in [place]? --    No data found.   Updated Vital Signs BP (!) 132/58 (BP Location: Left Arm)   Pulse 68   Temp  98 F (36.7 C) (Oral)   Resp 16   Ht 6' (1.829 m)   Wt 160 lb (72.6 kg)   SpO2 100%   BMI 21.70 kg/m   Visual Acuity Right Eye Distance:   Left Eye Distance:   Bilateral Distance:    Right Eye Near:   Left Eye Near:    Bilateral Near:     Physical Exam  Constitutional: He appears well-developed and well-nourished.  HENT:  Head: Normocephalic and atraumatic.  Eyes: Pupils are equal, round, and reactive to light.  Neck: Neck supple.  Cardiovascular: Normal rate, regular rhythm and normal heart sounds.   Pulmonary/Chest: Effort normal and breath sounds normal.  Musculoskeletal: He exhibits tenderness. He exhibits no edema or deformity.       Left knee: He exhibits laceration. Tenderness found.       Legs: Patient with 26 areas of abrasion on his left leg there are shallow but hyperemic. No repair is indicated at this time.  Neurological: He is alert.  Skin: Skin is warm and dry. No rash noted. He is not diaphoretic. No erythema.  Psychiatric: He has a normal mood and affect.  Vitals reviewed.    UC Treatments / Results  Labs (all labs ordered are listed, but only abnormal results are displayed) Labs Reviewed - No data to display  EKG  EKG Interpretation None     ED ECG REPORT I, Owens Hara H, the attending physician, personally viewed and interpreted this ECG.   Date: 12/16/2015  EKG Time:12:10:36  Rate: 64  Rhythm: there are no previous tracings available for comparison, LBBB, 1st degree AV block  Axis: 58  Intervals:left bundle branch block  ST&T Change: none  Radiology No results found.  Procedures Procedures (including critical care time)  Medications Ordered in UC Medications  Tdap (BOOSTRIX) injection 0.5 mL (0.5 mLs Intramuscular Given 12/16/15 1253)     Initial Impression / Assessment and Plan /  UC Course  I have reviewed the triage vital signs and the nursing notes.  Pertinent labs & imaging results that were available during my care of  the patient were reviewed by me and considered in my medical decision making (see chart for details).  Clinical Course    Patient will be placed on Bactroban ointment to use to 3 times a day. Will be given a tetanus injection for immunization purposes patients also unable to give [place] a history is when his last tetanus was. Patient's is also poor historian as mentioned above far as his cardiac history is concerned must assume that he's had a history of arrhythmia he does have a defibrillator implanted since there is no signs of irregularity on the EKG in the nurse did think that he heard marked irregular heartbeat when he first presented he shows a sinus rhythm now. EKG is abnormal but was again passed to be expected. He denies any syncopal episode or nursing. Insist he just slipped when he went down. I'll see gets worse or if he has recurrent falls please see PCP of his choice  Final Clinical Impressions(s) / UC Diagnoses   Final diagnoses:  Abrasion of left leg, initial encounter  Fall down embankment, initial encounter  H/O cardiac arrhythmia    New Prescriptions New Prescriptions   MUPIROCIN OINTMENT (BACTROBAN) 2 %    Apply 1 application topically 3 (three) times daily.     Frederich Cha, MD 12/16/15 1321

## 2015-12-16 NOTE — ED Triage Notes (Signed)
Pt fell while mowing yesterday. C/o 2 abrasions to left shin. Sites appear red.

## 2016-01-05 ENCOUNTER — Ambulatory Visit: Payer: Medicare Other | Admitting: Family

## 2016-01-05 ENCOUNTER — Encounter (HOSPITAL_COMMUNITY): Payer: Medicare Other

## 2016-02-02 ENCOUNTER — Inpatient Hospital Stay: Payer: Medicare Other | Attending: Internal Medicine

## 2016-05-03 ENCOUNTER — Inpatient Hospital Stay: Payer: Medicare Other | Admitting: Internal Medicine

## 2016-05-03 ENCOUNTER — Inpatient Hospital Stay: Payer: Medicare Other

## 2016-05-10 ENCOUNTER — Inpatient Hospital Stay: Payer: Medicare Other | Attending: Internal Medicine

## 2016-05-10 ENCOUNTER — Inpatient Hospital Stay (HOSPITAL_BASED_OUTPATIENT_CLINIC_OR_DEPARTMENT_OTHER): Payer: Medicare Other | Admitting: Internal Medicine

## 2016-05-10 VITALS — BP 127/54 | HR 64 | Temp 97.6°F | Wt 156.2 lb

## 2016-05-10 DIAGNOSIS — Z800 Family history of malignant neoplasm of digestive organs: Secondary | ICD-10-CM

## 2016-05-10 DIAGNOSIS — Z87891 Personal history of nicotine dependence: Secondary | ICD-10-CM

## 2016-05-10 DIAGNOSIS — I252 Old myocardial infarction: Secondary | ICD-10-CM | POA: Diagnosis not present

## 2016-05-10 DIAGNOSIS — K219 Gastro-esophageal reflux disease without esophagitis: Secondary | ICD-10-CM

## 2016-05-10 DIAGNOSIS — E785 Hyperlipidemia, unspecified: Secondary | ICD-10-CM | POA: Insufficient documentation

## 2016-05-10 DIAGNOSIS — R161 Splenomegaly, not elsewhere classified: Secondary | ICD-10-CM

## 2016-05-10 DIAGNOSIS — N182 Chronic kidney disease, stage 2 (mild): Secondary | ICD-10-CM

## 2016-05-10 DIAGNOSIS — N400 Enlarged prostate without lower urinary tract symptoms: Secondary | ICD-10-CM | POA: Insufficient documentation

## 2016-05-10 DIAGNOSIS — Z950 Presence of cardiac pacemaker: Secondary | ICD-10-CM | POA: Diagnosis not present

## 2016-05-10 DIAGNOSIS — I2510 Atherosclerotic heart disease of native coronary artery without angina pectoris: Secondary | ICD-10-CM | POA: Insufficient documentation

## 2016-05-10 DIAGNOSIS — R630 Anorexia: Secondary | ICD-10-CM | POA: Diagnosis not present

## 2016-05-10 DIAGNOSIS — Z79899 Other long term (current) drug therapy: Secondary | ICD-10-CM

## 2016-05-10 DIAGNOSIS — I130 Hypertensive heart and chronic kidney disease with heart failure and stage 1 through stage 4 chronic kidney disease, or unspecified chronic kidney disease: Secondary | ICD-10-CM

## 2016-05-10 DIAGNOSIS — R634 Abnormal weight loss: Secondary | ICD-10-CM | POA: Diagnosis not present

## 2016-05-10 DIAGNOSIS — Z8673 Personal history of transient ischemic attack (TIA), and cerebral infarction without residual deficits: Secondary | ICD-10-CM

## 2016-05-10 DIAGNOSIS — Z7982 Long term (current) use of aspirin: Secondary | ICD-10-CM | POA: Insufficient documentation

## 2016-05-10 DIAGNOSIS — D696 Thrombocytopenia, unspecified: Secondary | ICD-10-CM

## 2016-05-10 DIAGNOSIS — G4733 Obstructive sleep apnea (adult) (pediatric): Secondary | ICD-10-CM

## 2016-05-10 DIAGNOSIS — D801 Nonfamilial hypogammaglobulinemia: Secondary | ICD-10-CM | POA: Insufficient documentation

## 2016-05-10 DIAGNOSIS — Z801 Family history of malignant neoplasm of trachea, bronchus and lung: Secondary | ICD-10-CM

## 2016-05-10 LAB — CBC WITH DIFFERENTIAL/PLATELET
Basophils Absolute: 0 10*3/uL (ref 0–0.1)
Basophils Relative: 0 %
EOS ABS: 0 10*3/uL (ref 0–0.7)
Eosinophils Relative: 0 %
HCT: 36 % — ABNORMAL LOW (ref 40.0–52.0)
Hemoglobin: 11.7 g/dL — ABNORMAL LOW (ref 13.0–18.0)
LYMPHS ABS: 1.4 10*3/uL (ref 1.0–3.6)
Lymphocytes Relative: 35 %
MCH: 27.3 pg (ref 26.0–34.0)
MCHC: 32.5 g/dL (ref 32.0–36.0)
MCV: 83.9 fL (ref 80.0–100.0)
MONO ABS: 0.6 10*3/uL (ref 0.2–1.0)
MONOS PCT: 14 %
Neutro Abs: 2 10*3/uL (ref 1.4–6.5)
Neutrophils Relative %: 51 %
PLATELETS: 98 10*3/uL — AB (ref 150–440)
RBC: 4.29 MIL/uL — ABNORMAL LOW (ref 4.40–5.90)
RDW: 16.5 % — AB (ref 11.5–14.5)
WBC: 3.9 10*3/uL (ref 3.8–10.6)

## 2016-05-10 LAB — IRON AND TIBC
IRON: 41 ug/dL — AB (ref 45–182)
Saturation Ratios: 11 % — ABNORMAL LOW (ref 17.9–39.5)
TIBC: 364 ug/dL (ref 250–450)
UIBC: 323 ug/dL

## 2016-05-10 LAB — FERRITIN: Ferritin: 413 ng/mL — ABNORMAL HIGH (ref 24–336)

## 2016-05-10 NOTE — Progress Notes (Signed)
Patient here today for follow up.  Patient has some concerns about memory loss, as well as his son

## 2016-05-10 NOTE — Progress Notes (Signed)
[facility] NOTE  Patient Care Team: Sharyne Peach, MD as PCP - General (Family Medicine) Shela Nevin, MD as Attending Physician (Cardiology)  CHIEF COMPLAINTS/PURPOSE OF CONSULTATION:   # 2014- THROMBOCYTOPENIA [130s-140s] sec to splenomegaly; Feb- 2017- 107/N-Hb [hep B/C/HIV- Neg]; US-moderate splenomegaly. March 2017- BMBx- Megakaryocytosis/ otherwise no obvious dyspoiesis/ or malignancy; no increase in reticulin fibrosis; cytogenetics- pending.    # 2017- ? Mod Splenomegaly   # Hypogammaglobinemia/Asymptomatic- incidental   HISTORY OF PRESENTING ILLNESS:  Gary Ferrell 81 y.o.  male Caucasian patient is here to  Follow up for mild thrombocytopenia.  Patient is accompanied by by his son- complaints of memory problems ever since his wife died approximately year and half ago.  Poor appetite. Lost weight. Denies any unusual bleeding. No weight loss. Denies any night sweats or fevers. He denies any recent infections. No weight loss. Mild chronic fatigue not any worse.  ROS: A complete 10 point review of system is done which is negative except mentioned above in history of present illness  MEDICAL HISTORY:  Past Medical History:  Diagnosis Date  . AICD (automatic cardioverter/defibrillator) present   . BPH (benign prostatic hyperplasia)    a. 06/2012 s/p Greenlight Laser Prostatectomy.  . Carotid disease, bilateral ([place])    a. 1990's s/p L CEA;  b. 11/2004 s/p redo L CEA;  c. 06/2013 Carotid U/S: RICA 123456, LICA patent.  . Chronic systolic CHF (congestive heart failure) ([place])    a. 04/2013 Echo: EF 35%, inf DK, inflat AK, diast dysfxn, RV HK, mild MR, nmildly dil LA.  . CKD (chronic kidney disease), stage II   . Coronary artery disease    a. 2007 s/p MI & CABG.  . Diverticulosis    a. 10/2013 colonoscopy (otw nl study).  Marland Kitchen GERD (gastroesophageal reflux disease)   . Hyperlipidemia   . Hypertension   . Ischemic cardiomyopathy    a. 04/2013 Echo:  EF 35%;  b. 06/2013 s/p SJM Bi-V ICD, ser # [id number].  Marland Kitchen LBBB (left bundle branch block)   . Lung nodules   . Myocardial infarction   . OSA (obstructive sleep apnea)    a. 12/2013 Sleep study: severe OSA - CPAP-   . PAD (peripheral artery disease) ([place])    a. 1990's s/p Aortobifem bypass.  . Presence of permanent cardiac pacemaker   . Spinal stenosis   . Stricture of urethral meatus    a. 08/2009 s/p dil.  . Stroke [facility])    a. 2007 Periop CABG - no residual.    SURGICAL HISTORY: Past Surgical History:  Procedure Laterality Date  . AORTA - BILATERAL FEMORAL ARTERY BYPASS GRAFT  1992  . APPENDECTOMY  1964  . BI-VENTRICULAR IMPLANTABLE CARDIOVERTER DEFIBRILLATOR N/A 06/25/2013   Procedure: BI-VENTRICULAR IMPLANTABLE CARDIOVERTER DEFIBRILLATOR  (CRT-D);  Surgeon: Evans Lance, MD;  Location: [facility] CATH LAB;  Service: Cardiovascular;  Laterality: N/A;  . BI-VENTRICULAR IMPLANTABLE CARDIOVERTER DEFIBRILLATOR  (CRT-D)  06/25/2013  . BI-VENTRICULAR IMPLANTABLE CARDIOVERTER DEFIBRILLATOR  (CRT-D)  06/25/13   STJ CRTD implanted by Dr Lovena Le  . CARDIAC CATHETERIZATION  2007; 2012   "unsuccessful attempt to put stents in in 2007, had MI, had to have CABG;"  . CAROTID ENDARTERECTOMY Left 1992; 2006  . CHOLECYSTECTOMY  ~ 1996  . COLON SURGERY  2007   herniated intestinal obstruction  . CORONARY ARTERY BYPASS GRAFT  2007   "CABG X3" (06/25/2013)  . GREEN LIGHT LASER TURP (TRANSURETHRAL RESECTION OF PROSTATE N/A 07/02/2012  Procedure: GREEN LIGHT LASER TURP (TRANSURETHRAL RESECTION OF PROSTATE;  Surgeon: Ailene Rud, MD;  Location: [facility]S;  Service: Urology;  Laterality: N/A;  . SKIN GRAFT  2008   "area of colon surgery wouldn't heal; had this graft over area"  . TONSILLECTOMY    . URETHRAL MEATOPLASTY  08/2009  . URETHRAL STRICTURE DILATATION  02/2010; 09/2010  . VENTRAL HERNIA REPAIR  2008   ventral epigastric hernia repair [Other]    SOCIAL HISTORY:  Patient lives in  [place];   Quit  smoking 1992;  No alcohol. He lives alone.  Retired Medical illustrator. Social History   Social History  . Marital status: Married    Spouse name: N/A  . Number of children: 1  . Years of education: N/A   Occupational History  . Retired    Social History Main Topics  . Smoking status: Former Smoker    Packs/day: 1.00    Years: 20.00    Types: Cigarettes    Quit date: 04/11/1989  . Smokeless tobacco: Never Used  . Alcohol use No  . Drug use: No  . Sexual activity: No   Other Topics Concern  . Not on file   Social History Narrative  . No narrative on file    FAMILY HISTORY: Family History  Problem Relation Age of Onset  . Pancreatic cancer Father   . Diabetes Brother   . Heart disease Brother   . Colon cancer Maternal Grandmother   . Parkinson's disease Mother   . Lung cancer Brother     stage 4    ALLERGIES:  is allergic to dapsone and gluten meal.  MEDICATIONS:  Current Outpatient Prescriptions  Medication Sig Dispense Refill  . aspirin 325 MG tablet Take 325 mg by mouth daily.    . carvedilol (COREG) 3.125 MG tablet TAKE 1 TABLET BY MOUTH 2 (TWO) TIMES DAILY WITH MEALS. 180 tablet 1  . cholecalciferol (VITAMIN D) 1000 UNITS tablet Take 1,000 Units by mouth daily.    . enalapril (VASOTEC) 5 MG tablet Take 2.5 mg by mouth every morning.     . methylcellulose (ARTIFICIAL TEARS) 1 % ophthalmic solution Place 1-2 drops into both eyes daily as needed (dry eyes).    . Multiple Vitamin (MULTIVITAMIN WITH MINERALS) TABS Take 1 tablet by mouth daily.    . Omega-3 Fatty Acids (FISH OIL PO) Take 1 capsule by mouth daily.    . rosuvastatin (CRESTOR) 20 MG tablet Take 20 mg by mouth daily. Reported on 06/16/2015    . white petrolatum (VASELINE) GEL Apply 1 application topically 2 (two) times daily. Applied to legs     No current facility-administered medications for this visit.       Marland Kitchen  PHYSICAL EXAMINATION: ECOG PERFORMANCE STATUS: 0 - Asymptomatic  Vitals:   05/10/16  1144  BP: (!) 127/54  Pulse: 64  Temp: 97.6 F (36.4 C)   Filed Weights   05/10/16 1144  Weight: 156 lb 3.1 oz (70.8 kg)     GENERAL: Well-nourished well-developed; Alert, no distress and comfortable.  Accompanied by his son. EYES: no pallor or icterus OROPHARYNX: no thrush or ulceration; good dentition  NECK: supple, no masses felt LYMPH:  no palpable lymphadenopathy in the cervical, axillary or inguinal regions LUNGS: clear to auscultation and  No wheeze or crackles HEART/CVS: regular rate & rhythm and no murmurs; No lower extremity edema ABDOMEN: abdomen soft, non-tender and normal bowel sounds; positive for splenomegaly [4 fingers breath below costal margin] Musculoskeletal:no  cyanosis of digits and no clubbing  PSYCH: alert & oriented x 3 with fluent speech NEURO: no focal motor/sensory deficits SKIN:  no rashes or significant lesions    LABORATORY DATA:  I have reviewed the data as listed Lab Results  Component Value Date   WBC 3.9 05/10/2016   HGB 11.7 (L) 05/10/2016   HCT 36.0 (L) 05/10/2016   MCV 83.9 05/10/2016   PLT 98 (L) 05/10/2016    Recent Labs  11/03/15 1012  NA 138  K 4.1  CL 103  CO2 30  GLUCOSE 112*  BUN 19  CREATININE 0.85  CALCIUM 8.8*  GFRNONAA >60  GFRAA >60    RADIOGRAPHIC STUDIES: I have personally reviewed the radiological images as listed and agreed with the findings in the report. No results found.  ASSESSMENT & PLAN:   Thrombocytopenia ([place]) #  Mild chronic thrombocytopenia/asymptomatic -patient's platelets today are 98/slow trend down over 2-3 years likely secondary to splenic sequestration. Hemoglobin slightly low at 11.7. Colonoscopy 2015 normal.   # Splenomegaly unclear etiology- splenic lymphoma versus others. Await the ultrasound of the abdomen  # memory issues- the etiology of his memory problems unclear. Defer to her PCP.  # Weight loss: Memory problems vs splenomegaly. Check ultrasound of the abdomen.  # follow  up in 4 weeks or so- to review the results of the abdomen.   # [id number] jon [son]     Cammie Sickle, MD 05/10/2016 3:24 PM

## 2016-05-10 NOTE — Assessment & Plan Note (Addendum)
#    Mild chronic thrombocytopenia/asymptomatic -patient's platelets today are 98/slow trend down over 2-3 years likely secondary to splenic sequestration. Hemoglobin slightly low at 11.7. Colonoscopy 2015 normal.   # Splenomegaly unclear etiology- splenic lymphoma versus others. Await the ultrasound of the abdomen  # memory issues- the etiology of his memory problems unclear. Defer to her PCP.  # Weight loss: Memory problems vs splenomegaly. Check ultrasound of the abdomen.  # follow up in 4 weeks or so- to review the results of the abdomen.   # [id number]

## 2016-05-17 ENCOUNTER — Ambulatory Visit
Admission: RE | Admit: 2016-05-17 | Discharge: 2016-05-17 | Disposition: A | Discharge status: Home / Self Care | Payer: Medicare Other | Source: Ambulatory Visit | Attending: Internal Medicine | Admitting: Internal Medicine

## 2016-05-17 DIAGNOSIS — K760 Fatty (change of) liver, not elsewhere classified: Secondary | ICD-10-CM | POA: Diagnosis not present

## 2016-05-17 DIAGNOSIS — D696 Thrombocytopenia, unspecified: Secondary | ICD-10-CM | POA: Diagnosis present

## 2016-05-17 DIAGNOSIS — R161 Splenomegaly, not elsewhere classified: Secondary | ICD-10-CM | POA: Diagnosis present

## 2016-05-24 ENCOUNTER — Inpatient Hospital Stay: Payer: Medicare Other | Attending: Internal Medicine | Admitting: Internal Medicine

## 2016-05-24 VITALS — BP 116/36 | HR 59 | Temp 97.3°F | Resp 20 | Wt 154.7 lb

## 2016-05-24 DIAGNOSIS — I739 Peripheral vascular disease, unspecified: Secondary | ICD-10-CM | POA: Diagnosis not present

## 2016-05-24 DIAGNOSIS — D801 Nonfamilial hypogammaglobulinemia: Secondary | ICD-10-CM | POA: Insufficient documentation

## 2016-05-24 DIAGNOSIS — I2510 Atherosclerotic heart disease of native coronary artery without angina pectoris: Secondary | ICD-10-CM | POA: Diagnosis not present

## 2016-05-24 DIAGNOSIS — I509 Heart failure, unspecified: Secondary | ICD-10-CM | POA: Insufficient documentation

## 2016-05-24 DIAGNOSIS — R630 Anorexia: Secondary | ICD-10-CM

## 2016-05-24 DIAGNOSIS — Z87891 Personal history of nicotine dependence: Secondary | ICD-10-CM | POA: Insufficient documentation

## 2016-05-24 DIAGNOSIS — G4733 Obstructive sleep apnea (adult) (pediatric): Secondary | ICD-10-CM | POA: Insufficient documentation

## 2016-05-24 DIAGNOSIS — E785 Hyperlipidemia, unspecified: Secondary | ICD-10-CM | POA: Insufficient documentation

## 2016-05-24 DIAGNOSIS — R161 Splenomegaly, not elsewhere classified: Secondary | ICD-10-CM | POA: Diagnosis not present

## 2016-05-24 DIAGNOSIS — R413 Other amnesia: Secondary | ICD-10-CM | POA: Diagnosis not present

## 2016-05-24 DIAGNOSIS — Z950 Presence of cardiac pacemaker: Secondary | ICD-10-CM | POA: Diagnosis not present

## 2016-05-24 DIAGNOSIS — D696 Thrombocytopenia, unspecified: Secondary | ICD-10-CM

## 2016-05-24 DIAGNOSIS — K760 Fatty (change of) liver, not elsewhere classified: Secondary | ICD-10-CM | POA: Diagnosis not present

## 2016-05-24 DIAGNOSIS — I959 Hypotension, unspecified: Secondary | ICD-10-CM | POA: Diagnosis not present

## 2016-05-24 DIAGNOSIS — Z8673 Personal history of transient ischemic attack (TIA), and cerebral infarction without residual deficits: Secondary | ICD-10-CM | POA: Diagnosis not present

## 2016-05-24 DIAGNOSIS — R634 Abnormal weight loss: Secondary | ICD-10-CM | POA: Diagnosis not present

## 2016-05-24 DIAGNOSIS — N400 Enlarged prostate without lower urinary tract symptoms: Secondary | ICD-10-CM | POA: Diagnosis not present

## 2016-05-24 DIAGNOSIS — I252 Old myocardial infarction: Secondary | ICD-10-CM | POA: Insufficient documentation

## 2016-05-24 DIAGNOSIS — I130 Hypertensive heart and chronic kidney disease with heart failure and stage 1 through stage 4 chronic kidney disease, or unspecified chronic kidney disease: Secondary | ICD-10-CM | POA: Diagnosis not present

## 2016-05-24 DIAGNOSIS — Z7982 Long term (current) use of aspirin: Secondary | ICD-10-CM | POA: Insufficient documentation

## 2016-05-24 DIAGNOSIS — C8307 Small cell B-cell lymphoma, spleen: Secondary | ICD-10-CM | POA: Diagnosis not present

## 2016-05-24 DIAGNOSIS — R42 Dizziness and giddiness: Secondary | ICD-10-CM | POA: Diagnosis not present

## 2016-05-24 DIAGNOSIS — K219 Gastro-esophageal reflux disease without esophagitis: Secondary | ICD-10-CM | POA: Diagnosis not present

## 2016-05-24 DIAGNOSIS — Z800 Family history of malignant neoplasm of digestive organs: Secondary | ICD-10-CM | POA: Insufficient documentation

## 2016-05-24 DIAGNOSIS — Z79899 Other long term (current) drug therapy: Secondary | ICD-10-CM | POA: Insufficient documentation

## 2016-05-24 DIAGNOSIS — K7460 Unspecified cirrhosis of liver: Secondary | ICD-10-CM | POA: Diagnosis not present

## 2016-05-24 DIAGNOSIS — Z801 Family history of malignant neoplasm of trachea, bronchus and lung: Secondary | ICD-10-CM | POA: Insufficient documentation

## 2016-05-24 NOTE — Assessment & Plan Note (Addendum)
#    Mild chronic thrombocytopenia/asymptomatic -patient's platelets today are 98/slow trend down over 2-3 years likely secondary to splenic sequestration. Hemoglobin slightly low at 11.7. Colonoscopy 2015 normal. [place]- worsening splenomegaly [930 cc to 1300 cc]  # Splenomegaly- ? Etiology. Concerns for splenic marginal zone lymphoma. MRI of Recommend PET scan for further evaluation.   # memory issues- the etiology of his memory problems unclear. Defer to her PCP; awaiting neurology evaluation.   # Weight loss: Memory problems vs splenomegaly. Await above work up.   # Low blood pressure- diastolic in [id number]; discontinue ace inhibitor. Discussed with Dr. Iona Beard.   # follow up in 2 weeks or so after the PET scan.   # [id number] [son]. Discussed my above plan with Dr.George.

## 2016-05-24 NOTE — Progress Notes (Signed)
[facility] NOTE  Patient Care Team: Sharyne Peach, MD as PCP - General (Family Medicine) Shela Nevin, MD as Attending Physician (Cardiology)  CHIEF COMPLAINTS/PURPOSE OF CONSULTATION:   # 2014- THROMBOCYTOPENIA [130s-140s] sec to splenomegaly; Feb- 2017- 107/N-Hb [hep B/C/HIV- Neg]; US-moderate splenomegaly. March 2017- BMBx- Megakaryocytosis/ otherwise no obvious dyspoiesis/ or malignancy; no increase in reticulin fibrosis; cytogenetics- pending.    # 2017- ? Mod Splenomegaly   # Hypogammaglobinemia/Asymptomatic- incidental   HISTORY OF PRESENTING ILLNESS:  Gary Ferrell 81 y.o.  male Caucasian patient is here to  Follow up for mild thrombocytopenia/ splenomegaly is here for follow-up.  Poor appetite. Lost weight. Denies any unusual bleeding. No weight loss. Denies any night sweats or fevers. Patient continues to have memory problems. He complains of dizziness- with position changes.   ROS: A complete 10 point review of system is done which is negative except mentioned above in history of present illness  MEDICAL HISTORY:  Past Medical History:  Diagnosis Date  . AICD (automatic cardioverter/defibrillator) present   . BPH (benign prostatic hyperplasia)    a. 06/2012 s/p Greenlight Laser Prostatectomy.  . Carotid disease, bilateral ([place])    a. 1990's s/p L CEA;  b. 11/2004 s/p redo L CEA;  c. 06/2013 Carotid U/S: RICA 123456, LICA patent.  . Chronic systolic CHF (congestive heart failure) ([place])    a. 04/2013 Echo: EF 35%, inf DK, inflat AK, diast dysfxn, RV HK, mild MR, nmildly dil LA.  . CKD (chronic kidney disease), stage II   . Coronary artery disease    a. 2007 s/p MI & CABG.  . Diverticulosis    a. 10/2013 colonoscopy (otw nl study).  Marland Kitchen GERD (gastroesophageal reflux disease)   . Hyperlipidemia   . Hypertension   . Ischemic cardiomyopathy    a. 04/2013 Echo: EF 35%;  b. 06/2013 s/p SJM Bi-V ICD, ser # [id number].  Marland Kitchen LBBB (left bundle branch  block)   . Lung nodules   . Myocardial infarction   . OSA (obstructive sleep apnea)    a. 12/2013 Sleep study: severe OSA - CPAP-   . PAD (peripheral artery disease) ([place])    a. 1990's s/p Aortobifem bypass.  . Presence of permanent cardiac pacemaker   . Spinal stenosis   . Stricture of urethral meatus    a. 08/2009 s/p dil.  . Stroke [facility])    a. 2007 Periop CABG - no residual.    SURGICAL HISTORY: Past Surgical History:  Procedure Laterality Date  . AORTA - BILATERAL FEMORAL ARTERY BYPASS GRAFT  1992  . APPENDECTOMY  1964  . BI-VENTRICULAR IMPLANTABLE CARDIOVERTER DEFIBRILLATOR N/A 06/25/2013   Procedure: BI-VENTRICULAR IMPLANTABLE CARDIOVERTER DEFIBRILLATOR  (CRT-D);  Surgeon: Evans Lance, MD;  Location: [facility] CATH LAB;  Service: Cardiovascular;  Laterality: N/A;  . BI-VENTRICULAR IMPLANTABLE CARDIOVERTER DEFIBRILLATOR  (CRT-D)  06/25/2013  . BI-VENTRICULAR IMPLANTABLE CARDIOVERTER DEFIBRILLATOR  (CRT-D)  06/25/13   STJ CRTD implanted by Dr Lovena Le  . CARDIAC CATHETERIZATION  2007; 2012   "unsuccessful attempt to put stents in in 2007, had MI, had to have CABG;"  . CAROTID ENDARTERECTOMY Left 1992; 2006  . CHOLECYSTECTOMY  ~ 1996  . COLON SURGERY  2007   herniated intestinal obstruction  . CORONARY ARTERY BYPASS GRAFT  2007   "CABG X3" (06/25/2013)  . GREEN LIGHT LASER TURP (TRANSURETHRAL RESECTION OF PROSTATE N/A 07/02/2012   Procedure: GREEN LIGHT LASER TURP (TRANSURETHRAL RESECTION OF PROSTATE;  Surgeon: Ailene Rud, MD;  Location: [facility] ORS;  Service: Urology;  Laterality: N/A;  . SKIN GRAFT  2008   "area of colon surgery wouldn't heal; had this graft over area"  . TONSILLECTOMY    . URETHRAL MEATOPLASTY  08/2009  . URETHRAL STRICTURE DILATATION  02/2010; 09/2010  . VENTRAL HERNIA REPAIR  2008   ventral epigastric hernia repair [Other]    SOCIAL HISTORY:  Patient lives in  [place];   Quit smoking 1992;  No alcohol. He lives alone.  Retired Medical illustrator. Social  History   Social History  . Marital status: Married    Spouse name: N/A  . Number of children: 1  . Years of education: N/A   Occupational History  . Retired    Social History Main Topics  . Smoking status: Former Smoker    Packs/day: 1.00    Years: 20.00    Types: Cigarettes    Quit date: 04/11/1989  . Smokeless tobacco: Never Used  . Alcohol use No  . Drug use: No  . Sexual activity: No   Other Topics Concern  . Not on file   Social History Narrative  . No narrative on file    FAMILY HISTORY: Family History  Problem Relation Age of Onset  . Pancreatic cancer Father   . Diabetes Brother   . Heart disease Brother   . Colon cancer Maternal Grandmother   . Parkinson's disease Mother   . Lung cancer Brother     stage 4    ALLERGIES:  is allergic to dapsone and gluten meal.  MEDICATIONS:  Current Outpatient Prescriptions  Medication Sig Dispense Refill  . aspirin 325 MG tablet Take 325 mg by mouth daily.    . carvedilol (COREG) 3.125 MG tablet TAKE 1 TABLET BY MOUTH 2 (TWO) TIMES DAILY WITH MEALS. 180 tablet 1  . cholecalciferol (VITAMIN D) 1000 UNITS tablet Take 1,000 Units by mouth daily.    . enalapril (VASOTEC) 5 MG tablet Take 2.5 mg by mouth every morning.     . methylcellulose (ARTIFICIAL TEARS) 1 % ophthalmic solution Place 1-2 drops into both eyes daily as needed (dry eyes).    . Multiple Vitamin (MULTIVITAMIN WITH MINERALS) TABS Take 1 tablet by mouth daily.    . Omega-3 Fatty Acids (FISH OIL PO) Take 1 capsule by mouth daily.    . rosuvastatin (CRESTOR) 20 MG tablet Take 20 mg by mouth daily. Reported on 06/16/2015    . white petrolatum (VASELINE) GEL Apply 1 application topically 2 (two) times daily. Applied to legs     No current facility-administered medications for this visit.       Marland Kitchen  PHYSICAL EXAMINATION: ECOG PERFORMANCE STATUS: 0 - Asymptomatic  Vitals:   05/24/16 1001  BP: (!) 116/36  Pulse: (!) 59  Resp: 20  Temp: 97.3 F (36.3 C)    Filed Weights   05/24/16 1001  Weight: 154 lb 10.4 oz (70.1 kg)     GENERAL: Well-nourished well-developed; Alert, no distress and comfortable.  Accompanied by his son. EYES: no pallor or icterus OROPHARYNX: no thrush or ulceration; good dentition  NECK: supple, no masses felt LYMPH:  no palpable lymphadenopathy in the cervical, axillary or inguinal regions LUNGS: clear to auscultation and  No wheeze or crackles HEART/CVS: regular rate & rhythm and no murmurs; No lower extremity edema ABDOMEN: abdomen soft, non-tender and normal bowel sounds; positive for splenomegaly [4 fingers breath below costal margin] Musculoskeletal:no cyanosis of digits and no clubbing  PSYCH: alert & oriented x  3 with fluent speech NEURO: no focal motor/sensory deficits SKIN:  no rashes or significant lesions    LABORATORY DATA:  I have reviewed the data as listed Lab Results  Component Value Date   WBC 3.9 05/10/2016   HGB 11.7 (L) 05/10/2016   HCT 36.0 (L) 05/10/2016   MCV 83.9 05/10/2016   PLT 98 (L) 05/10/2016    Recent Labs  11/03/15 1012  NA 138  K 4.1  CL 103  CO2 30  GLUCOSE 112*  BUN 19  CREATININE 0.85  CALCIUM 8.8*  GFRNONAA >60  GFRAA >60    RADIOGRAPHIC STUDIES: I have personally reviewed the radiological images as listed and agreed with the findings in the report. US Abdomen Complete  Result Date: 05/17/2016 CLINICAL DATA:  History of splenomegaly and cirrhosis. EXAM: ABDOMEN ULTRASOUND COMPLETE COMPARISON:  Abdominal ultrasound 06/17/2015. FINDINGS: Gallbladder: Removed. Common bile duct: Diameter: 0.3 cm Liver: No focal lesion or intrahepatic biliary ductal dilatation. Echogenicity is increased. IVC: No abnormality visualized. Pancreas: Visualized portion unremarkable. Spleen: No focal lesion. Splenic volume is 1306 cc compared to 936 cc on the prior ultrasound. Right Kidney: Length: 11.0 cm. Echogenicity within normal limits. No mass or hydronephrosis visualized. Left  Kidney: Length: 12.0 cm. Echogenicity within normal limits. No mass or hydronephrosis visualized. Abdominal aorta: No aneurysm visualized. Other findings: None. IMPRESSION: Worsened splenomegaly as described above. Fatty infiltration of the liver. Electronically Signed   By: Inge Rise M.D.   On: 05/17/2016 10:59    ASSESSMENT & PLAN:   Thrombocytopenia ([place]) #  Mild chronic thrombocytopenia/asymptomatic -patient's platelets today are 98/slow trend down over 2-3 years likely secondary to splenic sequestration. Hemoglobin slightly low at 11.7. Colonoscopy 2015 normal. [place]- worsening splenomegaly [930 cc to 1300 cc]  # Splenomegaly- ? Etiology. Concerns for splenic marginal zone lymphoma. MRI of Recommend PET scan for further evaluation.   # memory issues- the etiology of his memory problems unclear. Defer to her PCP; awaiting neurology evaluation.   # Weight loss: Memory problems vs splenomegaly. Await above work up.   # Low blood pressure- diastolic in [id number]; discontinue ace inhibitor. Discussed with Dr. Iona Beard.   # follow up in 2 weeks or so after the PET scan.   # [id number] [son]. Discussed my above plan with Dr.George.      Cammie Sickle, MD 05/24/2016 12:37 PM

## 2016-05-24 NOTE — Progress Notes (Signed)
Patient here today for follow up.  Pt's son concerned about on going loss of memory.

## 2016-05-31 ENCOUNTER — Ambulatory Visit
Admission: RE | Admit: 2016-05-31 | Discharge: 2016-05-31 | Disposition: A | Discharge status: Home / Self Care | Payer: Medicare Other | Source: Ambulatory Visit | Attending: Internal Medicine | Admitting: Internal Medicine

## 2016-05-31 ENCOUNTER — Inpatient Hospital Stay (HOSPITAL_BASED_OUTPATIENT_CLINIC_OR_DEPARTMENT_OTHER): Payer: Medicare Other | Admitting: Internal Medicine

## 2016-05-31 VITALS — BP 109/58 | HR 71 | Temp 98.2°F | Wt 155.0 lb

## 2016-05-31 DIAGNOSIS — I2510 Atherosclerotic heart disease of native coronary artery without angina pectoris: Secondary | ICD-10-CM

## 2016-05-31 DIAGNOSIS — I739 Peripheral vascular disease, unspecified: Secondary | ICD-10-CM | POA: Diagnosis not present

## 2016-05-31 DIAGNOSIS — R634 Abnormal weight loss: Secondary | ICD-10-CM | POA: Diagnosis not present

## 2016-05-31 DIAGNOSIS — I252 Old myocardial infarction: Secondary | ICD-10-CM

## 2016-05-31 DIAGNOSIS — C8307 Small cell B-cell lymphoma, spleen: Secondary | ICD-10-CM

## 2016-05-31 DIAGNOSIS — Z87891 Personal history of nicotine dependence: Secondary | ICD-10-CM

## 2016-05-31 DIAGNOSIS — I130 Hypertensive heart and chronic kidney disease with heart failure and stage 1 through stage 4 chronic kidney disease, or unspecified chronic kidney disease: Secondary | ICD-10-CM | POA: Diagnosis not present

## 2016-05-31 DIAGNOSIS — Z950 Presence of cardiac pacemaker: Secondary | ICD-10-CM

## 2016-05-31 DIAGNOSIS — Z800 Family history of malignant neoplasm of digestive organs: Secondary | ICD-10-CM

## 2016-05-31 DIAGNOSIS — Z8673 Personal history of transient ischemic attack (TIA), and cerebral infarction without residual deficits: Secondary | ICD-10-CM

## 2016-05-31 DIAGNOSIS — K7460 Unspecified cirrhosis of liver: Secondary | ICD-10-CM

## 2016-05-31 DIAGNOSIS — N400 Enlarged prostate without lower urinary tract symptoms: Secondary | ICD-10-CM

## 2016-05-31 DIAGNOSIS — Z79899 Other long term (current) drug therapy: Secondary | ICD-10-CM

## 2016-05-31 DIAGNOSIS — Z9049 Acquired absence of other specified parts of digestive tract: Secondary | ICD-10-CM | POA: Diagnosis not present

## 2016-05-31 DIAGNOSIS — I509 Heart failure, unspecified: Secondary | ICD-10-CM | POA: Diagnosis not present

## 2016-05-31 DIAGNOSIS — Z7982 Long term (current) use of aspirin: Secondary | ICD-10-CM

## 2016-05-31 DIAGNOSIS — K219 Gastro-esophageal reflux disease without esophagitis: Secondary | ICD-10-CM

## 2016-05-31 DIAGNOSIS — D696 Thrombocytopenia, unspecified: Secondary | ICD-10-CM | POA: Diagnosis not present

## 2016-05-31 DIAGNOSIS — R630 Anorexia: Secondary | ICD-10-CM | POA: Diagnosis not present

## 2016-05-31 DIAGNOSIS — G4733 Obstructive sleep apnea (adult) (pediatric): Secondary | ICD-10-CM

## 2016-05-31 DIAGNOSIS — R590 Localized enlarged lymph nodes: Secondary | ICD-10-CM | POA: Insufficient documentation

## 2016-05-31 DIAGNOSIS — E785 Hyperlipidemia, unspecified: Secondary | ICD-10-CM | POA: Diagnosis not present

## 2016-05-31 DIAGNOSIS — Z801 Family history of malignant neoplasm of trachea, bronchus and lung: Secondary | ICD-10-CM

## 2016-05-31 DIAGNOSIS — R161 Splenomegaly, not elsewhere classified: Secondary | ICD-10-CM | POA: Insufficient documentation

## 2016-05-31 DIAGNOSIS — I700 Atherosclerosis of aorta: Secondary | ICD-10-CM | POA: Diagnosis not present

## 2016-05-31 DIAGNOSIS — D801 Nonfamilial hypogammaglobulinemia: Secondary | ICD-10-CM | POA: Diagnosis not present

## 2016-05-31 DIAGNOSIS — K760 Fatty (change of) liver, not elsewhere classified: Secondary | ICD-10-CM

## 2016-05-31 DIAGNOSIS — R413 Other amnesia: Secondary | ICD-10-CM

## 2016-05-31 LAB — GLUCOSE, CAPILLARY: GLUCOSE-CAPILLARY: 119 mg/dL — AB (ref 65–99)

## 2016-05-31 MED ORDER — FLUDEOXYGLUCOSE F - 18 (FDG) INJECTION
12.9000 | Freq: Once | INTRAVENOUS | Status: AC | PRN
  Administered 2016-05-31: 12.9 via INTRAVENOUS

## 2016-05-31 NOTE — Assessment & Plan Note (Addendum)
#    Mild chronic thrombocytopenia/asymptomatic -patient's platelets today are 98/slow trend down over 2-3 years likely secondary to splenic sequestration. Hemoglobin slightly low at 11.7. Colonoscopy 2015 normal. [place]- worsening splenomegaly [930 cc to 1300 cc]. PET scan shows- subcentimeter lymphadenopathy noted in the left neck; mediastinal adenopathy approximately centimeter in size and 1.2 cm lymph node in the left underarm [easily palpable]. Also mild adenopathy noted in the porta hepatis; and enlarged spleen with mild uptake. All concerning for possible low-grade lymphoma.  # Discussed with the patient's son that a biopsy is recommended for further evaluation. We will review the imaging at the tumor conference;   # memory issues- the etiology of his memory problems unclear. awaiting neurology evaluation next week.   # Weight loss: Memory problems vs splenomegaly. Await above work up- Next week.  # Low blood pressure- diastolic in [id number]; discontinued ace inhibitor. Today diastolic is 58.  # [id number] [son].  Continue to follow up in 3 weeks.   # I reviewed the blood work- with the patient in detail; also reviewed the imaging independently [as summarized above]; and with the patient in detail.

## 2016-05-31 NOTE — Progress Notes (Signed)
Patient here today for follow up.  Patient concerned about right hand discoloration, yellow in color

## 2016-05-31 NOTE — Progress Notes (Signed)
[facility] NOTE  Patient Care Team: Sharyne Peach, MD as PCP - General (Family Medicine) Shela Nevin, MD as Attending Physician (Cardiology)  CHIEF COMPLAINTS/PURPOSE OF CONSULTATION:   # 2014- THROMBOCYTOPENIA [130s-140s] sec to splenomegaly; Feb- 2017- 107/N-Hb [hep B/C/HIV- Neg]; US-moderate splenomegaly. March 2017- BMBx- Megakaryocytosis/ otherwise no obvious dyspoiesis/ or malignancy; no increase in reticulin fibrosis; cytogenetics-n.    # 2017- ? Mod Splenomegaly   # Hypogammaglobinemia/Asymptomatic- incidental   HISTORY OF PRESENTING ILLNESS:  Gary Ferrell 81 y.o.  male Caucasian patient is here to  Follow up for mild thrombocytopenia/ splenomegaly is here for follow-up- review the results of the PET scan that was in this morning.  Poor appetite. Lost weight. Denies any unusual bleeding. No weight loss. Denies any night sweats or fevers. Patient continues to have memory problem; awaiting neurology evaluation next week.  ROS: A complete 10 point review of system is done which is negative except mentioned above in history of present illness  MEDICAL HISTORY:  Past Medical History:  Diagnosis Date  . AICD (automatic cardioverter/defibrillator) present   . BPH (benign prostatic hyperplasia)    a. 06/2012 s/p Greenlight Laser Prostatectomy.  . Carotid disease, bilateral ([place])    a. 1990's s/p L CEA;  b. 11/2004 s/p redo L CEA;  c. 06/2013 Carotid U/S: RICA 123456, LICA patent.  . Chronic systolic CHF (congestive heart failure) ([place])    a. 04/2013 Echo: EF 35%, inf DK, inflat AK, diast dysfxn, RV HK, mild MR, nmildly dil LA.  . CKD (chronic kidney disease), stage II   . Coronary artery disease    a. 2007 s/p MI & CABG.  . Diverticulosis    a. 10/2013 colonoscopy (otw nl study).  Marland Kitchen GERD (gastroesophageal reflux disease)   . Hyperlipidemia   . Hypertension   . Ischemic cardiomyopathy    a. 04/2013 Echo: EF 35%;  b. 06/2013 s/p SJM Bi-V ICD, ser #  [id number].  Marland Kitchen LBBB (left bundle branch block)   . Lung nodules   . Myocardial infarction   . OSA (obstructive sleep apnea)    a. 12/2013 Sleep study: severe OSA - CPAP-   . PAD (peripheral artery disease) ([place])    a. 1990's s/p Aortobifem bypass.  . Presence of permanent cardiac pacemaker   . Spinal stenosis   . Stricture of urethral meatus    a. 08/2009 s/p dil.  . Stroke [facility])    a. 2007 Periop CABG - no residual.    SURGICAL HISTORY: Past Surgical History:  Procedure Laterality Date  . AORTA - BILATERAL FEMORAL ARTERY BYPASS GRAFT  1992  . APPENDECTOMY  1964  . BI-VENTRICULAR IMPLANTABLE CARDIOVERTER DEFIBRILLATOR N/A 06/25/2013   Procedure: BI-VENTRICULAR IMPLANTABLE CARDIOVERTER DEFIBRILLATOR  (CRT-D);  Surgeon: Evans Lance, MD;  Location: [facility] CATH LAB;  Service: Cardiovascular;  Laterality: N/A;  . BI-VENTRICULAR IMPLANTABLE CARDIOVERTER DEFIBRILLATOR  (CRT-D)  06/25/2013  . BI-VENTRICULAR IMPLANTABLE CARDIOVERTER DEFIBRILLATOR  (CRT-D)  06/25/13   STJ CRTD implanted by Dr Lovena Le  . CARDIAC CATHETERIZATION  2007; 2012   "unsuccessful attempt to put stents in in 2007, had MI, had to have CABG;"  . CAROTID ENDARTERECTOMY Left 1992; 2006  . CHOLECYSTECTOMY  ~ 1996  . COLON SURGERY  2007   herniated intestinal obstruction  . CORONARY ARTERY BYPASS GRAFT  2007   "CABG X3" (06/25/2013)  . GREEN LIGHT LASER TURP (TRANSURETHRAL RESECTION OF PROSTATE N/A 07/02/2012   Procedure: GREEN LIGHT LASER TURP (TRANSURETHRAL RESECTION OF  PROSTATE;  Surgeon: Ailene Rud, MD;  Location: [facility] ORS;  Service: Urology;  Laterality: N/A;  . SKIN GRAFT  2008   "area of colon surgery wouldn't heal; had this graft over area"  . TONSILLECTOMY    . URETHRAL MEATOPLASTY  08/2009  . URETHRAL STRICTURE DILATATION  02/2010; 09/2010  . VENTRAL HERNIA REPAIR  2008   ventral epigastric hernia repair [Other]    SOCIAL HISTORY:  Patient lives in  [place];   Quit smoking 1992;  No alcohol. He lives alone.   Retired Medical illustrator. Social History   Social History  . Marital status: Married    Spouse name: N/A  . Number of children: 1  . Years of education: N/A   Occupational History  . Retired    Social History Main Topics  . Smoking status: Former Smoker    Packs/day: 1.00    Years: 20.00    Types: Cigarettes    Quit date: 04/11/1989  . Smokeless tobacco: Never Used  . Alcohol use No  . Drug use: No  . Sexual activity: No   Other Topics Concern  . Not on file   Social History Narrative  . No narrative on file    FAMILY HISTORY: Family History  Problem Relation Age of Onset  . Pancreatic cancer Father   . Diabetes Brother   . Heart disease Brother   . Colon cancer Maternal Grandmother   . Parkinson's disease Mother   . Lung cancer Brother     stage 4    ALLERGIES:  is allergic to dapsone and gluten meal.  MEDICATIONS:  Current Outpatient Prescriptions  Medication Sig Dispense Refill  . aspirin 325 MG tablet Take 325 mg by mouth daily.    . carvedilol (COREG) 3.125 MG tablet TAKE 1 TABLET BY MOUTH 2 (TWO) TIMES DAILY WITH MEALS. 180 tablet 1  . cholecalciferol (VITAMIN D) 1000 UNITS tablet Take 1,000 Units by mouth daily.    . methylcellulose (ARTIFICIAL TEARS) 1 % ophthalmic solution Place 1-2 drops into both eyes daily as needed (dry eyes).    . Multiple Vitamin (MULTIVITAMIN WITH MINERALS) TABS Take 1 tablet by mouth daily.    . Omega-3 Fatty Acids (FISH OIL PO) Take 1 capsule by mouth daily.    . rosuvastatin (CRESTOR) 20 MG tablet Take 20 mg by mouth daily. Reported on 06/16/2015    . white petrolatum (VASELINE) GEL Apply 1 application topically 2 (two) times daily. Applied to legs    . enalapril (VASOTEC) 5 MG tablet Take 2.5 mg by mouth every morning.      No current facility-administered medications for this visit.       Marland Kitchen  PHYSICAL EXAMINATION: ECOG PERFORMANCE STATUS: 0 - Asymptomatic  Vitals:   05/31/16 1424  BP: (!) 109/58  Pulse: 71  Temp:  98.2 F (36.8 C)   Filed Weights   05/31/16 1424  Weight: 154 lb 15.7 oz (70.3 kg)     GENERAL: Well-nourished well-developed; Alert, no distress and comfortable.  Accompanied by his son. EYES: no pallor or icterus OROPHARYNX: no thrush or ulceration; good dentition  NECK: supple, no masses felt LYMPH:  no palpable lymphadenopathy in the cervical,or inguinal regions. 1.2 cm lymph node felt in the left underarm. LUNGS: clear to auscultation and  No wheeze or crackles HEART/CVS: regular rate & rhythm and no murmurs; No lower extremity edema ABDOMEN: abdomen soft, non-tender and normal bowel sounds; positive for splenomegaly [4 fingers breath below costal margin] Musculoskeletal:no cyanosis  of digits and no clubbing  PSYCH: alert & oriented x 3 with fluent speech NEURO: no focal motor/sensory deficits SKIN:  no rashes or significant lesions    LABORATORY DATA:  I have reviewed the data as listed Lab Results  Component Value Date   WBC 3.9 05/10/2016   HGB 11.7 (L) 05/10/2016   HCT 36.0 (L) 05/10/2016   MCV 83.9 05/10/2016   PLT 98 (L) 05/10/2016    Recent Labs  11/03/15 1012  NA 138  K 4.1  CL 103  CO2 30  GLUCOSE 112*  BUN 19  CREATININE 0.85  CALCIUM 8.8*  GFRNONAA >60  GFRAA >60    RADIOGRAPHIC STUDIES: I have personally reviewed the radiological images as listed and agreed with the findings in the report. US Abdomen Complete  Result Date: 05/17/2016 CLINICAL DATA:  History of splenomegaly and cirrhosis. EXAM: ABDOMEN ULTRASOUND COMPLETE COMPARISON:  Abdominal ultrasound 06/17/2015. FINDINGS: Gallbladder: Removed. Common bile duct: Diameter: 0.3 cm Liver: No focal lesion or intrahepatic biliary ductal dilatation. Echogenicity is increased. IVC: No abnormality visualized. Pancreas: Visualized portion unremarkable. Spleen: No focal lesion. Splenic volume is 1306 cc compared to 936 cc on the prior ultrasound. Right Kidney: Length: 11.0 cm. Echogenicity within normal  limits. No mass or hydronephrosis visualized. Left Kidney: Length: 12.0 cm. Echogenicity within normal limits. No mass or hydronephrosis visualized. Abdominal aorta: No aneurysm visualized. Other findings: None. IMPRESSION: Worsened splenomegaly as described above. Fatty infiltration of the liver. Electronically Signed   By: Inge Rise M.D.   On: 05/17/2016 10:59   Nm Pet Image Initial (pi) Skull Base To Thigh  Result Date: 05/31/2016 CLINICAL DATA:  Initial treatment strategy for splenic marginal B-cell lymphoma. EXAM: NUCLEAR MEDICINE PET SKULL BASE TO THIGH TECHNIQUE: 12.9 mCi F-18 FDG was injected intravenously. Full-ring PET imaging was performed from the skull base to thigh after the radiotracer. CT data was obtained and used for attenuation correction and anatomic localization. FASTING BLOOD GLUCOSE:  Value: 119 mg/dl COMPARISON:  None. FINDINGS: NECK Hypermetabolic lymph nodes are seen throughout the left neck. Index left level 2 lymph node measures 8 mm (CT image 37) with an SUV max of 13.1. CT images show no acute findings. CHEST There are hypermetabolic mediastinal, hilar, periesophageal, axillary and juxta diaphragmatic lymph nodes. Index low right paratracheal lymph node measures 1.5 cm (CT image 81) with an SUV max of 11.8. Index left axillary lymph node measures 12 mm with an SUV max of 8.0. No hypermetabolic pulmonary nodules. Atherosclerotic calcification of the arterial vasculature. No pericardial or pleural effusion. Pleuroparenchymal scarring the base of the left hemithorax. A few scattered pulmonary nodules are too small for PET resolution. ABDOMEN/PELVIS Spleen measures approximately 30 cm and is diffusely hypermetabolic, with an SUV max of 6.2. No abnormal hypermetabolism in the liver, adrenal glands or pancreas. Hypermetabolic lymph nodes are seen in the porta hepatis as well as peri vascular retroperitoneum in the abdomen and pelvis. Index porta hepatis lymph node measures  approximately 12 mm (CT image 140) with an SUV max of 8.2. Index left periaortic lymph node measures 11 mm (CT image 176) with an SUV max of 8.9. Index right external iliac lymph node measures 8 mm (CT image 218) with an SUV max of 14.4. Index right inguinal lymph node measures 11 mm (CT image 233) with an SUV max of 11.5. Liver, adrenal glands and kidneys are otherwise unremarkable. Cholecystectomy. Pancreas, stomach and bowel are grossly unremarkable. Atherosclerotic calcification of the arterial vasculature with aorto bi-iliac  repair. SKELETON No abnormal osseous hypermetabolism. Degenerative changes are seen in the spine. IMPRESSION: 1. Borderline enlarged hypermetabolic lymph nodes throughout the neck, chest, abdomen and pelvis, together with hypermetabolic splenomegaly, consistent with the given history of lymphoma. 2.  Aortic atherosclerosis (ICD10-170.0). Electronically Signed   By: Lorin Picket M.D.   On: 05/31/2016 13:16    ASSESSMENT & PLAN:   Splenic marginal zone b-cell lymphoma (HCC) #  Mild chronic thrombocytopenia/asymptomatic -patient's platelets today are 98/slow trend down over 2-3 years likely secondary to splenic sequestration. Hemoglobin slightly low at 11.7. Colonoscopy 2015 normal. [place]- worsening splenomegaly [930 cc to 1300 cc]. PET scan shows- subcentimeter lymphadenopathy noted in the left neck; mediastinal adenopathy approximately centimeter in size and 1.2 cm lymph node in the left underarm [easily palpable]. Also mild adenopathy noted in the porta hepatis; and enlarged spleen with mild uptake. All concerning for possible low-grade lymphoma.  # Discussed with the patient's son that a biopsy is recommended for further evaluation. We will review the imaging at the tumor conference;   # memory issues- the etiology of his memory problems unclear. awaiting neurology evaluation next week.   # Weight loss: Memory problems vs splenomegaly. Await above work up- Next week.  # Low  blood pressure- diastolic in [id number]; discontinued ace inhibitor. Today diastolic is 58.  # [id number] [son].  Continue to follow up in 3 weeks.   # I reviewed the blood work- with the patient in detail; also reviewed the imaging independently [as summarized above]; and with the patient in detail.      Cammie Sickle, MD 05/31/2016 2:53 PM

## 2016-06-03 ENCOUNTER — Telehealth: Payer: Self-pay | Admitting: *Deleted

## 2016-06-03 ENCOUNTER — Other Ambulatory Visit: Payer: Self-pay | Admitting: *Deleted

## 2016-06-03 DIAGNOSIS — C8307 Small cell B-cell lymphoma, spleen: Secondary | ICD-10-CM

## 2016-06-03 NOTE — Telephone Encounter (Signed)
Spoke with pt's son. Agreeable to see Dr. Jamal Collin. Referral entered. msg sent to sch. Team to arrange.

## 2016-06-03 NOTE — Telephone Encounter (Signed)
Per v/o Dr. Maye Hides: [name] - attempted to contact patient and pt's son - Gary Ferrell. Left msg for pt's son to contact our office to discuss his father's care. Unable to leave vm for patient (home nor his cell)-phone just rings for patient.    Pt son returns office phone, rn Will need to discuss patient's care with son. Pt's case was discussed in case conference. A referral needs to be coordinated to Dr. Angie Fava office for an open left axillary lymph node biopsy.

## 2016-06-06 ENCOUNTER — Encounter: Payer: Self-pay | Admitting: *Deleted

## 2016-06-07 ENCOUNTER — Encounter: Payer: Self-pay | Admitting: *Deleted

## 2016-06-13 ENCOUNTER — Encounter: Payer: Self-pay | Admitting: General Surgery

## 2016-06-13 ENCOUNTER — Ambulatory Visit (INDEPENDENT_AMBULATORY_CARE_PROVIDER_SITE_OTHER): Payer: Medicare Other | Admitting: General Surgery

## 2016-06-13 ENCOUNTER — Telehealth: Payer: Self-pay | Admitting: *Deleted

## 2016-06-13 VITALS — BP 132/64 | HR 62 | Resp 12 | Ht 72.0 in | Wt 151.0 lb

## 2016-06-13 DIAGNOSIS — R599 Enlarged lymph nodes, unspecified: Secondary | ICD-10-CM | POA: Diagnosis not present

## 2016-06-13 NOTE — Telephone Encounter (Signed)
Message left for patient's son, [name] to call the office.   We need to inform him of patient's pre-admission testing appointment which is scheduled for 06-14-16 at 2:45 pm.

## 2016-06-13 NOTE — Progress Notes (Signed)
Patient ID: Gary Ferrell, male   DOB: 03/06/1936, 81 y.o.   MRN: [id number]  Chief Complaint  Patient presents with  . Other    Eval lymph node     HPI [name] is a 81 y.o. male is here today for an evaluation of a left axillary lymph node. Patient had a PET scan done at [facility] on 05/31/16. Denies fever, chills, night sweats. Patient's son states he lost about 20 lbs. Patient's son Gary Ferrell is present. Denies family history of lymphoma.  History of thrombocytopenia and splenomegly and has been followed by oncology. Lymph node biopsy requested by oncologist. I have reviewed the history of present illness with the patient.  HPI  Past Medical History:  Diagnosis Date  . AICD (automatic cardioverter/defibrillator) present   . BPH (benign prostatic hyperplasia)    a. 06/2012 s/p Greenlight Laser Prostatectomy.  . Carotid disease, bilateral (HCC)    a. 1990's s/p L CEA;  b. 11/2004 s/p redo L CEA;  c. 06/2013 Carotid U/S: RICA 40-59%, LICA patent.  . Chronic systolic CHF (congestive heart failure) (HCC)    a. 04/2013 Echo: EF 35%, inf DK, inflat AK, diast dysfxn, RV HK, mild MR, nmildly dil LA.  . CKD (chronic kidney disease), stage II   . Coronary artery disease    a. 2007 s/p MI & CABG.  . Diverticulosis    a. 10/2013 colonoscopy (otw nl study).  . GERD (gastroesophageal reflux disease)   . Hyperlipidemia   . Hypertension   . Ischemic cardiomyopathy    a. 04/2013 Echo: EF 35%;  b. 06/2013 s/p SJM Bi-V ICD, ser # [id number].  . LBBB (left bundle branch block)   . Lung nodules   . Myocardial infarction   . OSA (obstructive sleep apnea)    a. 12/2013 Sleep study: severe OSA - CPAP-   . PAD (peripheral artery disease) (HCC)    a. 1990's s/p Aortobifem bypass.  . Presence of permanent cardiac pacemaker   . Spinal stenosis   . Stricture of urethral meatus    a. 08/2009 s/p dil.  . Stroke (HCC)    a. 2007 Periop CABG - no residual.    Past Surgical History:  Procedure Laterality  Date  . AORTA - BILATERAL FEMORAL ARTERY BYPASS GRAFT  1992  . APPENDECTOMY  1964  . BI-VENTRICULAR IMPLANTABLE CARDIOVERTER DEFIBRILLATOR N/A 06/25/2013   Procedure: BI-VENTRICULAR IMPLANTABLE CARDIOVERTER DEFIBRILLATOR  (CRT-D);  Surgeon: Gregg W Taylor, MD;  Location: [facility] CATH LAB;  Service: Cardiovascular;  Laterality: N/A;  . BI-VENTRICULAR IMPLANTABLE CARDIOVERTER DEFIBRILLATOR  (CRT-D)  06/25/2013  . BI-VENTRICULAR IMPLANTABLE CARDIOVERTER DEFIBRILLATOR  (CRT-D)  06/25/13   STJ CRTD implanted by Dr Taylor  . CARDIAC CATHETERIZATION  2007; 2012   "unsuccessful attempt to put stents in in 2007, had MI, had to have CABG;"  . CAROTID ENDARTERECTOMY Left 1992; 2006  . CHOLECYSTECTOMY  ~ 1996  . COLON SURGERY  2007   herniated intestinal obstruction  . CORONARY ARTERY BYPASS GRAFT  2007   "CABG X3" (06/25/2013)  . GREEN LIGHT LASER TURP (TRANSURETHRAL RESECTION OF PROSTATE N/A 07/02/2012   Procedure: GREEN LIGHT LASER TURP (TRANSURETHRAL RESECTION OF PROSTATE;  Surgeon: Sigmund I Tannenbaum, MD;  Location: [facility] ORS;  Service: Urology;  Laterality: N/A;  . SKIN GRAFT  2008   "area of colon surgery wouldn't heal; had this graft over area"  . TONSILLECTOMY    . URETHRAL MEATOPLASTY  08/2009  . URETHRAL STRICTURE DILATATION  02/2010; 09/2010  .   VENTRAL HERNIA REPAIR  2008   ventral epigastric hernia repair [Other]    Family History  Problem Relation Age of Onset  . Pancreatic cancer Father   . Diabetes Brother   . Heart disease Brother   . Colon cancer Maternal Grandmother   . Parkinson's disease Mother   . Lung cancer Brother     stage 4    Social History Social History  Substance Use Topics  . Smoking status: Former Smoker    Packs/day: 1.00    Years: 20.00    Types: Cigarettes    Quit date: 04/11/1989  . Smokeless tobacco: Never Used  . Alcohol use No    Allergies  Allergen Reactions  . Dapsone Other (See Comments)    REACTION: caused neuropathy/ affected walking  patient  stated he received high dose of dapsone which caused the neuropathy   . Gluten Meal Dermatitis    Dermatitis     Current Outpatient Prescriptions  Medication Sig Dispense Refill  . aspirin 325 MG tablet Take 325 mg by mouth daily.    . carvedilol (COREG) 3.125 MG tablet TAKE 1 TABLET BY MOUTH 2 (TWO) TIMES DAILY WITH MEALS. 180 tablet 1  . cholecalciferol (VITAMIN D) 1000 UNITS tablet Take 1,000 Units by mouth daily.    . methylcellulose (ARTIFICIAL TEARS) 1 % ophthalmic solution Place 1-2 drops into both eyes daily as needed (dry eyes).    . Multiple Vitamin (MULTIVITAMIN WITH MINERALS) TABS Take 1 tablet by mouth daily.    . Omega-3 Fatty Acids (FISH OIL PO) Take 1 capsule by mouth daily.    . rosuvastatin (CRESTOR) 20 MG tablet Take 20 mg by mouth daily. Reported on 06/16/2015    . white petrolatum (VASELINE) GEL Apply 1 application topically 2 (two) times daily. Applied to legs    . donepezil (ARICEPT) 5 MG tablet TAKE 1 TABLET (5 MG TOTAL) BY MOUTH ONCE DAILY.  11   No current facility-administered medications for this visit.     Review of Systems Review of Systems  Constitutional: Negative.   Respiratory: Negative.   Cardiovascular: Negative.     Blood pressure 132/64, pulse 62, resp. rate 12, height 6' (1.829 m), weight 151 lb (68.5 kg).  Physical Exam Physical Exam  Constitutional: He is oriented to person, place, and time. He appears well-developed and well-nourished.  Eyes: Conjunctivae are normal. No scleral icterus.  Neck: Neck supple. No thyromegaly present.  Cardiovascular: Normal rate, regular rhythm and normal heart sounds.   Pulmonary/Chest: Effort normal and breath sounds normal.  Abdominal: Soft. Bowel sounds are normal. There is splenomegaly. A hernia is present.  Lymphadenopathy:    He has axillary adenopathy.       Left axillary: Pectoral (1 cm lymph node ) adenopathy present.       Right: Inguinal (1 cm firm) adenopathy present.       Left: Inguinal (1  cm ) adenopathy present.  Small palpable nodes in both axillae and groins  Neurological: He is alert and oriented to person, place, and time.  Skin: Skin is warm and dry.    Data Reviewed PET scan and notes Based on imaging and US here the left axillary noide appears more suspicious.  Assessment   Splenomegaly, thrombocytopenia Enlarged lymph nodes-suspected lymphoma    Plan   Biopsy of left axillary node. Discussed fully with pt, procedure explained and he is agreeable.  Patient's surgery has been scheduled for 06-16-16 at [facility]. This patient has been asked to decrease   his 325 mg aspirin to 81 mg aspirin.   This information has been scribed by Rebeca Morris, CMA           Suleika Donavan G 06/13/2016, 10:06 AM   

## 2016-06-13 NOTE — Telephone Encounter (Signed)
Detailed message left on son's number with pre-admit testing appointment info.   Also, called the patient and notified him directly regarding appointment as well.

## 2016-06-13 NOTE — Patient Instructions (Signed)
Open Lymph Node Biopsy An open lymph node biopsy is a procedure to remove a lymph node so that it can be examined under a microscope. Lymph nodes are part of the body's disease-fighting (immune) system. The immune system protects the body from infections, germs, and diseases. An open lymph node biopsy may be done to:  Look for germs or cancer cells in your lymph node.  Find out why your lymph node is swollen.  Find out more about a condition you have. Lymph nodes are found in many locations in the body. Biopsies are often done on lymph nodes in the head, neck, armpit, or groin. Tell a health care provider about:  Any allergies you have.  All medicines you are taking, including vitamins, herbs, eye drops, creams, and over-the-counter medicines.  Any problems you or family members have had with anesthetic medicines.  Any blood disorders you have.  Any surgeries you have had.  Any medical conditions you have.  Whether you are pregnant or may be pregnant. What are the risks? Generally, this is a safe procedure. However, problems may occur, including:  Infection.  Bleeding.  Allergic reactions to medicines.  Damage to other structures or organs, such as a nerve.  Scarring. What happens before the procedure?  Follow instructions from your health care provider about eating or drinking restrictions.  Ask your health care provider about:  Changing or stopping your regular medicines. This is especially important if you are taking diabetes medicines or blood thinners.  Taking medicines such as aspirin and ibuprofen. These medicines can thin your blood. Do not take these medicines before your procedure if your health care provider instructs you not to.  Ask your health care provider how your surgical site will be marked or identified.  You may be given antibiotic medicine to help prevent infection.  You may have an exam or testing.  You may have a blood or urine sample  taken.  Plan to have someone take you home after the procedure.  If you will be going home right after the procedure, plan to have someone with you for 24 hours. What happens during the procedure?  An IV tube will be inserted into one of your veins.  To reduce your risk of infection:  Your health care team will wash or sanitize their hands.  The skin where your lymph node is located will be cleaned with a germ-killing (antiseptic) solution.  You will be given medicine (local anesthetic) to numb the area around your lymph node. You may also be given medicine to help you relax (sedative).  An incision will be made in the area where your lymph node is located.  Your lymph node will be removed.  Your incision will be closed with stitches (sutures).  An antibiotic ointment may be applied to your incision.  A bandage (dressing) will be placed over your incision. What happens after the procedure?  Do not drive for 24 hours if you received a sedative.  Your blood pressure, heart rate, breathing rate, and blood oxygen level will be monitored often until the medicines you were given have worn off.  You may have to wear compression stockings. These stockings help to prevent blood clots and reduce swelling in your legs. This information is not intended to replace advice given to you by your health care provider. Make sure you discuss any questions you have with your health care provider. Document Released: 04/24/2015 Document Revised: 09/03/2015 Document Reviewed: 07/23/2014 Elsevier Interactive Patient Education  2017   [employer].

## 2016-06-14 ENCOUNTER — Encounter
Admission: RE | Admit: 2016-06-14 | Discharge: 2016-06-14 | Disposition: A | Discharge status: Home / Self Care | Payer: Medicare Other | Source: Ambulatory Visit | Attending: General Surgery | Admitting: General Surgery

## 2016-06-14 DIAGNOSIS — Z7982 Long term (current) use of aspirin: Secondary | ICD-10-CM | POA: Diagnosis not present

## 2016-06-14 DIAGNOSIS — R161 Splenomegaly, not elsewhere classified: Secondary | ICD-10-CM | POA: Diagnosis not present

## 2016-06-14 DIAGNOSIS — I509 Heart failure, unspecified: Secondary | ICD-10-CM | POA: Diagnosis not present

## 2016-06-14 DIAGNOSIS — I255 Ischemic cardiomyopathy: Secondary | ICD-10-CM | POA: Diagnosis not present

## 2016-06-14 DIAGNOSIS — Z8673 Personal history of transient ischemic attack (TIA), and cerebral infarction without residual deficits: Secondary | ICD-10-CM | POA: Diagnosis not present

## 2016-06-14 DIAGNOSIS — D696 Thrombocytopenia, unspecified: Secondary | ICD-10-CM | POA: Diagnosis not present

## 2016-06-14 DIAGNOSIS — Z01818 Encounter for other preprocedural examination: Secondary | ICD-10-CM

## 2016-06-14 DIAGNOSIS — Z87891 Personal history of nicotine dependence: Secondary | ICD-10-CM | POA: Diagnosis not present

## 2016-06-14 DIAGNOSIS — N182 Chronic kidney disease, stage 2 (mild): Secondary | ICD-10-CM | POA: Diagnosis not present

## 2016-06-14 DIAGNOSIS — I2510 Atherosclerotic heart disease of native coronary artery without angina pectoris: Secondary | ICD-10-CM | POA: Diagnosis not present

## 2016-06-14 DIAGNOSIS — Z951 Presence of aortocoronary bypass graft: Secondary | ICD-10-CM | POA: Diagnosis not present

## 2016-06-14 DIAGNOSIS — Z95810 Presence of automatic (implantable) cardiac defibrillator: Secondary | ICD-10-CM | POA: Diagnosis not present

## 2016-06-14 DIAGNOSIS — I130 Hypertensive heart and chronic kidney disease with heart failure and stage 1 through stage 4 chronic kidney disease, or unspecified chronic kidney disease: Secondary | ICD-10-CM | POA: Diagnosis not present

## 2016-06-14 DIAGNOSIS — Z79899 Other long term (current) drug therapy: Secondary | ICD-10-CM | POA: Diagnosis not present

## 2016-06-14 DIAGNOSIS — G4733 Obstructive sleep apnea (adult) (pediatric): Secondary | ICD-10-CM | POA: Diagnosis not present

## 2016-06-14 DIAGNOSIS — E785 Hyperlipidemia, unspecified: Secondary | ICD-10-CM | POA: Diagnosis not present

## 2016-06-14 DIAGNOSIS — R591 Generalized enlarged lymph nodes: Secondary | ICD-10-CM | POA: Diagnosis present

## 2016-06-14 DIAGNOSIS — I252 Old myocardial infarction: Secondary | ICD-10-CM | POA: Diagnosis not present

## 2016-06-14 HISTORY — DX: Dyspnea, unspecified: R06.00

## 2016-06-14 NOTE — Patient Instructions (Signed)
Your procedure is scheduled on: 06/16/16 Thurs Report to Same Day Surgery 2nd floor medical mall [facility] Entrance-take elevator on left to 2nd floor.  Check in with surgery information desk.) To find out your arrival time please call [phone] between 1PM - 3PM on 06/15/16 Wed  Remember: Instructions that are not followed completely may result in serious medical risk, up to and including death, or upon the discretion of your surgeon and anesthesiologist your surgery may need to be rescheduled.    _x___ 1. Do not eat food or drink liquids after midnight. No gum chewing or                              hard candies.     __x__ 2. No Alcohol for 24 hours before or after surgery.   __x__3. No Smoking for 24 prior to surgery.   ____  4. Bring all medications with you on the day of surgery if instructed.    __x__ 5. Notify your doctor if there is any change in your medical condition     (cold, fever, infections).     Do not wear jewelry, make-up, hairpins, clips or nail polish.  Do not wear lotions, powders, or perfumes. You may wear deodorant.  Do not shave 48 hours prior to surgery. Men may shave face and neck.  Do not bring valuables to the hospital.    [facility] is not responsible for any belongings or valuables.               Contacts, dentures or bridgework may not be worn into surgery.  Leave your suitcase in the car. After surgery it may be brought to your room.  For patients admitted to the hospital, discharge time is determined by your                       treatment team.   Patients discharged the day of surgery will not be allowed to drive home.  You will need someone to drive you home and stay with you the night of your procedure.    Please read over the following fact sheets that you were given:   [facility] Preparing for Surgery and or MRSA Information   _x___ Take anti-hypertensive (unless it includes a diuretic), cardiac, seizure, asthma,     anti-reflux and  psychiatric medicines. These include:  1. carvedilol (COREG  2.donepezil (ARICEPT  3.  4.  5.  6.  ____Fleets enema or Magnesium Citrate as directed.   _x___ Use CHG Soap or sage wipes as directed on instruction sheet   ____ Use inhalers on the day of surgery and bring to hospital day of surgery  ____ Stop Metformin and Janumet 2 days prior to surgery.    ____ Take 1/2 of usual insulin dose the night before surgery and none on the morning     surgery.   _x___ Follow recommendations from Cardiologist, Pulmonologist or PCP regarding          stopping Aspirin, Coumadin, Pllavix ,Eliquis, Effient, or Pradaxa, and Pletal.  Stop Aspirin today.  X____Stop Anti-inflammatories such as Advil, Aleve, Ibuprofen, Motrin, Naproxen, Naprosyn, Goodies powders or aspirin products. OK to take Tylenol and                          Celebrex.   _x___ Stop supplements until after surgery.  But  may continue Vitamin D, Vitamin B,       and multivitamin.   ____ Bring C-Pap to the hospital.

## 2016-06-15 MED ORDER — CEFAZOLIN SODIUM-DEXTROSE 2-4 GM/100ML-% IV SOLN
2.0000 g | INTRAVENOUS | Status: AC
  Administered 2016-06-16: 2000 mg via INTRAVENOUS

## 2016-06-16 ENCOUNTER — Ambulatory Visit: Payer: Medicare Other | Admitting: Anesthesiology

## 2016-06-16 ENCOUNTER — Encounter: Payer: Self-pay | Admitting: Anesthesiology

## 2016-06-16 ENCOUNTER — Ambulatory Visit
Admission: RE | Admit: 2016-06-16 | Discharge: 2016-06-16 | Disposition: A | Discharge status: Home / Self Care | Payer: Medicare Other | Source: Ambulatory Visit | Attending: General Surgery | Admitting: General Surgery

## 2016-06-16 ENCOUNTER — Encounter
Admission: RE | Disposition: A | Discharge status: Home / Self Care | Payer: Self-pay | Source: Ambulatory Visit | Attending: General Surgery

## 2016-06-16 DIAGNOSIS — C8307 Small cell B-cell lymphoma, spleen: Secondary | ICD-10-CM | POA: Diagnosis not present

## 2016-06-16 DIAGNOSIS — I130 Hypertensive heart and chronic kidney disease with heart failure and stage 1 through stage 4 chronic kidney disease, or unspecified chronic kidney disease: Secondary | ICD-10-CM | POA: Insufficient documentation

## 2016-06-16 DIAGNOSIS — G4733 Obstructive sleep apnea (adult) (pediatric): Secondary | ICD-10-CM | POA: Insufficient documentation

## 2016-06-16 DIAGNOSIS — R161 Splenomegaly, not elsewhere classified: Secondary | ICD-10-CM | POA: Insufficient documentation

## 2016-06-16 DIAGNOSIS — R599 Enlarged lymph nodes, unspecified: Secondary | ICD-10-CM

## 2016-06-16 DIAGNOSIS — N182 Chronic kidney disease, stage 2 (mild): Secondary | ICD-10-CM | POA: Insufficient documentation

## 2016-06-16 DIAGNOSIS — D696 Thrombocytopenia, unspecified: Secondary | ICD-10-CM | POA: Insufficient documentation

## 2016-06-16 DIAGNOSIS — Z7982 Long term (current) use of aspirin: Secondary | ICD-10-CM | POA: Insufficient documentation

## 2016-06-16 DIAGNOSIS — I255 Ischemic cardiomyopathy: Secondary | ICD-10-CM | POA: Insufficient documentation

## 2016-06-16 DIAGNOSIS — C8510 Unspecified B-cell lymphoma, unspecified site: Secondary | ICD-10-CM

## 2016-06-16 DIAGNOSIS — R591 Generalized enlarged lymph nodes: Secondary | ICD-10-CM | POA: Insufficient documentation

## 2016-06-16 DIAGNOSIS — Z79899 Other long term (current) drug therapy: Secondary | ICD-10-CM | POA: Insufficient documentation

## 2016-06-16 DIAGNOSIS — I252 Old myocardial infarction: Secondary | ICD-10-CM | POA: Insufficient documentation

## 2016-06-16 DIAGNOSIS — I2510 Atherosclerotic heart disease of native coronary artery without angina pectoris: Secondary | ICD-10-CM | POA: Insufficient documentation

## 2016-06-16 DIAGNOSIS — Z87891 Personal history of nicotine dependence: Secondary | ICD-10-CM | POA: Insufficient documentation

## 2016-06-16 DIAGNOSIS — Z951 Presence of aortocoronary bypass graft: Secondary | ICD-10-CM | POA: Insufficient documentation

## 2016-06-16 DIAGNOSIS — I509 Heart failure, unspecified: Secondary | ICD-10-CM | POA: Insufficient documentation

## 2016-06-16 DIAGNOSIS — Z95810 Presence of automatic (implantable) cardiac defibrillator: Secondary | ICD-10-CM | POA: Insufficient documentation

## 2016-06-16 DIAGNOSIS — E785 Hyperlipidemia, unspecified: Secondary | ICD-10-CM | POA: Insufficient documentation

## 2016-06-16 DIAGNOSIS — Z8673 Personal history of transient ischemic attack (TIA), and cerebral infarction without residual deficits: Secondary | ICD-10-CM | POA: Insufficient documentation

## 2016-06-16 HISTORY — DX: Unspecified B-cell lymphoma, unspecified site: C85.10

## 2016-06-16 HISTORY — PX: AXILLARY LYMPH NODE BIOPSY: SHX5737

## 2016-06-16 SURGERY — AXILLARY LYMPH NODE BIOPSY
Anesthesia: Monitor Anesthesia Care | Laterality: Left | Wound class: Clean

## 2016-06-16 MED ORDER — ACETAMINOPHEN 10 MG/ML IV SOLN
INTRAVENOUS | Status: DC | PRN
  Administered 2016-06-16: 1000 mg via INTRAVENOUS

## 2016-06-16 MED ORDER — FENTANYL CITRATE (PF) 100 MCG/2ML IJ SOLN
INTRAMUSCULAR | Status: AC
  Filled 2016-06-16: qty 2

## 2016-06-16 MED ORDER — LACTATED RINGERS IV SOLN
INTRAVENOUS | Status: DC
  Administered 2016-06-16: 08:00:00 via INTRAVENOUS

## 2016-06-16 MED ORDER — BUPIVACAINE HCL (PF) 0.5 % IJ SOLN
INTRAMUSCULAR | Status: AC
  Filled 2016-06-16: qty 30

## 2016-06-16 MED ORDER — FAMOTIDINE 20 MG PO TABS
ORAL_TABLET | ORAL | Status: AC
  Administered 2016-06-16: 20 mg via ORAL
  Filled 2016-06-16: qty 1

## 2016-06-16 MED ORDER — EPHEDRINE SULFATE 50 MG/ML IJ SOLN
INTRAMUSCULAR | Status: DC | PRN
  Administered 2016-06-16 (×6): 5 mg via INTRAVENOUS

## 2016-06-16 MED ORDER — LIDOCAINE HCL (PF) 1 % IJ SOLN
INTRAMUSCULAR | Status: AC
  Filled 2016-06-16: qty 30

## 2016-06-16 MED ORDER — ACETAMINOPHEN 10 MG/ML IV SOLN
INTRAVENOUS | Status: AC
  Filled 2016-06-16: qty 100

## 2016-06-16 MED ORDER — PROPOFOL 10 MG/ML IV BOLUS
INTRAVENOUS | Status: DC | PRN
  Administered 2016-06-16: 20 mg via INTRAVENOUS

## 2016-06-16 MED ORDER — EPHEDRINE SULFATE 50 MG/ML IJ SOLN
INTRAMUSCULAR | Status: AC
  Filled 2016-06-16: qty 1

## 2016-06-16 MED ORDER — CHLORHEXIDINE GLUCONATE CLOTH 2 % EX PADS: 6.0000 | MEDICATED_PAD | Freq: Once | CUTANEOUS | Status: DC

## 2016-06-16 MED ORDER — BUPIVACAINE HCL (PF) 0.5 % IJ SOLN
INTRAMUSCULAR | Status: DC | PRN
  Administered 2016-06-16: 14 mL

## 2016-06-16 MED ORDER — ONDANSETRON HCL 4 MG/2ML IJ SOLN: 4.0000 mg | Freq: Once | INTRAMUSCULAR | Status: DC | PRN

## 2016-06-16 MED ORDER — FENTANYL CITRATE (PF) 100 MCG/2ML IJ SOLN: 25.0000 ug | INTRAMUSCULAR | Status: DC | PRN

## 2016-06-16 MED ORDER — PROPOFOL 10 MG/ML IV BOLUS
INTRAVENOUS | Status: AC
  Filled 2016-06-16: qty 40

## 2016-06-16 MED ORDER — PROPOFOL 500 MG/50ML IV EMUL
INTRAVENOUS | Status: DC | PRN
  Administered 2016-06-16: 100 ug/kg/min via INTRAVENOUS

## 2016-06-16 MED ORDER — CEFAZOLIN SODIUM-DEXTROSE 2-4 GM/100ML-% IV SOLN
INTRAVENOUS | Status: AC
  Administered 2016-06-16: 2000 mg via INTRAVENOUS
  Filled 2016-06-16: qty 100

## 2016-06-16 MED ORDER — LIDOCAINE HCL 1 % IJ SOLN
INTRAMUSCULAR | Status: DC | PRN
  Administered 2016-06-16: 14 mL

## 2016-06-16 MED ORDER — LIDOCAINE HCL 2 % EX GEL
CUTANEOUS | Status: AC
  Filled 2016-06-16: qty 5

## 2016-06-16 MED ORDER — TRAMADOL HCL 50 MG PO TABS: 50.0000 mg | ORAL_TABLET | Freq: Four times a day (QID) | ORAL | 0 refills | Status: DC | PRN

## 2016-06-16 MED ORDER — FENTANYL CITRATE (PF) 100 MCG/2ML IJ SOLN
INTRAMUSCULAR | Status: DC | PRN
  Administered 2016-06-16 (×4): 25 ug via INTRAVENOUS

## 2016-06-16 MED ORDER — FAMOTIDINE 20 MG PO TABS
20.0000 mg | ORAL_TABLET | Freq: Once | ORAL | Status: AC
  Administered 2016-06-16: 20 mg via ORAL

## 2016-06-16 MED ORDER — DEXMEDETOMIDINE HCL IN NACL 200 MCG/50ML IV SOLN
INTRAVENOUS | Status: DC | PRN
  Administered 2016-06-16 (×2): 12 ug via INTRAVENOUS

## 2016-06-16 SURGICAL SUPPLY — 43 items
ADH SKN CLS APL DERMABOND .7 (GAUZE/BANDAGES/DRESSINGS) ×1
APPLIER CLIP 11 MED OPEN (CLIP) ×3
APR CLP MED 11 20 MLT OPN (CLIP) ×1
BLADE SURG 15 STRL SS SAFETY (BLADE) ×3 IMPLANT
BULB RESERV EVAC DRAIN JP 100C (MISCELLANEOUS) ×3 IMPLANT
CANISTER SUCT 1200ML W/VALVE (MISCELLANEOUS) ×3 IMPLANT
CHLORAPREP W/TINT 26ML (MISCELLANEOUS) ×3 IMPLANT
CLIP APPLIE 11 MED OPEN (CLIP) ×1 IMPLANT
CNTNR SPEC 2.5X3XGRAD LEK (MISCELLANEOUS) ×3
CONT SPEC 4OZ STER OR WHT (MISCELLANEOUS) ×6
CONT SPEC 4OZ STRL OR WHT (MISCELLANEOUS) ×3
CONTAINER SPEC 2.5X3XGRAD LEK (MISCELLANEOUS) IMPLANT
COVER PROBE FLX POLY STRL (MISCELLANEOUS) ×2 IMPLANT
DERMABOND ADVANCED (GAUZE/BANDAGES/DRESSINGS) ×2
DERMABOND ADVANCED .7 DNX12 (GAUZE/BANDAGES/DRESSINGS) ×1 IMPLANT
DRAIN CHANNEL JP 15F RND 16 (MISCELLANEOUS) ×6 IMPLANT
DRAPE LAPAROTOMY TRNSV 106X77 (MISCELLANEOUS) ×3 IMPLANT
ELECT REM PT RETURN 9FT ADLT (ELECTROSURGICAL) ×3
ELECTRODE REM PT RTRN 9FT ADLT (ELECTROSURGICAL) ×1 IMPLANT
GLOVE BIO SURGEON STRL SZ7 (GLOVE) ×13 IMPLANT
GOWN STRL REUS W/ TWL LRG LVL3 (GOWN DISPOSABLE) ×3 IMPLANT
GOWN STRL REUS W/TWL LRG LVL3 (GOWN DISPOSABLE) ×12
HARMONIC SCALPEL FOCUS (MISCELLANEOUS) IMPLANT
KIT RM TURNOVER STRD PROC AR (KITS) ×3 IMPLANT
LABEL OR SOLS (LABEL) ×3 IMPLANT
NEEDLE HYPO 22GX1.5 SAFETY (NEEDLE) ×2 IMPLANT
NS IRRIG 1000ML POUR BTL (IV SOLUTION) ×3 IMPLANT
PACK BASIN MINOR ARMC (MISCELLANEOUS) ×3 IMPLANT
SPONGE LAP 18X18 5 PK (GAUZE/BANDAGES/DRESSINGS) ×3 IMPLANT
SUT ETH BLK MONO 3 0 FS 1 12/B (SUTURE) ×3 IMPLANT
SUT SILK 2 0 (SUTURE) ×3
SUT SILK 2-0 18XBRD TIE 12 (SUTURE) ×1 IMPLANT
SUT SILK 3 0 (SUTURE) ×3
SUT SILK 3-0 (SUTURE) ×3 IMPLANT
SUT SILK 3-0 18XBRD TIE 12 (SUTURE) ×1 IMPLANT
SUT SILK 4 0 (SUTURE) ×3
SUT SILK 4-0 18XBRD TIE 12 (SUTURE) ×1 IMPLANT
SUT VIC AB 2-0 CT1 27 (SUTURE) ×6
SUT VIC AB 2-0 CT1 TAPERPNT 27 (SUTURE) ×2 IMPLANT
SUT VIC AB 4-0 FS2 27 (SUTURE) ×3 IMPLANT
SUT VICRYL+ 3-0 144IN (SUTURE) ×3 IMPLANT
SYR 10ML LL (SYRINGE) ×2 IMPLANT
SYR BULB IRRIG 60ML STRL (SYRINGE) ×3 IMPLANT

## 2016-06-16 NOTE — Transfer of Care (Addendum)
Immediate Anesthesia Transfer of Care Note  Patient: Gary Ferrell  Procedure(s) Performed: Procedure(s): AXILLARY LYMPH NODE BIOPSY (Left)  Patient Location: PACU  Anesthesia Type:MAC  Level of Consciousness: awake, alert , oriented and patient cooperative  Airway & Oxygen Therapy: Patient Spontanous Breathing  Post-op Assessment: Report given to RN, Post -op Vital signs reviewed and stable and Patient moving all extremities  Post vital signs: Reviewed and stable  Last Vitals:  Vitals:   06/16/16 0755 06/16/16 1039  BP: (!) 142/48 (!) 109/47  Pulse: 65 60  Resp: 16 15  Temp: 36.1 C 37.1 C    Last Pain:  Vitals:   06/16/16 0755  TempSrc: Oral         Complications: No apparent anesthesia complications

## 2016-06-16 NOTE — H&P (View-Only) (Signed)
Patient ID: Gary Ferrell, male   DOB: 02/07/1936, 81 y.o.   MRN: [id number]  Chief Complaint  Patient presents with  . Other    Eval lymph node     HPI Gary Ferrell is a 81 y.o. male is here today for an evaluation of a left axillary lymph node. Patient had a PET scan done at [facility] on 05/31/16. Denies fever, chills, night sweats. Patient's son states he lost about 20 lbs. Patient's son Gary Ferrell is present. Denies family history of lymphoma.  History of thrombocytopenia and splenomegly and has been followed by oncology. Lymph node biopsy requested by oncologist. I have reviewed the history of present illness with the patient.  HPI  Past Medical History:  Diagnosis Date  . AICD (automatic cardioverter/defibrillator) present   . BPH (benign prostatic hyperplasia)    a. 06/2012 s/p Greenlight Laser Prostatectomy.  . Carotid disease, bilateral ([place])    a. 1990's s/p L CEA;  b. 11/2004 s/p redo L CEA;  c. 06/2013 Carotid U/S: RICA 30-07%, LICA patent.  . Chronic systolic CHF (congestive heart failure) ([place])    a. 04/2013 Echo: EF 35%, inf DK, inflat AK, diast dysfxn, RV HK, mild MR, nmildly dil LA.  . CKD (chronic kidney disease), stage II   . Coronary artery disease    a. 2007 s/p MI & CABG.  . Diverticulosis    a. 10/2013 colonoscopy (otw nl study).  Marland Kitchen GERD (gastroesophageal reflux disease)   . Hyperlipidemia   . Hypertension   . Ischemic cardiomyopathy    a. 04/2013 Echo: EF 35%;  b. 06/2013 s/p SJM Bi-V ICD, ser # [id number].  Marland Kitchen LBBB (left bundle branch block)   . Lung nodules   . Myocardial infarction   . OSA (obstructive sleep apnea)    a. 12/2013 Sleep study: severe OSA - CPAP-   . PAD (peripheral artery disease) ([place])    a. 1990's s/p Aortobifem bypass.  . Presence of permanent cardiac pacemaker   . Spinal stenosis   . Stricture of urethral meatus    a. 08/2009 s/p dil.  . Stroke [facility])    a. 2007 Periop CABG - no residual.    Past Surgical History:  Procedure Laterality  Date  . AORTA - BILATERAL FEMORAL ARTERY BYPASS GRAFT  1992  . APPENDECTOMY  1964  . BI-VENTRICULAR IMPLANTABLE CARDIOVERTER DEFIBRILLATOR N/A 06/25/2013   Procedure: BI-VENTRICULAR IMPLANTABLE CARDIOVERTER DEFIBRILLATOR  (CRT-D);  Surgeon: Gary Lance, MD;  Location: [facility] CATH LAB;  Service: Cardiovascular;  Laterality: N/A;  . BI-VENTRICULAR IMPLANTABLE CARDIOVERTER DEFIBRILLATOR  (CRT-D)  06/25/2013  . BI-VENTRICULAR IMPLANTABLE CARDIOVERTER DEFIBRILLATOR  (CRT-D)  06/25/13   STJ CRTD implanted by Dr Lovena Le  . CARDIAC CATHETERIZATION  2007; 2012   "unsuccessful attempt to put stents in in 2007, had MI, had to have CABG;"  . CAROTID ENDARTERECTOMY Left 1992; 2006  . CHOLECYSTECTOMY  ~ 1996  . COLON SURGERY  2007   herniated intestinal obstruction  . CORONARY ARTERY BYPASS GRAFT  2007   "CABG X3" (06/25/2013)  . GREEN LIGHT LASER TURP (TRANSURETHRAL RESECTION OF PROSTATE N/A 07/02/2012   Procedure: GREEN LIGHT LASER TURP (TRANSURETHRAL RESECTION OF PROSTATE;  Surgeon: Gary Rud, MD;  Location: [facility] ORS;  Service: Urology;  Laterality: N/A;  . SKIN GRAFT  2008   "area of colon surgery wouldn't heal; had this graft over area"  . TONSILLECTOMY    . URETHRAL MEATOPLASTY  08/2009  . URETHRAL STRICTURE DILATATION  02/2010; 09/2010  .  VENTRAL HERNIA REPAIR  2008   ventral epigastric hernia repair [Other]    Family History  Problem Relation Age of Onset  . Pancreatic cancer Father   . Diabetes Brother   . Heart disease Brother   . Colon cancer Maternal Grandmother   . Parkinson's disease Mother   . Lung cancer Brother     stage 4    Social History Social History  Substance Use Topics  . Smoking status: Former Smoker    Packs/day: 1.00    Years: 20.00    Types: Cigarettes    Quit date: 04/11/1989  . Smokeless tobacco: Never Used  . Alcohol use No    Allergies  Allergen Reactions  . Dapsone Other (See Comments)    REACTION: caused neuropathy/ affected walking  patient  stated he received high dose of dapsone which caused the neuropathy   . Gluten Meal Dermatitis    Dermatitis     Current Outpatient Prescriptions  Medication Sig Dispense Refill  . aspirin 325 MG tablet Take 325 mg by mouth daily.    . carvedilol (COREG) 3.125 MG tablet TAKE 1 TABLET BY MOUTH 2 (TWO) TIMES DAILY WITH MEALS. 180 tablet 1  . cholecalciferol (VITAMIN D) 1000 UNITS tablet Take 1,000 Units by mouth daily.    . methylcellulose (ARTIFICIAL TEARS) 1 % ophthalmic solution Place 1-2 drops into both eyes daily as needed (dry eyes).    . Multiple Vitamin (MULTIVITAMIN WITH MINERALS) TABS Take 1 tablet by mouth daily.    . Omega-3 Fatty Acids (FISH OIL PO) Take 1 capsule by mouth daily.    . rosuvastatin (CRESTOR) 20 MG tablet Take 20 mg by mouth daily. Reported on 06/16/2015    . white petrolatum (VASELINE) GEL Apply 1 application topically 2 (two) times daily. Applied to legs    . donepezil (ARICEPT) 5 MG tablet TAKE 1 TABLET (5 MG TOTAL) BY MOUTH ONCE DAILY.  11   No current facility-administered medications for this visit.     Review of Systems Review of Systems  Constitutional: Negative.   Respiratory: Negative.   Cardiovascular: Negative.     Blood pressure 132/64, pulse 62, resp. rate 12, height 6' (1.829 m), weight 151 lb (68.5 kg).  Physical Exam Physical Exam  Constitutional: He is oriented to person, place, and time. He appears well-developed and well-nourished.  Eyes: Conjunctivae are normal. No scleral icterus.  Neck: Neck supple. No thyromegaly present.  Cardiovascular: Normal rate, regular rhythm and normal heart sounds.   Pulmonary/Chest: Effort normal and breath sounds normal.  Abdominal: Soft. Bowel sounds are normal. There is splenomegaly. A hernia is present.  Lymphadenopathy:    He has axillary adenopathy.       Left axillary: Pectoral (1 cm lymph node ) adenopathy present.       Right: Inguinal (1 cm firm) adenopathy present.       Left: Inguinal (1  cm ) adenopathy present.  Small palpable nodes in both axillae and groins  Neurological: He is alert and oriented to person, place, and time.  Skin: Skin is warm and dry.    Data Reviewed PET scan and notes Based on imaging and [place] here the left axillary noide appears more suspicious.  Assessment   Splenomegaly, thrombocytopenia Enlarged lymph nodes-suspected lymphoma    Plan   Biopsy of left axillary node. Discussed fully with pt, procedure explained and he is agreeable.  Patient's surgery has been scheduled for 06-16-16 at [facility]. This patient has been asked to decrease  his 325 mg aspirin to 81 mg aspirin.   This information has been scribed by Verlene Mayer, CMA           SANKAR,SEEPLAPUTHUR G 06/13/2016, 10:06 AM

## 2016-06-16 NOTE — Interval H&P Note (Signed)
History and Physical Interval Note:  06/16/2016 8:53 AM  Gary Ferrell  has presented today for surgery, with the diagnosis of lymphadenopathy  The various methods of treatment have been discussed with the patient and family. After consideration of risks, benefits and other options for treatment, the patient has consented to  Procedure(s): AXILLARY LYMPH NODE BIOPSY (Left) as a surgical intervention .  The patient's history has been reviewed, patient examined, no change in status, stable for surgery.  I have reviewed the patient's chart and labs.  Questions were answered to the patient's satisfaction.     [name]

## 2016-06-16 NOTE — Anesthesia Post-op Follow-up Note (Cosign Needed)
Anesthesia QCDR form completed.        

## 2016-06-16 NOTE — Discharge Instructions (Signed)

## 2016-06-16 NOTE — Anesthesia Preprocedure Evaluation (Signed)
Anesthesia Evaluation  Patient identified by MRN, date of birth, ID band Patient awake    Reviewed: Allergy & Precautions, H&P , NPO status , Patient's Chart, lab work & pertinent test results, reviewed documented beta blocker date and time   Airway Mallampati: II  TM Distance: >3 FB Neck ROM: full    Dental no notable dental hx. (+) Teeth Intact   Pulmonary neg pulmonary ROS, shortness of breath, sleep apnea , former smoker,    Pulmonary exam normal breath sounds clear to auscultation       Cardiovascular Exercise Tolerance: Good hypertension, + CAD, + Past MI, + Peripheral Vascular Disease and +CHF  negative cardio ROS  + dysrhythmias + pacemaker + Cardiac Defibrillator  Rhythm:regular Rate:Normal     Neuro/Psych CVA, Residual Symptoms negative neurological ROS  negative psych ROS   GI/Hepatic negative GI ROS, Neg liver ROS, GERD  Medicated,  Endo/Other  negative endocrine ROSdiabetes  Renal/GU Renal disease     Musculoskeletal   Abdominal   Peds  Hematology negative hematology ROS (+)   Anesthesia Other Findings   Reproductive/Obstetrics negative OB ROS                             Anesthesia Physical Anesthesia Plan  ASA: IV  Anesthesia Plan: MAC   Post-op Pain Management:    Induction:   Airway Management Planned:   Additional Equipment:   Intra-op Plan:   Post-operative Plan:   Informed Consent: I have reviewed the patients History and Physical, chart, labs and discussed the procedure including the risks, benefits and alternatives for the proposed anesthesia with the patient or authorized representative who has indicated his/her understanding and acceptance.     Plan Discussed with: CRNA  Anesthesia Plan Comments:         Anesthesia Quick Evaluation

## 2016-06-16 NOTE — Op Note (Addendum)
Preop diagnosis:lymphadenopathy and splenomegaly  Post op diagnosis: same  Operation: excision left axillary node with US guidance  Surgeon: Mckinley Jewel  Assistant:     Anesthesia: MAC  Complications: none  EBL: less than 15 mL  Drains: none  Description: patient was placed supine on the operating table. Left axilla was prepped and draped as sterile field. With adequate sedation and monitoring local anesthetic of 0.5% Marcaine mixed with 1% Xylocaine was instilled, totaling about 25 mL.Timeout was performed. Ultrasound probe was brought up and the location of one suspicious node with mild cortical thickening was identified. Skin incision was made and dissection carried out to expose the axilla fat pad. With the use a 1-1/2 cm lymph node identified along with another half a centimeter node. These were the only 2 that appeared to be of significance based on palpation and ultrasound findings. These were removed and sent fresh to patholgy for lymphoma workup. Hemostasis obtained with cautery and ligatures of 3-0 Vicryl. Deeper tissue closed with 2-0 Vicryl. Skin closed with 4-0 Vicryl subcuticular. dermabond was applied. Patient subsequently returned to recovery room stable condition.

## 2016-06-17 NOTE — Anesthesia Postprocedure Evaluation (Signed)
Anesthesia Post Note  Patient: Gary Ferrell  Procedure(s) Performed: Procedure(s) (LRB): AXILLARY LYMPH NODE BIOPSY (Left)  Patient location during evaluation: PACU Anesthesia Type: MAC Level of consciousness: awake and alert Pain management: pain level controlled Vital Signs Assessment: post-procedure vital signs reviewed and stable Respiratory status: spontaneous breathing, nonlabored ventilation, respiratory function stable and patient connected to nasal cannula oxygen Cardiovascular status: blood pressure returned to baseline and stable Postop Assessment: no signs of nausea or vomiting Anesthetic complications: no     Last Vitals:  Vitals:   06/16/16 1117 06/16/16 1147  BP: (!) 117/32 (!) 111/47  Pulse: (!) 55 (!) 57  Resp: 18 18  Temp: 36.3 C     Last Pain:  Vitals:   06/17/16 0855  TempSrc:   PainSc: 0-No pain                 Molli Barrows

## 2016-06-21 ENCOUNTER — Encounter: Payer: Self-pay | Admitting: *Deleted

## 2016-06-21 ENCOUNTER — Inpatient Hospital Stay: Payer: Medicare Other | Admitting: Internal Medicine

## 2016-06-21 ENCOUNTER — Telehealth: Payer: Self-pay | Admitting: *Deleted

## 2016-06-21 NOTE — Telephone Encounter (Signed)
Spoke with patient. Results from pathology results are not available and will not be ready to tphe end of the week per pathology dept. Patient aware md will r/s pt's apt for 06/28/16 at 145pm. Pt read back his new apt time to RN. Pt thanked me for calling him.  Also left vm for son stating that the results not available and md apt will be delayed.

## 2016-06-21 NOTE — Progress Notes (Signed)
Contacted [facility] pathology dept for update on pathology results-axillary lymph node biopsy collected on 06/16/16. Per main lab-[facility] by Dr. Luana Shu. Pathology dept will contact [facility] to determine update on results. If results can not be obtained today, then Dr. Rogue Bussing would like today's md-result apt to be r/s to next week.

## 2016-06-22 ENCOUNTER — Telehealth: Payer: Self-pay | Admitting: Internal Medicine

## 2016-06-22 NOTE — Telephone Encounter (Signed)
Spoke to [facility] [place]; tissue positive for EBV FISH. Awaiting further work up.

## 2016-06-23 ENCOUNTER — Encounter: Payer: Self-pay | Admitting: General Surgery

## 2016-06-23 ENCOUNTER — Ambulatory Visit (INDEPENDENT_AMBULATORY_CARE_PROVIDER_SITE_OTHER): Payer: Medicare Other | Admitting: General Surgery

## 2016-06-23 VITALS — BP 130/72 | HR 66 | Resp 14 | Ht 70.0 in | Wt 151.0 lb

## 2016-06-23 DIAGNOSIS — R599 Enlarged lymph nodes, unspecified: Secondary | ICD-10-CM

## 2016-06-23 NOTE — Patient Instructions (Signed)
Return as needed

## 2016-06-23 NOTE — Progress Notes (Signed)
Patient ID: Gary Ferrell, male   DOB: 1936-03-22, 81 y.o.   MRN: [id number]  Chief Complaint  Patient presents with  . Routine Post Op    HPI BRIYAN Ferrell is a 81 y.o. male here today for his post op left axillary biopsy done on 06/16/2016. Patient states he is doing well. I have reviewed the history of present illness with the patient.   HPI  Past Medical History:  Diagnosis Date  . AICD (automatic cardioverter/defibrillator) present   . BPH (benign prostatic hyperplasia)    a. 06/2012 s/p Greenlight Laser Prostatectomy.  . Carotid disease, bilateral ([place])    a. 1990's s/p L CEA;  b. 11/2004 s/p redo L CEA;  c. 06/2013 Carotid U/S: RICA 95-09%, LICA patent.  . Chronic systolic CHF (congestive heart failure) ([place])    a. 04/2013 Echo: EF 35%, inf DK, inflat AK, diast dysfxn, RV HK, mild MR, nmildly dil LA.  . CKD (chronic kidney disease), stage II   . Coronary artery disease    a. 2007 s/p MI & CABG.  . Diverticulosis    a. 10/2013 colonoscopy (otw nl study).  Marland Kitchen Dyspnea   . GERD (gastroesophageal reflux disease)   . Hyperlipidemia   . Hypertension   . Ischemic cardiomyopathy    a. 04/2013 Echo: EF 35%;  b. 06/2013 s/p SJM Bi-V ICD, ser # [id number].  Marland Kitchen LBBB (left bundle branch block)   . Lung nodules   . Myocardial infarction   . OSA (obstructive sleep apnea)    a. 12/2013 Sleep study: severe OSA - CPAP-   . PAD (peripheral artery disease) ([place])    a. 1990's s/p Aortobifem bypass.  . Presence of permanent cardiac pacemaker   . Spinal stenosis   . Stricture of urethral meatus    a. 08/2009 s/p dil.  . Stroke [facility])    a. 2007 Periop CABG - no residual.    Past Surgical History:  Procedure Laterality Date  . AORTA - BILATERAL FEMORAL ARTERY BYPASS GRAFT  1992  . APPENDECTOMY  1964  . AXILLARY LYMPH NODE BIOPSY Left 06/16/2016   Procedure: AXILLARY LYMPH NODE BIOPSY;  Surgeon: Christene Lye, MD;  Location: [facility] ORS;  Service: General;  Laterality: Left;  . BI-VENTRICULAR  IMPLANTABLE CARDIOVERTER DEFIBRILLATOR N/A 06/25/2013   Procedure: BI-VENTRICULAR IMPLANTABLE CARDIOVERTER DEFIBRILLATOR  (CRT-D);  Surgeon: Evans Lance, MD;  Location: [facility] CATH LAB;  Service: Cardiovascular;  Laterality: N/A;  . BI-VENTRICULAR IMPLANTABLE CARDIOVERTER DEFIBRILLATOR  (CRT-D)  06/25/2013  . BI-VENTRICULAR IMPLANTABLE CARDIOVERTER DEFIBRILLATOR  (CRT-D)  06/25/13   STJ CRTD implanted by Dr Lovena Le  . CARDIAC CATHETERIZATION  2007; 2012   "unsuccessful attempt to put stents in in 2007, had MI, had to have CABG;"  . CAROTID ENDARTERECTOMY Left 1992; 2006  . CHOLECYSTECTOMY  ~ 1996  . COLON SURGERY  2007   herniated intestinal obstruction  . CORONARY ARTERY BYPASS GRAFT  2007   "CABG X3" (06/25/2013)  . GREEN LIGHT LASER TURP (TRANSURETHRAL RESECTION OF PROSTATE N/A 07/02/2012   Procedure: GREEN LIGHT LASER TURP (TRANSURETHRAL RESECTION OF PROSTATE;  Surgeon: Ailene Rud, MD;  Location: [facility] ORS;  Service: Urology;  Laterality: N/A;  . SKIN GRAFT  2008   "area of colon surgery wouldn't heal; had this graft over area"  . TONSILLECTOMY    . URETHRAL MEATOPLASTY  08/2009  . URETHRAL STRICTURE DILATATION  02/2010; 09/2010  . VENTRAL HERNIA REPAIR  2008   ventral epigastric hernia repair [Other]  Family History  Problem Relation Age of Onset  . Pancreatic cancer Father   . Diabetes Brother   . Heart disease Brother   . Colon cancer Maternal Grandmother   . Parkinson's disease Mother   . Lung cancer Brother     stage 4    Social History Social History  Substance Use Topics  . Smoking status: Former Smoker    Packs/day: 1.00    Years: 20.00    Types: Cigarettes    Quit date: 04/11/1989  . Smokeless tobacco: Never Used  . Alcohol use No    Allergies  Allergen Reactions  . Dapsone Other (See Comments)    REACTION: caused neuropathy/ affected walking  patient stated he received high dose of dapsone which caused the neuropathy   . Gluten Meal Dermatitis     Dermatitis     Current Outpatient Prescriptions  Medication Sig Dispense Refill  . aspirin 325 MG tablet Take 325 mg by mouth at bedtime.     . carvedilol (COREG) 3.125 MG tablet TAKE 1 TABLET BY MOUTH 2 (TWO) TIMES DAILY WITH MEALS. 180 tablet 1  . cholecalciferol (VITAMIN D) 1000 UNITS tablet Take 1,000 Units by mouth daily.    Marland Kitchen donepezil (ARICEPT) 5 MG tablet qam  11  . methylcellulose (ARTIFICIAL TEARS) 1 % ophthalmic solution Place 1-2 drops into both eyes daily as needed (dry eyes).    . Multiple Vitamin (MULTIVITAMIN WITH MINERALS) TABS Take 1 tablet by mouth daily.    . rosuvastatin (CRESTOR) 20 MG tablet Take 20 mg by mouth at bedtime. Reported on 06/16/2015    . traMADol (ULTRAM) 50 MG tablet Take 1 tablet (50 mg total) by mouth every 6 (six) hours as needed. 10 tablet 0  . white petrolatum (VASELINE) GEL Apply 1 application topically 2 (two) times daily. Applied to legs     No current facility-administered medications for this visit.     Review of Systems Review of Systems  Constitutional: Negative.   Respiratory: Negative.   Cardiovascular: Negative.     Blood pressure 130/72, pulse 66, resp. rate 14, height 5\' 10"  (1.778 m), weight 151 lb (68.5 kg).  Physical Exam Physical Exam  Constitutional: He is oriented to person, place, and time. He appears well-developed and well-nourished.  Neurological: He is alert and oriented to person, place, and time.  Skin: Skin is warm and dry.  Axillary incision is clean, dry, and healing well. Some moderate bruising around the incision and on the right chest wall.      Data Reviewed Prior notes. Pathology pending.   Assessment    Patient to be followed by Dr. Rogue Bussing. Treatment to be discussed pending pathology results.      Plan   Patient to return as needed.   This information has been scribed by Gaspar Cola CMA.   Pasty Manninen G 06/23/2016, 3:54 PM

## 2016-06-28 ENCOUNTER — Inpatient Hospital Stay: Payer: Medicare Other | Admitting: Internal Medicine

## 2016-06-28 ENCOUNTER — Telehealth: Payer: Self-pay | Admitting: *Deleted

## 2016-06-28 NOTE — Telephone Encounter (Signed)
RN contacted [facility]- spoke with Dr. Luana Shu.  Pathology samples were [facility] to [facility]. [facility] sent specimen out for a 2nd consult. Dr. Rogue Bussing made aware. Pt has a schedule apt today. Will cnl the apt today.  RN contacted patient and pt's son. Explained that Results for pathology -still not back. MD will have RN call pt/pt's son once results are available. Will not r/s today's apt at this time.

## 2016-07-07 LAB — SURGICAL PATHOLOGY

## 2016-07-11 ENCOUNTER — Telehealth: Payer: Self-pay | Admitting: Internal Medicine

## 2016-07-11 ENCOUNTER — Encounter: Payer: Self-pay | Admitting: General Surgery

## 2016-07-11 NOTE — Telephone Encounter (Signed)
msg sent to [facility]. Team to put pt on md sch. Tomorrow in [place].

## 2016-07-11 NOTE — Telephone Encounter (Signed)
Please have pt/son see me tomorrow [4/2] at [facility] at 8:30 am to review path/discuss plan of care.

## 2016-07-12 ENCOUNTER — Telehealth: Payer: Self-pay | Admitting: General Surgery

## 2016-07-12 ENCOUNTER — Inpatient Hospital Stay: Payer: Medicare Other | Attending: Internal Medicine | Admitting: Internal Medicine

## 2016-07-12 DIAGNOSIS — Z951 Presence of aortocoronary bypass graft: Secondary | ICD-10-CM | POA: Insufficient documentation

## 2016-07-12 DIAGNOSIS — I509 Heart failure, unspecified: Secondary | ICD-10-CM

## 2016-07-12 DIAGNOSIS — C8594 Non-Hodgkin lymphoma, unspecified, lymph nodes of axilla and upper limb: Secondary | ICD-10-CM | POA: Diagnosis not present

## 2016-07-12 DIAGNOSIS — I130 Hypertensive heart and chronic kidney disease with heart failure and stage 1 through stage 4 chronic kidney disease, or unspecified chronic kidney disease: Secondary | ICD-10-CM | POA: Diagnosis not present

## 2016-07-12 DIAGNOSIS — Z8673 Personal history of transient ischemic attack (TIA), and cerebral infarction without residual deficits: Secondary | ICD-10-CM | POA: Diagnosis not present

## 2016-07-12 DIAGNOSIS — Z7952 Long term (current) use of systemic steroids: Secondary | ICD-10-CM | POA: Insufficient documentation

## 2016-07-12 DIAGNOSIS — Z7982 Long term (current) use of aspirin: Secondary | ICD-10-CM | POA: Diagnosis not present

## 2016-07-12 DIAGNOSIS — Z5181 Encounter for therapeutic drug level monitoring: Secondary | ICD-10-CM | POA: Insufficient documentation

## 2016-07-12 DIAGNOSIS — G4733 Obstructive sleep apnea (adult) (pediatric): Secondary | ICD-10-CM

## 2016-07-12 DIAGNOSIS — E785 Hyperlipidemia, unspecified: Secondary | ICD-10-CM | POA: Diagnosis not present

## 2016-07-12 DIAGNOSIS — I2510 Atherosclerotic heart disease of native coronary artery without angina pectoris: Secondary | ICD-10-CM

## 2016-07-12 DIAGNOSIS — C8584 Other specified types of non-Hodgkin lymphoma, lymph nodes of axilla and upper limb: Secondary | ICD-10-CM | POA: Insufficient documentation

## 2016-07-12 DIAGNOSIS — I252 Old myocardial infarction: Secondary | ICD-10-CM | POA: Insufficient documentation

## 2016-07-12 DIAGNOSIS — I700 Atherosclerosis of aorta: Secondary | ICD-10-CM | POA: Insufficient documentation

## 2016-07-12 DIAGNOSIS — Z95810 Presence of automatic (implantable) cardiac defibrillator: Secondary | ICD-10-CM | POA: Insufficient documentation

## 2016-07-12 DIAGNOSIS — N400 Enlarged prostate without lower urinary tract symptoms: Secondary | ICD-10-CM | POA: Diagnosis not present

## 2016-07-12 DIAGNOSIS — Z87891 Personal history of nicotine dependence: Secondary | ICD-10-CM

## 2016-07-12 DIAGNOSIS — D696 Thrombocytopenia, unspecified: Secondary | ICD-10-CM

## 2016-07-12 DIAGNOSIS — Z950 Presence of cardiac pacemaker: Secondary | ICD-10-CM | POA: Insufficient documentation

## 2016-07-12 DIAGNOSIS — R21 Rash and other nonspecific skin eruption: Secondary | ICD-10-CM | POA: Insufficient documentation

## 2016-07-12 DIAGNOSIS — K219 Gastro-esophageal reflux disease without esophagitis: Secondary | ICD-10-CM | POA: Diagnosis not present

## 2016-07-12 DIAGNOSIS — I255 Ischemic cardiomyopathy: Secondary | ICD-10-CM | POA: Diagnosis not present

## 2016-07-12 DIAGNOSIS — N182 Chronic kidney disease, stage 2 (mild): Secondary | ICD-10-CM

## 2016-07-12 DIAGNOSIS — C8338 Diffuse large B-cell lymphoma, lymph nodes of multiple sites: Secondary | ICD-10-CM

## 2016-07-12 DIAGNOSIS — R161 Splenomegaly, not elsewhere classified: Secondary | ICD-10-CM | POA: Insufficient documentation

## 2016-07-12 DIAGNOSIS — Z801 Family history of malignant neoplasm of trachea, bronchus and lung: Secondary | ICD-10-CM | POA: Insufficient documentation

## 2016-07-12 DIAGNOSIS — F0390 Unspecified dementia without behavioral disturbance: Secondary | ICD-10-CM | POA: Diagnosis not present

## 2016-07-12 DIAGNOSIS — Z800 Family history of malignant neoplasm of digestive organs: Secondary | ICD-10-CM

## 2016-07-12 DIAGNOSIS — Z79899 Other long term (current) drug therapy: Secondary | ICD-10-CM | POA: Diagnosis not present

## 2016-07-12 NOTE — Telephone Encounter (Signed)
ROBIN WITH THE [facility] CALLED & STATED DR Rogue Bussing WOULD LIKE PATIENT TO HAVE A  PORT PLACEMENT VERY SOON.DR Jamal Collin LAST SAW THE PATIENT 06-23-16.DOES HE NEED TO BE SEEN IN THE OFFICE BEFORE PROCEDURE? PLEASE ADVISE.

## 2016-07-12 NOTE — Patient Instructions (Signed)
Rituximab injection What is this medicine? RITUXIMAB (ri TUX i mab) is a monoclonal antibody. It is used to treat certain types of cancer like non-Hodgkin lymphoma and chronic lymphocytic leukemia. It is also used to treat rheumatoid arthritis, granulomatosis with polyangiitis (or Wegener's granulomatosis), and microscopic polyangiitis. This medicine may be used for other purposes; ask your health care provider or pharmacist if you have questions. COMMON BRAND NAME(S): Rituxan What should I tell my health care provider before I take this medicine? They need to know if you have any of these conditions: -heart disease -infection (especially a virus infection such as hepatitis B, chickenpox, cold sores, or herpes) -immune system problems -irregular heartbeat -kidney disease -lung or breathing disease, like asthma -recently received or scheduled to receive a vaccine -an unusual or allergic reaction to rituximab, mouse proteins, other medicines, foods, dyes, or preservatives -pregnant or trying to get pregnant -breast-feeding How should I use this medicine? This medicine is for infusion into a vein. It is administered in a hospital or clinic by a specially trained health care professional. A special MedGuide will be given to you by the pharmacist with each prescription and refill. Be sure to read this information carefully each time. Talk to your pediatrician regarding the use of this medicine in children. This medicine is not approved for use in children. Overdosage: If you think you have taken too much of this medicine contact a poison control center or emergency room at once. NOTE: This medicine is only for you. Do not share this medicine with others. What if I miss a dose? It is important not to miss a dose. Call your doctor or health care professional if you are unable to keep an appointment. What may interact with this medicine? -cisplatin -other medicines for arthritis like disease  modifying antirheumatic drugs or tumor necrosis factor inhibitors -live virus vaccines This list may not describe all possible interactions. Give your health care provider a list of all the medicines, herbs, non-prescription drugs, or dietary supplements you use. Also tell them if you smoke, drink alcohol, or use illegal drugs. Some items may interact with your medicine. What should I watch for while using this medicine? Your condition will be monitored carefully while you are receiving this medicine. You may need blood work done while you are taking this medicine. This medicine can cause serious allergic reactions. To reduce your risk you may need to take medicine before treatment with this medicine. Take your medicine as directed. In some patients, this medicine may cause a serious brain infection that may cause death. If you have any problems seeing, thinking, speaking, walking, or standing, tell your doctor right away. If you cannot reach your doctor, urgently seek other source of medical care. Call your doctor or health care professional for advice if you get a fever, chills or sore throat, or other symptoms of a cold or flu. Do not treat yourself. This drug decreases your body's ability to fight infections. Try to avoid being around people who are sick. Do not become pregnant while taking this medicine or for 12 months after stopping it. Women should inform their doctor if they wish to become pregnant or think they might be pregnant. There is a potential for serious side effects to an unborn child. Talk to your health care professional or pharmacist for more information. What side effects may I notice from receiving this medicine? Side effects that you should report to your doctor or health care professional as soon as possible: -breathing   problems -chest pain -dizziness or feeling faint -fast, irregular heartbeat -low blood counts - this medicine may decrease the number of white blood cells,  red blood cells and platelets. You may be at increased risk for infections and bleeding. -mouth sores -redness, blistering, peeling or loosening of the skin, including inside the mouth (this can be added for any serious or exfoliative rash that could lead to hospitalization) -signs of infection - fever or chills, cough, sore throat, pain or difficulty passing urine -signs and symptoms of kidney injury like trouble passing urine or change in the amount of urine -signs and symptoms of liver injury like dark yellow or brown urine; general ill feeling or flu-like symptoms; light-colored stools; loss of appetite; nausea; right upper belly pain; unusually weak or tired; yellowing of the eyes or skin -stomach pain -vomiting Side effects that usually do not require medical attention (report to your doctor or health care professional if they continue or are bothersome): -headache -joint pain -muscle cramps or muscle pain This list may not describe all possible side effects. Call your doctor for medical advice about side effects. You may report side effects to FDA at 1-800-FDA-1088. Where should I keep my medicine? This drug is given in a hospital or clinic and will not be stored at home. NOTE: This sheet is a summary. It may not cover all possible information. If you have questions about this medicine, talk to your doctor, pharmacist, or health care provider.  2018 Elsevier/Gold Standard (2015-11-04 15:28:09) Vincristine injection What is this medicine? VINCRISTINE (vin KRIS teen) is a chemotherapy drug. It slows the growth of cancer cells. This medicine is used to treat many types of cancer like Hodgkin's disease, leukemia, non-Hodgkin's lymphoma, neuroblastoma (brain cancer), rhabdomyosarcoma, and Wilms' tumor. This medicine may be used for other purposes; ask your health care provider or pharmacist if you have questions. COMMON BRAND NAME(S): Oncovin, Vincasar PFS What should I tell my health care  provider before I take this medicine? They need to know if you have any of these conditions: -blood disorders -gout -infection (especially chickenpox, cold sores, or herpes) -kidney disease -liver disease -lung disease -nervous system disease like Charcot-Marie-Tooth (CMT) -recent or ongoing radiation therapy -an unusual or allergic reaction to vincristine, other chemotherapy agents, other medicines, foods, dyes, or preservatives -pregnant or trying to get pregnant -breast-feeding How should I use this medicine? This drug is given as an infusion into a vein. It is administered in a hospital or clinic by a specially trained health care professional. If you have pain, swelling, burning, or any unusual feeling around the site of your injection, tell your health care professional right away. Talk to your pediatrician regarding the use of this medicine in children. While this drug may be prescribed for selected conditions, precautions do apply. Overdosage: If you think you have taken too much of this medicine contact a poison control center or emergency room at once. NOTE: This medicine is only for you. Do not share this medicine with others. What if I miss a dose? It is important not to miss your dose. Call your doctor or health care professional if you are unable to keep an appointment. What may interact with this medicine? Do not take this medicine with any of the following medications: -itraconazole -mibefradil -voriconazole This medicine may also interact with the following medications: -cyclosporine -erythromycin -fluconazole -ketoconazole -medicines for HIV like delavirdine, efavirenz, nevirapine -medicines for seizures like ethotoin, fosphenotoin, phenytoin -medicines to increase blood counts like filgrastim, pegfilgrastim, sargramostim -  other chemotherapy drugs like cisplatin, L-asparaginase, methotrexate, mitomycin, paclitaxel -pegaspargase -vaccines -zalcitabine, ddC Talk  to your doctor or health care professional before taking any of these medicines: -acetaminophen -aspirin -ibuprofen -ketoprofen -naproxen This list may not describe all possible interactions. Give your health care provider a list of all the medicines, herbs, non-prescription drugs, or dietary supplements you use. Also tell them if you smoke, drink alcohol, or use illegal drugs. Some items may interact with your medicine. What should I watch for while using this medicine? Your condition will be monitored carefully while you are receiving this medicine. You will need important blood work done while you are taking this medicine. This drug may make you feel generally unwell. This is not uncommon, as chemotherapy can affect healthy cells as well as cancer cells. Report any side effects. Continue your course of treatment even though you feel ill unless your doctor tells you to stop. In some cases, you may be given additional medicines to help with side effects. Follow all directions for their use. Call your doctor or health care professional for advice if you get a fever, chills or sore throat, or other symptoms of a cold or flu. Do not treat yourself. Avoid taking products that contain aspirin, acetaminophen, ibuprofen, naproxen, or ketoprofen unless instructed by your doctor. These medicines may hide a fever. Do not become pregnant while taking this medicine. Women should inform their doctor if they wish to become pregnant or think they might be pregnant. There is a potential for serious side effects to an unborn child. Talk to your health care professional or pharmacist for more information. Do not breast-feed an infant while taking this medicine. Men may have a lower sperm count while taking this medicine. Talk to your doctor if you plan to father a child. What side effects may I notice from receiving this medicine? Side effects that you should report to your doctor or health care professional as soon  as possible: -allergic reactions like skin rash, itching or hives, swelling of the face, lips, or tongue -breathing problems -confusion or changes in emotions or moods -constipation -cough -mouth sores -muscle weakness -nausea and vomiting -pain, swelling, redness or irritation at the injection site -pain, tingling, numbness in the hands or feet -problems with balance, talking, walking -seizures -stomach pain -trouble passing urine or change in the amount of urine Side effects that usually do not require medical attention (report to your doctor or health care professional if they continue or are bothersome): -diarrhea -hair loss -jaw pain -loss of appetite This list may not describe all possible side effects. Call your doctor for medical advice about side effects. You may report side effects to FDA at 1-800-FDA-1088. Where should I keep my medicine? This drug is given in a hospital or clinic and will not be stored at home. NOTE: This sheet is a summary. It may not cover all possible information. If you have questions about this medicine, talk to your doctor, pharmacist, or health care provider.  2018 Elsevier/Gold Standard (2007-12-24 17:17:13) Etoposide, VP-16 injection What is this medicine? ETOPOSIDE, VP-16 (e toe POE side) is a chemotherapy drug. It is used to treat testicular cancer, lung cancer, and other cancers. This medicine may be used for other purposes; ask your health care provider or pharmacist if you have questions. COMMON BRAND NAME(S): Etopophos, Toposar, VePesid What should I tell my health care provider before I take this medicine? They need to know if you have any of these conditions: -infection -kidney disease -  liver disease -low blood counts, like low white cell, platelet, or red cell counts -an unusual or allergic reaction to etoposide, other medicines, foods, dyes, or preservatives -pregnant or trying to get pregnant -breast-feeding How should I use  this medicine? This medicine is for infusion into a vein. It is administered in a hospital or clinic by a specially trained health care professional. Talk to your pediatrician regarding the use of this medicine in children. Special care may be needed. Overdosage: If you think you have taken too much of this medicine contact a poison control center or emergency room at once. NOTE: This medicine is only for you. Do not share this medicine with others. What if I miss a dose? It is important not to miss your dose. Call your doctor or health care professional if you are unable to keep an appointment. What may interact with this medicine? -aspirin -certain medications for seizures like carbamazepine, phenobarbital, phenytoin, valproic acid -cyclosporine -levamisole -warfarin This list may not describe all possible interactions. Give your health care provider a list of all the medicines, herbs, non-prescription drugs, or dietary supplements you use. Also tell them if you smoke, drink alcohol, or use illegal drugs. Some items may interact with your medicine. What should I watch for while using this medicine? Visit your doctor for checks on your progress. This drug may make you feel generally unwell. This is not uncommon, as chemotherapy can affect healthy cells as well as cancer cells. Report any side effects. Continue your course of treatment even though you feel ill unless your doctor tells you to stop. In some cases, you may be given additional medicines to help with side effects. Follow all directions for their use. Call your doctor or health care professional for advice if you get a fever, chills or sore throat, or other symptoms of a cold or flu. Do not treat yourself. This drug decreases your body's ability to fight infections. Try to avoid being around people who are sick. This medicine may increase your risk to bruise or bleed. Call your doctor or health care professional if you notice any unusual  bleeding. Talk to your doctor about your risk of cancer. You may be more at risk for certain types of cancers if you take this medicine. Do not become pregnant while taking this medicine or for at least 6 months after stopping it. Women should inform their doctor if they wish to become pregnant or think they might be pregnant. Women of child-bearing potential will need to have a negative pregnancy test before starting this medicine. There is a potential for serious side effects to an unborn child. Talk to your health care professional or pharmacist for more information. Do not breast-feed an infant while taking this medicine. Men must use a latex condom during sexual contact with a woman while taking this medicine and for at least 4 months after stopping it. A latex condom is needed even if you have had a vasectomy. Contact your doctor right away if your partner becomes pregnant. Do not donate sperm while taking this medicine and for at least 4 months after you stop taking this medicine. Men should inform their doctors if they wish to father a child. This medicine may lower sperm counts. What side effects may I notice from receiving this medicine? Side effects that you should report to your doctor or health care professional as soon as possible: -allergic reactions like skin rash, itching or hives, swelling of the face, lips, or tongue -low  blood counts - this medicine may decrease the number of white blood cells, red blood cells and platelets. You may be at increased risk for infections and bleeding. -signs of infection - fever or chills, cough, sore throat, pain or difficulty passing urine -signs of decreased platelets or bleeding - bruising, pinpoint red spots on the skin, black, tarry stools, blood in the urine -signs of decreased red blood cells - unusually weak or tired, fainting spells, lightheadedness -breathing problems -changes in vision -mouth or throat sores or ulcers -pain, redness,  swelling or irritation at the injection site -pain, tingling, numbness in the hands or feet -redness, blistering, peeling or loosening of the skin, including inside the mouth -seizures -vomiting Side effects that usually do not require medical attention (report to your doctor or health care professional if they continue or are bothersome): -diarrhea -hair loss -loss of appetite -nausea -stomach pain This list may not describe all possible side effects. Call your doctor for medical advice about side effects. You may report side effects to FDA at 1-800-FDA-1088. Where should I keep my medicine? This drug is given in a hospital or clinic and will not be stored at home. NOTE: This sheet is a summary. It may not cover all possible information. If you have questions about this medicine, talk to your doctor, pharmacist, or health care provider.  2018 Elsevier/Gold Standard (2015-03-20 11:53:23) Cyclophosphamide injection What is this medicine? CYCLOPHOSPHAMIDE (sye kloe FOSS fa mide) is a chemotherapy drug. It slows the growth of cancer cells. This medicine is used to treat many types of cancer like lymphoma, myeloma, leukemia, breast cancer, and ovarian cancer, to name a few. This medicine may be used for other purposes; ask your health care provider or pharmacist if you have questions. COMMON BRAND NAME(S): Cytoxan, Neosar What should I tell my health care provider before I take this medicine? They need to know if you have any of these conditions: -blood disorders -history of other chemotherapy -infection -kidney disease -liver disease -recent or ongoing radiation therapy -tumors in the bone marrow -an unusual or allergic reaction to cyclophosphamide, other chemotherapy, other medicines, foods, dyes, or preservatives -pregnant or trying to get pregnant -breast-feeding How should I use this medicine? This drug is usually given as an injection into a vein or muscle or by infusion into a  vein. It is administered in a hospital or clinic by a specially trained health care professional. Talk to your pediatrician regarding the use of this medicine in children. Special care may be needed. Overdosage: If you think you have taken too much of this medicine contact a poison control center or emergency room at once. NOTE: This medicine is only for you. Do not share this medicine with others. What if I miss a dose? It is important not to miss your dose. Call your doctor or health care professional if you are unable to keep an appointment. What may interact with this medicine? This medicine may interact with the following medications: -amiodarone -amphotericin B -azathioprine -certain antiviral medicines for HIV or AIDS such as protease inhibitors (e.g., indinavir, ritonavir) and zidovudine -certain blood pressure medications such as benazepril, captopril, enalapril, fosinopril, lisinopril, moexipril, monopril, perindopril, quinapril, ramipril, trandolapril -certain cancer medications such as anthracyclines (e.g., daunorubicin, doxorubicin), busulfan, cytarabine, paclitaxel, pentostatin, tamoxifen, trastuzumab -certain diuretics such as chlorothiazide, chlorthalidone, hydrochlorothiazide, indapamide, metolazone -certain medicines that treat or prevent blood clots like warfarin -certain muscle relaxants such as succinylcholine -cyclosporine -etanercept -indomethacin -medicines to increase blood counts like filgrastim, pegfilgrastim, sargramostim -  medicines used as general anesthesia -metronidazole -natalizumab This list may not describe all possible interactions. Give your health care provider a list of all the medicines, herbs, non-prescription drugs, or dietary supplements you use. Also tell them if you smoke, drink alcohol, or use illegal drugs. Some items may interact with your medicine. What should I watch for while using this medicine? Visit your doctor for checks on your  progress. This drug may make you feel generally unwell. This is not uncommon, as chemotherapy can affect healthy cells as well as cancer cells. Report any side effects. Continue your course of treatment even though you feel ill unless your doctor tells you to stop. Drink water or other fluids as directed. Urinate often, even at night. In some cases, you may be given additional medicines to help with side effects. Follow all directions for their use. Call your doctor or health care professional for advice if you get a fever, chills or sore throat, or other symptoms of a cold or flu. Do not treat yourself. This drug decreases your body's ability to fight infections. Try to avoid being around people who are sick. This medicine may increase your risk to bruise or bleed. Call your doctor or health care professional if you notice any unusual bleeding. Be careful brushing and flossing your teeth or using a toothpick because you may get an infection or bleed more easily. If you have any dental work done, tell your dentist you are receiving this medicine. You may get drowsy or dizzy. Do not drive, use machinery, or do anything that needs mental alertness until you know how this medicine affects you. Do not become pregnant while taking this medicine or for 1 year after stopping it. Women should inform their doctor if they wish to become pregnant or think they might be pregnant. Men should not father a child while taking this medicine and for 4 months after stopping it. There is a potential for serious side effects to an unborn child. Talk to your health care professional or pharmacist for more information. Do not breast-feed an infant while taking this medicine. This medicine may interfere with the ability to have a child. This medicine has caused ovarian failure in some women. This medicine has caused reduced sperm counts in some men. You should talk with your doctor or health care professional if you are concerned  about your fertility. If you are going to have surgery, tell your doctor or health care professional that you have taken this medicine. What side effects may I notice from receiving this medicine? Side effects that you should report to your doctor or health care professional as soon as possible: -allergic reactions like skin rash, itching or hives, swelling of the face, lips, or tongue -low blood counts - this medicine may decrease the number of white blood cells, red blood cells and platelets. You may be at increased risk for infections and bleeding. -signs of infection - fever or chills, cough, sore throat, pain or difficulty passing urine -signs of decreased platelets or bleeding - bruising, pinpoint red spots on the skin, black, tarry stools, blood in the urine -signs of decreased red blood cells - unusually weak or tired, fainting spells, lightheadedness -breathing problems -dark urine -dizziness -palpitations -swelling of the ankles, feet, hands -trouble passing urine or change in the amount of urine -weight gain -yellowing of the eyes or skin Side effects that usually do not require medical attention (report to your doctor or health care professional if they continue  or are bothersome): -changes in nail or skin color -hair loss -missed menstrual periods -mouth sores -nausea, vomiting This list may not describe all possible side effects. Call your doctor for medical advice about side effects. You may report side effects to FDA at 1-800-FDA-1088. Where should I keep my medicine? This drug is given in a hospital or clinic and will not be stored at home. NOTE: This sheet is a summary. It may not cover all possible information. If you have questions about this medicine, talk to your doctor, pharmacist, or health care provider.  2018 [employer]vier/Gold Standard (2012-02-10 16:22:58)

## 2016-07-12 NOTE — Progress Notes (Signed)
START ON PATHWAY REGIMEN - Lymphoma and CLL     A cycle is every 21 days:     Rituximab      Cyclophosphamide      Etoposide      Vincristine      Prednisone   **Always confirm dose/schedule in your pharmacy ordering system**    Patient Characteristics: Diffuse Large B Cell, First Line, Stage III and IV Disease Type: Not Applicable Disease Type: Diffuse Large B Cell Line of therapy: First Line [place] Stage: IV  Intent of Therapy: Curative Intent, Discussed with Patient 

## 2016-07-12 NOTE — Progress Notes (Signed)
[place] NOTE  Patient Care Team: Sharyne Peach, MD as PCP - General (Family Medicine) Shela Nevin, MD as Attending Physician (Cardiology) Cammie Sickle, MD as Consulting Physician (Internal Medicine) Christene Lye, MD as Surgeon (General Surgery)  CHIEF COMPLAINTS/PURPOSE OF CONSULTATION:   Oncology History   # MARCH- April 2018[facility] [EBV positive; R. Ax LN; Dr.Sankar]; ? ABC subtype; FEB 20th 2018- PET Borderline enlarged hypermetabolic lymph nodes ] throughout the neck, chest, abdomen and pelvis, together with hypermetabolic Splenomegaly.   # April 2018- R-CEOP [hx of ICMP]      # 2014- THROMBOCYTOPENIA [130s-140s] sec to splenomegaly; Feb- 2017- 107/N-Hb [hep B/C/HIV- Neg]; US-moderate splenomegaly. March 2017- BMBx- Megakaryocytosis/ otherwise no obvious dyspoiesis/ or malignancy; no increase in reticulin fibrosis; cytogenetics-n.    # 2017- ? 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  male Caucasian patient is here to follow up for  thrombocytopenia/ splenomegaly/Lymphadenopathy is here for follow-up- review the results of the LN excisional biopsy that was done appx 3 weeks ago.  In the interim patient was evaluated at [facility] for neurocognitive decline. He is diagnosed with likely Alzheimer's. He has been started on Aricept.  Poor appetite. Weight stable. Denies any unusual bleeding. Denies any night sweats or fevers.  Patient noticed to have a rash on his extremities non-itchy over the last 2 weeks also. He has not had mentioned this to anybody else. No sores  in the mouth.  ROS: A complete 10 point review of system is done which is negative except mentioned above in history of present illness  MEDICAL HISTORY:  Past Medical History:  Diagnosis Date  . AICD (automatic cardioverter/defibrillator) present   . BPH (benign prostatic hyperplasia)    a. 06/2012 s/p Greenlight Laser Prostatectomy.  . Carotid disease, bilateral ([place])    a. 1990's s/p L CEA;  b. 11/2004 s/p redo L CEA;  c. 06/2013 Carotid U/S: RICA 51-76%, LICA patent.  . Chronic systolic CHF (congestive heart failure) ([place])    a. 04/2013 Echo: EF 35%, inf DK, inflat AK, diast dysfxn, RV HK, mild MR, nmildly dil LA.  . CKD (chronic kidney disease), stage II   . Coronary artery disease    a. 2007 s/p MI & CABG.  . Diverticulosis    a. 10/2013 colonoscopy (otw nl study).  Marland Kitchen Dyspnea   . GERD (gastroesophageal reflux disease)   . Hyperlipidemia   . Hypertension   . Ischemic cardiomyopathy    a. 04/2013 Echo: EF 35%;  b. 06/2013 s/p SJM Bi-V ICD, ser # [id number].  . Large B-cell lymphoma ([place]) 06/16/2016  . LBBB (left bundle branch block)   . Lung nodules   . Myocardial infarction   . OSA (obstructive sleep apnea)    a. 12/2013 Sleep study: severe OSA - CPAP-   . PAD (peripheral artery disease) ([place])    a. 1990's s/p Aortobifem bypass.  . Presence of permanent cardiac pacemaker   . Spinal stenosis   . Stricture of urethral meatus    a. 08/2009 s/p dil.  . Stroke ([place])  a. 2007 Periop CABG - no residual.    SURGICAL HISTORY: Past Surgical History:  Procedure Laterality Date  . AORTA - BILATERAL FEMORAL ARTERY BYPASS GRAFT  1992  . APPENDECTOMY  1964  . AXILLARY LYMPH NODE BIOPSY Left 06/16/2016   LARGE B-CELL LYMPHOMA/ AXILLARY LYMPH NODE BIOPSY;  Surgeon: Christene Lye, MD;  Location: [facility] ORS;  Service: General;  Laterality: Left;  . BI-VENTRICULAR IMPLANTABLE CARDIOVERTER DEFIBRILLATOR N/A 06/25/2013   Procedure: BI-VENTRICULAR IMPLANTABLE CARDIOVERTER DEFIBRILLATOR   (CRT-D);  Surgeon: Evans Lance, MD;  Location: [facility] CATH LAB;  Service: Cardiovascular;  Laterality: N/A;  . BI-VENTRICULAR IMPLANTABLE CARDIOVERTER DEFIBRILLATOR  (CRT-D)  06/25/2013  . BI-VENTRICULAR IMPLANTABLE CARDIOVERTER DEFIBRILLATOR  (CRT-D)  06/25/13   STJ CRTD implanted by Dr Lovena Le  . CARDIAC CATHETERIZATION  2007; 2012   "unsuccessful attempt to put stents in in 2007, had MI, had to have CABG;"  . CAROTID ENDARTERECTOMY Left 1992; 2006  . CHOLECYSTECTOMY  ~ 1996  . COLON SURGERY  2007   herniated intestinal obstruction  . CORONARY ARTERY BYPASS GRAFT  2007   "CABG X3" (06/25/2013)  . GREEN LIGHT LASER TURP (TRANSURETHRAL RESECTION OF PROSTATE N/A 07/02/2012   Procedure: GREEN LIGHT LASER TURP (TRANSURETHRAL RESECTION OF PROSTATE;  Surgeon: Ailene Rud, MD;  Location: [facility] ORS;  Service: Urology;  Laterality: N/A;  . SKIN GRAFT  2008   "area of colon surgery wouldn't heal; had this graft over area"  . TONSILLECTOMY    . URETHRAL MEATOPLASTY  08/2009  . URETHRAL STRICTURE DILATATION  02/2010; 09/2010  . VENTRAL HERNIA REPAIR  2008   ventral epigastric hernia repair [Other]    SOCIAL HISTORY:  Patient lives in  [place];   Quit smoking 1992;  No alcohol. He lives alone.  Retired Medical illustrator. Social History   Social History  . Marital status: Married    Spouse name: N/A  . Number of children: 1  . Years of education: N/A   Occupational History  . Retired    Social History Main Topics  . Smoking status: Former Smoker    Packs/day: 1.00    Years: 20.00    Types: Cigarettes    Quit date: 04/11/1989  . Smokeless tobacco: Never Used  . Alcohol use No  . Drug use: No  . Sexual activity: No   Other Topics Concern  . Not on file   Social History Narrative  . No narrative on file    FAMILY HISTORY: Family History  Problem Relation Age of Onset  . Pancreatic cancer Father   . Diabetes Brother   . Heart disease Brother   . Colon cancer Maternal Grandmother    . Parkinson's disease Mother   . Lung cancer Brother     stage 4    ALLERGIES:  is allergic to dapsone and gluten meal.  MEDICATIONS:  Current Outpatient Prescriptions  Medication Sig Dispense Refill  . aspirin EC 81 MG tablet Take 81 mg by mouth daily.    . carvedilol (COREG) 3.125 MG tablet TAKE 1 TABLET BY MOUTH 2 (TWO) TIMES DAILY WITH MEALS. 180 tablet 1  . cholecalciferol (VITAMIN D) 1000 UNITS tablet Take 1,000 Units by mouth daily.    Marland Kitchen donepezil (ARICEPT) 5 MG tablet qam  11  . Multiple Vitamin (MULTIVITAMIN WITH MINERALS) TABS Take 1 tablet by mouth daily.    . rosuvastatin (CRESTOR) 20 MG tablet Take 20 mg by mouth every morning. Reported on 06/16/2015    . triamcinolone cream (KENALOG)  0.1 % Apply topically.    . white petrolatum (VASELINE) GEL Apply 1 application topically 2 (two) times daily. Applied to legs    . methylcellulose (ARTIFICIAL TEARS) 1 % ophthalmic solution Place 1-2 drops into both eyes daily as needed (dry eyes).     No current facility-administered medications for this visit.       Marland Kitchen  PHYSICAL EXAMINATION: ECOG PERFORMANCE STATUS: 0 - Asymptomatic  Vitals:   07/12/16 0832  BP: 130/73  Pulse: 66  Resp: 18  Temp: (!) 94.1 F (34.5 C)   Filed Weights   07/12/16 0832  Weight: 153 lb 7 oz (69.6 kg)     GENERAL: Well-nourished well-developed; Alert, no distress and comfortable.  Accompanied by his son. EYES: no pallor or icterus OROPHARYNX: no thrush or ulceration; good dentition  NECK: supple, no masses felt LYMPH:  no palpable lymphadenopathy in the cervical,or inguinal regions; LN excision noted  LUNGS: clear to auscultation and  No wheeze or crackles HEART/CVS: regular rate & rhythm and no murmurs; No lower extremity edema ABDOMEN: abdomen soft, non-tender and normal bowel sounds; positive for splenomegaly [4 fingers breath below costal margin] Musculoskeletal:no cyanosis of digits and no clubbing  PSYCH: alert & oriented x 3 with  fluent speech NEURO: no focal motor/sensory deficits SKIN:   rash present bilateral upper extremities- erythematous/central clearing. blanching.     LABORATORY DATA:  I have reviewed the data as listed Lab Results  Component Value Date   WBC 3.9 05/10/2016   HGB 11.7 (L) 05/10/2016   HCT 36.0 (L) 05/10/2016   MCV 83.9 05/10/2016   PLT 98 (L) 05/10/2016    Recent Labs  11/03/15 1012  NA 138  K 4.1  CL 103  CO2 30  GLUCOSE 112*  BUN 19  CREATININE 0.85  CALCIUM 8.8*  GFRNONAA >60  GFRAA >60      RADIOGRAPHIC STUDIES: I have personally reviewed the radiological images as listed and agreed with the findings in the report. No results found.  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  Discussed with pathologist Dr.Baker; and also reviewed at the tumor conference. Positive for myc gene re-arrangement-which confers high risk of diffuse large B-cell lymphoma.  # Discussed R CHOP based chemotherapy; however given history of ischemic cardiomyopathy- less likely to receive Adriamycin; recommend etoposide instead.   # Discussed the potential side effects including but not limited to-increasing fatigue, nausea vomiting, diarrhea, hair loss, sores in the mouth, increase risk of infection and also neuropathy.   # Skin rash- appears to be drug reaction question Aricept. Recommend discontinuation of Aricept at this time/informing patients neurologist. Recommend hydrocortisone and antihistamine. If it does not improve then recommend biopsy clinically less likely from lymphoma.  # Comorbidities-mild dementia/history of compensated ischemic cardiomyopathy/CAD history of stroke.  # Recommend MUGA scan; recommend chemotherapy education. Medi-port placement [sent message to  Dr.Sankar]  # Patient will likely to start chemotherapy in 2 weeks; discussed the issue with chemo schedule; labs- LDH.   # 40 minutes face-to-face with the patient discussing the above plan of care; more than 50% of time spent on prognosis/ natural history; counseling and coordination.     Cammie Sickle, MD 07/15/2016 6:44 PM

## 2016-07-12 NOTE — Progress Notes (Signed)
Here for follow up.noted to have mildly raised rash on arm and neck. No idea where he got this per pt and son

## 2016-07-12 NOTE — Assessment & Plan Note (Addendum)
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  Discussed with pathologist Dr.Baker; and also reviewed at the tumor conference. Positive for myc gene re-arrangement-which confers high risk of diffuse large B-cell lymphoma.  # Discussed R CHOP based chemotherapy; however given history of ischemic cardiomyopathy- less likely to receive Adriamycin; recommend etoposide instead.   # Discussed the potential side effects including but not limited to-increasing fatigue, nausea vomiting, diarrhea, hair loss, sores in the mouth, increase risk of infection and also neuropathy.   # Skin rash- appears to be drug reaction question Aricept. Recommend discontinuation of Aricept at this time/informing patients neurologist. Recommend hydrocortisone and antihistamine. If it does not improve then recommend biopsy clinically less likely from lymphoma.  # Comorbidities-mild dementia/history of compensated ischemic cardiomyopathy/CAD history of stroke.  # Recommend MUGA scan; recommend chemotherapy education. Medi-port placement [sent message to Dr.Sankar]  # Patient will likely to start chemotherapy in 2 weeks; discussed the issue with chemo schedule; labs- LDH.   # 40 minutes face-to-face with the patient discussing the above plan of care; more than 50% of time spent on prognosis/ natural history; counseling and coordination.

## 2016-07-13 ENCOUNTER — Encounter: Payer: Self-pay | Admitting: General Surgery

## 2016-07-13 ENCOUNTER — Telehealth: Payer: Self-pay | Admitting: General Surgery

## 2016-07-13 ENCOUNTER — Other Ambulatory Visit: Payer: Self-pay | Admitting: *Deleted

## 2016-07-13 ENCOUNTER — Telehealth: Payer: Self-pay | Admitting: *Deleted

## 2016-07-13 ENCOUNTER — Ambulatory Visit (INDEPENDENT_AMBULATORY_CARE_PROVIDER_SITE_OTHER): Payer: Medicare Other | Admitting: General Surgery

## 2016-07-13 VITALS — BP 114/60 | HR 74 | Resp 12 | Ht 72.0 in | Wt 153.0 lb

## 2016-07-13 DIAGNOSIS — T451X5A Adverse effect of antineoplastic and immunosuppressive drugs, initial encounter: Secondary | ICD-10-CM

## 2016-07-13 DIAGNOSIS — C8334 Diffuse large B-cell lymphoma, lymph nodes of axilla and upper limb: Secondary | ICD-10-CM

## 2016-07-13 DIAGNOSIS — Z95828 Presence of other vascular implants and grafts: Secondary | ICD-10-CM | POA: Insufficient documentation

## 2016-07-13 DIAGNOSIS — R112 Nausea with vomiting, unspecified: Secondary | ICD-10-CM | POA: Insufficient documentation

## 2016-07-13 NOTE — Telephone Encounter (Signed)
Surgery on 07-20-16 at [facility].

## 2016-07-13 NOTE — Telephone Encounter (Signed)
Message left for patient's son, [name], to call the office.   We need to discuss surgery plans and instructions for this patient.

## 2016-07-13 NOTE — Patient Instructions (Addendum)
The patient is aware to call back for any questions   Implanted [facility] Guide An implanted port is a type of central line that is placed under the skin. Central lines are used to provide IV access when treatment or nutrition needs to be given through a person's veins. Implanted ports are used for long-term IV access. An implanted port may be placed because:  You need IV medicine that would be irritating to the small veins in your hands or arms.  You need long-term IV medicines, such as antibiotics.  You need IV nutrition for a long period.  You need frequent blood draws for lab tests.  You need dialysis. Implanted ports are usually placed in the chest area, but they can also be placed in the upper arm, the abdomen, or the leg. An implanted port has two main parts:  Reservoir. The reservoir is round and will appear as a small, raised area under your skin. The reservoir is the part where a needle is inserted to give medicines or draw blood.  Catheter. The catheter is a thin, flexible tube that extends from the reservoir. The catheter is placed into a large vein. Medicine that is inserted into the reservoir goes into the catheter and then into the vein. How will I care for my incision site? Do not get the incision site wet. Bathe or shower as directed by your health care provider. How is my port accessed? Special steps must be taken to access the port:  Before the port is accessed, a numbing cream can be placed on the skin. This helps numb the skin over the port site.  Your health care provider uses a sterile technique to access the port.  Your health care provider must put on a mask and sterile gloves.  The skin over your port is cleaned carefully with an antiseptic and allowed to dry.  The port is gently pinched between sterile gloves, and a needle is inserted into the port.  Only "non-coring" port needles should be used to access the port. Once the port is accessed, a blood return  should be checked. This helps ensure that the port is in the vein and is not clogged.  If your port needs to remain accessed for a constant infusion, a clear (transparent) bandage will be placed over the needle site. The bandage and needle will need to be changed every week, or as directed by your health care provider.  Keep the bandage covering the needle clean and dry. Do not get it wet. Follow your health care provider's instructions on how to take a shower or bath while the port is accessed.  If your port does not need to stay accessed, no bandage is needed over the port. What is flushing? Flushing helps keep the port from getting clogged. Follow your health care provider's instructions on how and when to flush the port. Ports are usually flushed with saline solution or a medicine called heparin. The need for flushing will depend on how the port is used.  If the port is used for intermittent medicines or blood draws, the port will need to be flushed:  After medicines have been given.  After blood has been drawn.  As part of routine maintenance.  If a constant infusion is running, the port may not need to be flushed. How long will my port stay implanted? The port can stay in for as long as your health care provider thinks it is needed. When it is time for  the port to come out, surgery will be done to remove it. The procedure is similar to the one performed when the port was put in. When should I seek immediate medical care? When you have an implanted port, you should seek immediate medical care if:  You notice a bad smell coming from the incision site.  You have swelling, redness, or drainage at the incision site.  You have more swelling or pain at the port site or the surrounding area.  You have a fever that is not controlled with medicine. This information is not intended to replace advice given to you by your health care provider. Make sure you discuss any questions you have with  your health care provider. Document Released: 03/28/2005 Document Revised: 09/03/2015 Document Reviewed: 12/03/2012 Elsevier Interactive Patient Education  2017 [employer].

## 2016-07-13 NOTE — Telephone Encounter (Signed)
L/M FOR SON [name] TO CALL BACK TO RESCHEDULE PATIENTS APPOINTMENT.DR Jamal Collin SAID HE WOULD SEE HIM NEXT WEEK.

## 2016-07-13 NOTE — Progress Notes (Signed)
Patient ID: Gary Ferrell, male   DOB: [birth year], 81 y.o.   MRN: [id number]  Chief Complaint  Patient presents with  . Follow-up    HPI Gary Ferrell is a 81 y.o. male.  Here today for follow up and discuss port placement for treatment of B cell lymphoma. Pt is aware he needs a port but was not clear on his diagnosis.   I have reviewed the history of present illness with the patient.   HPI  Past Medical History:  Diagnosis Date  . AICD (automatic cardioverter/defibrillator) present   . BPH (benign prostatic hyperplasia)    a. 06/2012 s/p Greenlight Laser Prostatectomy.  . Carotid disease, bilateral ([place])    a. 1990's s/p L CEA;  b. 11/2004 s/p redo L CEA;  c. 06/2013 Carotid U/S: RICA 02-63%, LICA patent.  . Chronic systolic CHF (congestive heart failure) ([place])    a. 04/2013 Echo: EF 35%, inf DK, inflat AK, diast dysfxn, RV HK, mild MR, nmildly dil LA.  . CKD (chronic kidney disease), stage II   . Coronary artery disease    a. 2007 s/p MI & CABG.  . Diverticulosis    a. 10/2013 colonoscopy (otw nl study).  Marland Kitchen Dyspnea   . GERD (gastroesophageal reflux disease)   . Hyperlipidemia   . Hypertension   . Ischemic cardiomyopathy    a. 04/2013 Echo: EF 35%;  b. 06/2013 s/p SJM Bi-V ICD, ser # [id number].  . Large B-cell lymphoma ([place]) 06/16/2016  . LBBB (left bundle branch block)   . Lung nodules   . Myocardial infarction   . OSA (obstructive sleep apnea)    a. 12/2013 Sleep study: severe OSA - CPAP-   . PAD (peripheral artery disease) ([place])    a. 1990's s/p Aortobifem bypass.  . Presence of permanent cardiac pacemaker   . Spinal stenosis   . Stricture of urethral meatus    a. 08/2009 s/p dil.  . Stroke [facility])    a. 2007 Periop CABG - no residual.    Past Surgical History:  Procedure Laterality Date  . AORTA - BILATERAL FEMORAL ARTERY BYPASS GRAFT  1992  . APPENDECTOMY  1964  . AXILLARY LYMPH NODE BIOPSY Left 06/16/2016   LARGE B-CELL LYMPHOMA/ AXILLARY LYMPH NODE BIOPSY;  Surgeon:  Christene Lye, MD;  Location: [facility] ORS;  Service: General;  Laterality: Left;  . BI-VENTRICULAR IMPLANTABLE CARDIOVERTER DEFIBRILLATOR N/A 06/25/2013   Procedure: BI-VENTRICULAR IMPLANTABLE CARDIOVERTER DEFIBRILLATOR  (CRT-D);  Surgeon: Evans Lance, MD;  Location: [facility] CATH LAB;  Service: Cardiovascular;  Laterality: N/A;  . BI-VENTRICULAR IMPLANTABLE CARDIOVERTER DEFIBRILLATOR  (CRT-D)  06/25/2013  . BI-VENTRICULAR IMPLANTABLE CARDIOVERTER DEFIBRILLATOR  (CRT-D)  06/25/13   STJ CRTD implanted by Dr Lovena Le  . CARDIAC CATHETERIZATION  2007; 2012   "unsuccessful attempt to put stents in in 2007, had MI, had to have CABG;"  . CAROTID ENDARTERECTOMY Left 1992; 2006  . CHOLECYSTECTOMY  ~ 1996  . COLON SURGERY  2007   herniated intestinal obstruction  . CORONARY ARTERY BYPASS GRAFT  2007   "CABG X3" (06/25/2013)  . GREEN LIGHT LASER TURP (TRANSURETHRAL RESECTION OF PROSTATE N/A 07/02/2012   Procedure: GREEN LIGHT LASER TURP (TRANSURETHRAL RESECTION OF PROSTATE;  Surgeon: Ailene Rud, MD;  Location: [facility] ORS;  Service: Urology;  Laterality: N/A;  . SKIN GRAFT  2008   "area of colon surgery wouldn't heal; had this graft over area"  . TONSILLECTOMY    . URETHRAL MEATOPLASTY  08/2009  .  URETHRAL STRICTURE DILATATION  02/2010; 09/2010  . VENTRAL HERNIA REPAIR  2008   ventral epigastric hernia repair [Other]    Family History  Problem Relation Age of Onset  . Pancreatic cancer Father   . Diabetes Brother   . Heart disease Brother   . Colon cancer Maternal Grandmother   . Parkinson's disease Mother   . Lung cancer Brother     stage 4    Social History Social History  Substance Use Topics  . Smoking status: Former Smoker    Packs/day: 1.00    Years: 20.00    Types: Cigarettes    Quit date: 04/11/1989  . Smokeless tobacco: Never Used  . Alcohol use No    Allergies  Allergen Reactions  . Dapsone Other (See Comments)    REACTION: caused neuropathy/ affected walking  patient  stated he received high dose of dapsone which caused the neuropathy   . Gluten Meal Dermatitis    Dermatitis     Current Outpatient Prescriptions  Medication Sig Dispense Refill  . aspirin EC 81 MG tablet Take 81 mg by mouth daily.    . carvedilol (COREG) 3.125 MG tablet TAKE 1 TABLET BY MOUTH 2 (TWO) TIMES DAILY WITH MEALS. 180 tablet 1  . cholecalciferol (VITAMIN D) 1000 UNITS tablet Take 1,000 Units by mouth daily.    Marland Kitchen donepezil (ARICEPT) 5 MG tablet qam  11  . methylcellulose (ARTIFICIAL TEARS) 1 % ophthalmic solution Place 1-2 drops into both eyes daily as needed (dry eyes).    . Multiple Vitamin (MULTIVITAMIN WITH MINERALS) TABS Take 1 tablet by mouth daily.    . rosuvastatin (CRESTOR) 20 MG tablet Take 20 mg by mouth at bedtime. Reported on 06/16/2015    . triamcinolone cream (KENALOG) 0.1 % Apply topically.    . white petrolatum (VASELINE) GEL Apply 1 application topically 2 (two) times daily. Applied to legs     No current facility-administered medications for this visit.     Review of Systems Review of Systems  Constitutional: Negative.   Respiratory: Negative.   Cardiovascular: Negative.     Blood pressure 114/60, pulse 74, resp. rate 12, height 6' (1.829 m), weight 153 lb (69.4 kg).  Physical Exam Physical Exam  Constitutional: He is oriented to person, place, and time. He appears well-developed and well-nourished.  HENT:  Mouth/Throat: Oropharynx is clear and moist.  Eyes: Conjunctivae are normal. No scleral icterus.  Neck: Neck supple.  Cardiovascular: Normal rate, regular rhythm and normal heart sounds.   Pulmonary/Chest: Effort normal and breath sounds normal.  Implanted cardioverter defibrillator present left chest   Lymphadenopathy:    He has no cervical adenopathy.  Left axillary incision clean  Neurological: He is alert and oriented to person, place, and time.  Skin: Skin is warm and dry.  Psychiatric: His behavior is normal.    Data Reviewed Prior  notes  Assessment    Large B cell lymphoma.      Plan   Pt advised on port placement.  The risks associated with central venous access including arterial, pulmonary and venous injury were reviewed. The possible need for additional treatment if pulmonary injury occurs (chest tube placement) was discussed.      This information has been scribed by Karie Fetch RN, BSN,BC.   [place] Severs G 07/13/2016, 3:58 PM

## 2016-07-13 NOTE — Telephone Encounter (Signed)
Patient's son called back and surgery plans and instructions were reviewed. He verbalizes understanding and was instructed to call the office back if he has further questions.

## 2016-07-14 ENCOUNTER — Inpatient Hospital Stay: Payer: Medicare Other

## 2016-07-15 ENCOUNTER — Ambulatory Visit
Admission: RE | Admit: 2016-07-15 | Discharge: 2016-07-15 | Disposition: A | Discharge status: Home / Self Care | Payer: Medicare Other | Source: Ambulatory Visit | Attending: Internal Medicine | Admitting: Internal Medicine

## 2016-07-15 ENCOUNTER — Encounter
Admission: RE | Admit: 2016-07-15 | Discharge: 2016-07-15 | Disposition: A | Discharge status: Home / Self Care | Payer: Medicare Other | Source: Ambulatory Visit | Attending: General Surgery | Admitting: General Surgery

## 2016-07-15 DIAGNOSIS — C8510 Unspecified B-cell lymphoma, unspecified site: Secondary | ICD-10-CM | POA: Insufficient documentation

## 2016-07-15 DIAGNOSIS — Z5181 Encounter for therapeutic drug level monitoring: Secondary | ICD-10-CM | POA: Insufficient documentation

## 2016-07-15 DIAGNOSIS — Z79899 Other long term (current) drug therapy: Secondary | ICD-10-CM | POA: Insufficient documentation

## 2016-07-15 DIAGNOSIS — C8584 Other specified types of non-Hodgkin lymphoma, lymph nodes of axilla and upper limb: Secondary | ICD-10-CM

## 2016-07-15 MED ORDER — TECHNETIUM TC 99M-LABELED RED BLOOD CELLS IV KIT
20.0000 | PACK | Freq: Once | INTRAVENOUS | Status: AC | PRN
  Administered 2016-07-15: 22.1 via INTRAVENOUS

## 2016-07-15 NOTE — Patient Instructions (Signed)
  Your procedure is scheduled on: 07-20-16 (Wednesday) Report to Same Day Surgery 2nd floor medical mall [facility] Entrance-take elevator on left to 2nd floor.  Check in with surgery information desk.) To find out your arrival time please call [phone] between 1PM - 3PM on 07-19-16 (Tuesday)  Remember: Instructions that are not followed completely may result in serious medical risk, up to and including death, or upon the discretion of your surgeon and anesthesiologist your surgery may need to be rescheduled.    _x___ 1. Do not eat food or drink liquids after midnight. No gum chewing or hard candies.     __x__ 2. No Alcohol for 24 hours before or after surgery.   __x__3. No Smoking for 24 prior to surgery.   ____  4. Bring all medications with you on the day of surgery if instructed.    __x__ 5. Notify your doctor if there is any change in your medical condition     (cold, fever, infections).     Do not wear jewelry, make-up, hairpins, clips or nail polish.  Do not wear lotions, powders, or perfumes. You may wear deodorant.  Do not shave 48 hours prior to surgery. Men may shave face and neck.  Do not bring valuables to the hospital.    [facility] is not responsible for any belongings or valuables.               Contacts, dentures or bridgework may not be worn into surgery.  Leave your suitcase in the car. After surgery it may be brought to your room.  For patients admitted to the hospital, discharge time is determined by your treatment team.   Patients discharged the day of surgery will not be allowed to drive home.  You will need someone to drive you home and stay with you the night of your procedure.    Please read over the following fact sheets that you were given:   [facility] Preparing for Surgery and or MRSA Information   _x___ Take anti-hypertensive (unless it includes a diuretic), cardiac, seizure, asthma, anti-reflux and psychiatric medicines. These include:  1.  CARVEDILOL (COREG)  2. ROSUVASTATIN (CRESTOR)  3.  4.  5.  6.  ____Fleets enema or Magnesium Citrate as directed.   _x___ Use CHG Soap or sage wipes as directed on instruction sheet   ____ Use inhalers on the day of surgery and bring to hospital day of surgery  ____ Stop Metformin and Janumet 2 days prior to surgery.    ____ Take 1/2 of usual insulin dose the night before surgery and none on the morning     surgery.   ____ Follow recommendations from Cardiologist, Pulmonologist or PCP regarding stopping Aspirin, Coumadin, Pllavix ,Eliquis, Effient, or Pradaxa, and Pletal.-OK TO CONTINUE 81 MG ASPIRIN-DO NOT TAKE MORNING OF SURGERY  X____Stop Anti-inflammatories such as Advil, Aleve, Ibuprofen, Motrin, Naproxen, Naprosyn, Goodies powders or aspirin products. OK to take Tylenol   ____ Stop supplements until after surgery.    ____ Bring C-Pap to the hospital.

## 2016-07-18 ENCOUNTER — Encounter
Admission: RE | Admit: 2016-07-18 | Discharge: 2016-07-18 | Disposition: A | Discharge status: Home / Self Care | Payer: Medicare Other | Source: Ambulatory Visit | Attending: General Surgery | Admitting: General Surgery

## 2016-07-18 DIAGNOSIS — C8330 Diffuse large B-cell lymphoma, unspecified site: Secondary | ICD-10-CM | POA: Diagnosis not present

## 2016-07-18 DIAGNOSIS — G4733 Obstructive sleep apnea (adult) (pediatric): Secondary | ICD-10-CM | POA: Diagnosis not present

## 2016-07-18 DIAGNOSIS — I130 Hypertensive heart and chronic kidney disease with heart failure and stage 1 through stage 4 chronic kidney disease, or unspecified chronic kidney disease: Secondary | ICD-10-CM | POA: Diagnosis not present

## 2016-07-18 DIAGNOSIS — Z79899 Other long term (current) drug therapy: Secondary | ICD-10-CM | POA: Diagnosis not present

## 2016-07-18 DIAGNOSIS — E785 Hyperlipidemia, unspecified: Secondary | ICD-10-CM | POA: Diagnosis not present

## 2016-07-18 DIAGNOSIS — I255 Ischemic cardiomyopathy: Secondary | ICD-10-CM | POA: Diagnosis not present

## 2016-07-18 DIAGNOSIS — Z8673 Personal history of transient ischemic attack (TIA), and cerebral infarction without residual deficits: Secondary | ICD-10-CM | POA: Diagnosis not present

## 2016-07-18 DIAGNOSIS — I5022 Chronic systolic (congestive) heart failure: Secondary | ICD-10-CM | POA: Diagnosis not present

## 2016-07-18 DIAGNOSIS — I2510 Atherosclerotic heart disease of native coronary artery without angina pectoris: Secondary | ICD-10-CM | POA: Diagnosis not present

## 2016-07-18 DIAGNOSIS — Z87891 Personal history of nicotine dependence: Secondary | ICD-10-CM | POA: Diagnosis not present

## 2016-07-18 DIAGNOSIS — Z95810 Presence of automatic (implantable) cardiac defibrillator: Secondary | ICD-10-CM | POA: Diagnosis not present

## 2016-07-18 DIAGNOSIS — Z95820 Peripheral vascular angioplasty status with implants and grafts: Secondary | ICD-10-CM | POA: Diagnosis not present

## 2016-07-18 DIAGNOSIS — I252 Old myocardial infarction: Secondary | ICD-10-CM | POA: Diagnosis not present

## 2016-07-18 DIAGNOSIS — Z951 Presence of aortocoronary bypass graft: Secondary | ICD-10-CM | POA: Diagnosis not present

## 2016-07-18 DIAGNOSIS — Z7982 Long term (current) use of aspirin: Secondary | ICD-10-CM | POA: Diagnosis not present

## 2016-07-18 DIAGNOSIS — C8590 Non-Hodgkin lymphoma, unspecified, unspecified site: Secondary | ICD-10-CM | POA: Diagnosis present

## 2016-07-18 DIAGNOSIS — N182 Chronic kidney disease, stage 2 (mild): Secondary | ICD-10-CM | POA: Diagnosis not present

## 2016-07-18 HISTORY — DX: Unspecified dementia, unspecified severity, without behavioral disturbance, psychotic disturbance, mood disturbance, and anxiety: F03.90

## 2016-07-19 ENCOUNTER — Inpatient Hospital Stay: Payer: Medicare Other

## 2016-07-19 MED ORDER — FAMOTIDINE 20 MG PO TABS
20.0000 mg | ORAL_TABLET | Freq: Once | ORAL | Status: DC
Start: 2016-07-19 — End: 2016-07-20

## 2016-07-19 MED ORDER — CEFAZOLIN SODIUM-DEXTROSE 2-4 GM/100ML-% IV SOLN
2.0000 g | INTRAVENOUS | Status: AC
  Administered 2016-07-20: 2 g via INTRAVENOUS

## 2016-07-19 NOTE — Pre-Procedure Instructions (Signed)
SPOKE WITH DR DONAHUE'S OFFICE ABOUT PACEMAKER-DEFIBRILLATOR FORM THAT I FAXED ON Friday THAT NEEDS TO BE FILLED OUT FOR PTS SURGERY TOMORROW-MARTY IS IN THE PROCESS OF GETTING FORM FILLED OUT AND WILL FAX WHEN COMPLETED

## 2016-07-19 NOTE — Pre-Procedure Instructions (Addendum)
CALLED DR ADAMS REGARDING EKG THAT WAS DONE THAT IS NOW SHOWING "T WAVE INVERSION MORE EVIDENT IN INFERIOR LEADS" PER MUSE- DR ADAMS INFORMED THAT CARDIOLOGIST HAS NOT READ EKG YET- DR ADAMS SAID PORT PLACEMENT WILL BE DONE UNDER MAC AND OK TO PROCEED

## 2016-07-19 NOTE — Pre-Procedure Instructions (Signed)
NM myocardial perfusion SPECT multiple (stress and rest)07/16/2015 [place] Component Name Value Ref Range  LV Ejection Fraction (%) 48 %  Result Narrative      Myocardial Perfusion  Report    Gary Ferrell, Gary Ferrell Exam Date: 07/16/2015 11:15 Ordering Phys: Gregary Signs  [facility]  MRN: [id number] Gender: M Exam Location: [place] Referring Phys: ROOM, EMERGENCY  Age: 81 DOB: March 01, 1936 Ht (cm): 183 Wt (kg): 78 BMI: 23.19 Technologist: Georganna Skeans, CNMT  Procedure CPT: 979 [id number]   History and Risk Factors:  Patient History: CEU E05 patient- p/w CP after doing yardwork, also with recent episode while at rest, MI r/o by cardiac  biomarkers x 3,  h/o anemia, OSA, s/p A-F bypass, CAD, s/p CVA s/p CEA x 2, s/p ICD, LBBB,  Cardiac History: s/p MI and CABG x 3V (2007)  LBBB, 1st-degree AVB and PVC's 07/2015  Transmural infarct of basal Inf and IL walls on Lexiscan MPI 01/21/2015  Hypertension. Smoking. Hyperlipidemia. Cerebral Vascular Disease. Family History. Peripheral Vascular  Disease. History of MI.   Procedure Date   Lexiscan MPI 01/21/2015   ECG 07/16/2015   Cardiac Echo 04/2013   CABG 2007   MI 2007    Cardiac Meds: Carvedilol. Aspirin. Crestor. Enalapril.  Meds past 24 Carvedilol. Aspirin. Crestor. Enalapril. GI cocktail.  hrs:  Pretest Chest Pain: Atypical angina  Hours NPO: >8 Hours without caffeine: >12   Comparison to prior studies:  No Prior Studies For Comparison.    Image Analysis:    Rest Stress Conclusion  Basal Anterior Normal Normal Normal  Basal Ant Septal Normal Normal Normal  Basal Inf Septal Moderate Moderate Infarct  Basal Inferior Severe Severe Infarct  Basal Inf Lat Severe Severe Infarct  Basal Ant Lat Normal Normal Normal  Mid Anterior Normal Normal Normal  Mid Ant Septal Normal Normal Normal  Mid Inf Septal Mild Mild Infarct  Mid Inferior Severe Severe Infarct  Mid Inf Lateral Moderate Moderate Infarct  Mid Ant  Lateral Normal Normal Normal  Apical Anterior Normal Normal Normal  Apical Septal Normal Normal Normal  Apical Inferior Normal Normal Normal  Apical Lateral Normal Normal Normal  Apex Normal Normal Normal  SPECT RESULTS  Technical Quality: Good   PERFUSION: No evidence of ischemia.    STRESS TEST Pharmacologic  Protocol: Regadenoson Dose: 0.4 mg  Stage Dose Time (min) BP (mmHg) HR (bpm) Comments   STANDING01:20 139 / 61 57  DOSE 1 0.4 01:0055  RECOV 101:00 129 / 53 76  RECOV 201:0074 PAC, PVC   RECOV 301:00 128 / 59 73  RECOV 401:47 129/55 68   Resting HR (bpm): 57 Resting BP (mmHg): 139 / 61 MaxPHR: 143 Target HR (bpm): 122  Peak HR (bpm): 78 Peak BP (mmHg): 129 / 53 % MaxPHR: 55 Double Product: 10062  BP Response: Normal blood pressure response during stress  Stress Termination: Planned  Stress Symptoms: No CP reported.    ECG Uninterpretable  ECG Interpretation: ECG is uninterpretable due to resting conduction disturbance.   IMAGE PROTOCOL Rest/Stress 1 Day Modality: GE 530c  Radiopharmaceutical Dose (mCi) ImagingDate Inj Time Img Time Rescan Reason Rescan Time  Rest: Tc-6m Tetrofosmin, 5.5 07/16/2015 1110 1149   Stress: Tc-25m Tetrofosmin, 16.2 07/16/2015 1245 1321   Rest Administration Site: Left Forearm Tech Administering Rest Dose: Jana Hakim, CNMT  Stress Administration Site: Left Forearm Tech Administering Stress Dose: Lavada Mesi, CNMT   FUNCTIONAL RESULTS (calculated via Gated SPECT)  Post Stress Image LV EF: 48 %  Stress EDV: 141 ml EDVI: 71 ml/m TID:  Stress ESV: 73 ml ESVI: 37 ml/m  Wall Motion Wall Thickening  Anterior: Normal Normal  Apex: Normal Normal  Inferior: Severe - Hypokinetic Abnormal  Lateral: Mild - Hypokinetic Abnormal  Septal: Mild - Hypokinetic Normal   LV Ejection Fraction& Size: Reduced left ventricular ejection fraction.  Dilated left ventricle.    LV Function & Wall Thickening: Moderately  reduced left ventricular systolic function.   FINAL COMMENTS    No evidence of Regadenoson inducible myocardial ischemia. Findings in the inferior, inferolateral and inferoseptal areas are  compatible with prior infarction.    Electronically Reviewed by: Cornelia Copa MD  (Electronically Signed) Fellow: Vela Prose, MD  Final Date: 16 July 2015 14:50  Status Results Details    Hospital Encounter on 07/15/2015 [place]")' [email or web address]

## 2016-07-20 ENCOUNTER — Ambulatory Visit: Payer: Medicare Other

## 2016-07-20 ENCOUNTER — Ambulatory Visit: Payer: Medicare Other | Admitting: Anesthesiology

## 2016-07-20 ENCOUNTER — Encounter
Admission: RE | Disposition: A | Discharge status: Home / Self Care | Payer: Self-pay | Source: Ambulatory Visit | Attending: General Surgery

## 2016-07-20 ENCOUNTER — Encounter: Payer: Self-pay | Admitting: *Deleted

## 2016-07-20 ENCOUNTER — Ambulatory Visit
Admission: RE | Admit: 2016-07-20 | Discharge: 2016-07-20 | Disposition: A | Discharge status: Home / Self Care | Payer: Medicare Other | Source: Ambulatory Visit | Attending: General Surgery | Admitting: General Surgery

## 2016-07-20 DIAGNOSIS — I130 Hypertensive heart and chronic kidney disease with heart failure and stage 1 through stage 4 chronic kidney disease, or unspecified chronic kidney disease: Secondary | ICD-10-CM | POA: Insufficient documentation

## 2016-07-20 DIAGNOSIS — G4733 Obstructive sleep apnea (adult) (pediatric): Secondary | ICD-10-CM | POA: Insufficient documentation

## 2016-07-20 DIAGNOSIS — Z8673 Personal history of transient ischemic attack (TIA), and cerebral infarction without residual deficits: Secondary | ICD-10-CM | POA: Insufficient documentation

## 2016-07-20 DIAGNOSIS — Z951 Presence of aortocoronary bypass graft: Secondary | ICD-10-CM | POA: Insufficient documentation

## 2016-07-20 DIAGNOSIS — Z79899 Other long term (current) drug therapy: Secondary | ICD-10-CM | POA: Insufficient documentation

## 2016-07-20 DIAGNOSIS — N182 Chronic kidney disease, stage 2 (mild): Secondary | ICD-10-CM | POA: Insufficient documentation

## 2016-07-20 DIAGNOSIS — I255 Ischemic cardiomyopathy: Secondary | ICD-10-CM | POA: Insufficient documentation

## 2016-07-20 DIAGNOSIS — C8334 Diffuse large B-cell lymphoma, lymph nodes of axilla and upper limb: Secondary | ICD-10-CM | POA: Insufficient documentation

## 2016-07-20 DIAGNOSIS — E785 Hyperlipidemia, unspecified: Secondary | ICD-10-CM | POA: Insufficient documentation

## 2016-07-20 DIAGNOSIS — Z7982 Long term (current) use of aspirin: Secondary | ICD-10-CM | POA: Insufficient documentation

## 2016-07-20 DIAGNOSIS — Z95828 Presence of other vascular implants and grafts: Secondary | ICD-10-CM

## 2016-07-20 DIAGNOSIS — I252 Old myocardial infarction: Secondary | ICD-10-CM | POA: Insufficient documentation

## 2016-07-20 DIAGNOSIS — Z87891 Personal history of nicotine dependence: Secondary | ICD-10-CM | POA: Insufficient documentation

## 2016-07-20 DIAGNOSIS — Z95820 Peripheral vascular angioplasty status with implants and grafts: Secondary | ICD-10-CM | POA: Insufficient documentation

## 2016-07-20 DIAGNOSIS — I2510 Atherosclerotic heart disease of native coronary artery without angina pectoris: Secondary | ICD-10-CM | POA: Insufficient documentation

## 2016-07-20 DIAGNOSIS — C8330 Diffuse large B-cell lymphoma, unspecified site: Secondary | ICD-10-CM | POA: Diagnosis not present

## 2016-07-20 DIAGNOSIS — I5022 Chronic systolic (congestive) heart failure: Secondary | ICD-10-CM | POA: Insufficient documentation

## 2016-07-20 DIAGNOSIS — Z95810 Presence of automatic (implantable) cardiac defibrillator: Secondary | ICD-10-CM | POA: Insufficient documentation

## 2016-07-20 HISTORY — PX: PORTACATH PLACEMENT: SHX2246

## 2016-07-20 SURGERY — INSERTION, TUNNELED CENTRAL VENOUS DEVICE, WITH PORT
Anesthesia: General | Wound class: Clean

## 2016-07-20 MED ORDER — LIDOCAINE HCL 1 % IJ SOLN
INTRAMUSCULAR | Status: DC | PRN
  Administered 2016-07-20: 15 mL

## 2016-07-20 MED ORDER — HEPARIN SODIUM (PORCINE) 5000 UNIT/ML IJ SOLN
INTRAMUSCULAR | Status: AC
  Filled 2016-07-20: qty 1

## 2016-07-20 MED ORDER — ONDANSETRON HCL 4 MG/2ML IJ SOLN: 4.0000 mg | Freq: Once | INTRAMUSCULAR | Status: DC | PRN

## 2016-07-20 MED ORDER — PROPOFOL 10 MG/ML IV BOLUS
INTRAVENOUS | Status: DC | PRN
  Administered 2016-07-20: 30 mg via INTRAVENOUS

## 2016-07-20 MED ORDER — CHLORHEXIDINE GLUCONATE CLOTH 2 % EX PADS: 6.0000 | MEDICATED_PAD | Freq: Once | CUTANEOUS | Status: DC

## 2016-07-20 MED ORDER — LIDOCAINE HCL (PF) 1 % IJ SOLN
INTRAMUSCULAR | Status: AC
  Filled 2016-07-20: qty 30

## 2016-07-20 MED ORDER — SODIUM CHLORIDE 0.9 % IV SOLN
INTRAVENOUS | Status: DC | PRN
  Administered 2016-07-20: 14:00:00 16 mL via INTRAMUSCULAR

## 2016-07-20 MED ORDER — LACTATED RINGERS IV SOLN
INTRAVENOUS | Status: DC
  Administered 2016-07-20: 12:00:00 via INTRAVENOUS

## 2016-07-20 MED ORDER — PROPOFOL 10 MG/ML IV BOLUS
INTRAVENOUS | Status: AC
  Filled 2016-07-20: qty 20

## 2016-07-20 MED ORDER — FENTANYL CITRATE (PF) 100 MCG/2ML IJ SOLN
INTRAMUSCULAR | Status: DC | PRN
  Administered 2016-07-20: 25 ug via INTRAVENOUS

## 2016-07-20 MED ORDER — FENTANYL CITRATE (PF) 100 MCG/2ML IJ SOLN: 25.0000 ug | INTRAMUSCULAR | Status: DC | PRN

## 2016-07-20 MED ORDER — FENTANYL CITRATE (PF) 100 MCG/2ML IJ SOLN
INTRAMUSCULAR | Status: AC
  Filled 2016-07-20: qty 2

## 2016-07-20 MED ORDER — CEFAZOLIN SODIUM-DEXTROSE 2-4 GM/100ML-% IV SOLN
INTRAVENOUS | Status: AC
  Administered 2016-07-20: 2 g via INTRAVENOUS
  Filled 2016-07-20: qty 100

## 2016-07-20 MED ORDER — PROPOFOL 500 MG/50ML IV EMUL
INTRAVENOUS | Status: DC | PRN
  Administered 2016-07-20: 50 ug/kg/min via INTRAVENOUS

## 2016-07-20 MED ORDER — BUPIVACAINE HCL (PF) 0.5 % IJ SOLN
INTRAMUSCULAR | Status: AC
  Filled 2016-07-20: qty 30

## 2016-07-20 SURGICAL SUPPLY — 32 items
ADH SKN CLS APL DERMABOND .7 (GAUZE/BANDAGES/DRESSINGS) ×1
BAG DECANTER FOR FLEXI CONT (MISCELLANEOUS) ×3 IMPLANT
BLADE SURG 15 STRL SS SAFETY (BLADE) ×3 IMPLANT
CANISTER SUCT 1200ML W/VALVE (MISCELLANEOUS) ×3 IMPLANT
CHLORAPREP W/TINT 26ML (MISCELLANEOUS) ×3 IMPLANT
COVER LIGHT HANDLE STERIS (MISCELLANEOUS) ×6 IMPLANT
DECANTER SPIKE VIAL GLASS SM (MISCELLANEOUS) ×6 IMPLANT
DERMABOND ADVANCED (GAUZE/BANDAGES/DRESSINGS) ×2
DERMABOND ADVANCED .7 DNX12 (GAUZE/BANDAGES/DRESSINGS) ×1 IMPLANT
DRAPE C-ARM XRAY 36X54 (DRAPES) ×3 IMPLANT
ELECT REM PT RETURN 9FT ADLT (ELECTROSURGICAL) ×3
ELECTRODE REM PT RTRN 9FT ADLT (ELECTROSURGICAL) ×1 IMPLANT
GLOVE BIO SURGEON STRL SZ7 (GLOVE) ×13 IMPLANT
GOWN STRL REUS W/ TWL LRG LVL3 (GOWN DISPOSABLE) ×2 IMPLANT
GOWN STRL REUS W/TWL LRG LVL3 (GOWN DISPOSABLE) ×9
IV NS 500ML (IV SOLUTION) ×3
IV NS 500ML BAXH (IV SOLUTION) ×1 IMPLANT
KIT PORT POWER 8FR ISP CVUE (Catheter) ×3 IMPLANT
KIT RM TURNOVER STRD PROC AR (KITS) ×3 IMPLANT
LABEL OR SOLS (LABEL) ×3 IMPLANT
NDL FILTER BLUNT 18X1 1/2 (NEEDLE) ×1 IMPLANT
NDL HYPO 25GX1X1/2 BEV (NEEDLE) ×1 IMPLANT
NEEDLE FILTER BLUNT 18X 1/2SAF (NEEDLE) ×2
NEEDLE FILTER BLUNT 18X1 1/2 (NEEDLE) ×1 IMPLANT
NEEDLE HYPO 25GX1X1/2 BEV (NEEDLE) ×3 IMPLANT
NS IRRIG 500ML POUR BTL (IV SOLUTION) ×3 IMPLANT
PACK PORT-A-CATH (MISCELLANEOUS) ×3 IMPLANT
SUT PROLENE 2 0 SH DA (SUTURE) ×5 IMPLANT
SUT VIC AB 3-0 SH 27 (SUTURE) ×3
SUT VIC AB 3-0 SH 27X BRD (SUTURE) ×1 IMPLANT
SUT VIC AB 4-0 FS2 27 (SUTURE) ×3 IMPLANT
SYR 3ML LL SCALE MARK (SYRINGE) ×3 IMPLANT

## 2016-07-20 NOTE — Anesthesia Preprocedure Evaluation (Signed)
Anesthesia Evaluation  Patient identified by MRN, date of birth, ID band Patient awake    Reviewed: Allergy & Precautions, H&P , NPO status , Patient's Chart, lab work & pertinent test results, reviewed documented beta blocker date and time   History of Anesthesia Complications Negative for: history of anesthetic complications  Airway Mallampati: II  TM Distance: >3 FB Neck ROM: full    Dental no notable dental hx. (+) Teeth Intact   Pulmonary shortness of breath, sleep apnea , neg COPD, neg recent URI, former smoker,           Cardiovascular Exercise Tolerance: Good hypertension, (-) angina+ CAD, + Past MI, + CABG, + Peripheral Vascular Disease and +CHF  (-) Cardiac Stents negative cardio ROS  + dysrhythmias + pacemaker + Cardiac Defibrillator (-) Valvular Problems/Murmurs     Neuro/Psych PSYCHIATRIC DISORDERS (Dementia) CVA, Residual Symptoms negative neurological ROS     GI/Hepatic negative GI ROS, Neg liver ROS, GERD  Medicated,  Endo/Other  negative endocrine ROSdiabetes  Renal/GU Renal disease  negative genitourinary   Musculoskeletal   Abdominal   Peds  Hematology negative hematology ROS (+)   Anesthesia Other Findings Past Medical History: No date: AICD (automatic cardioverter/defibrillator) pr* No date: BPH (benign prostatic hyperplasia)     Comment: a. 06/2012 s/p Greenlight Laser Prostatectomy. No date: Carotid disease, bilateral ([place])     Comment: a. 1990's s/p L CEA;  b. 11/2004 s/p redo L               CEA;  c. 06/2013 Carotid U/S: RICA 29-93%, LICA               patent. No date: Chronic systolic CHF (congestive heart failure*     Comment: a. 04/2013 Echo: EF 35%, inf DK, inflat AK,               diast dysfxn, RV HK, mild MR, nmildly dil LA. No date: CKD (chronic kidney disease), stage II No date: Coronary artery disease     Comment: a. 2007 s/p MI & CABG. No date: Dementia No date:  Diverticulosis     Comment: a. 10/2013 colonoscopy (otw nl study). No date: Dyspnea No date: GERD (gastroesophageal reflux disease) No date: Hyperlipidemia No date: Hypertension No date: Ischemic cardiomyopathy     Comment: a. 04/2013 Echo: EF 35%;  b. 06/2013 s/p SJM               Bi-V ICD, ser # [id number]. 06/16/2016: Large B-cell lymphoma ([place]) No date: LBBB (left bundle branch block) No date: Lung nodules No date: Myocardial infarction No date: OSA (obstructive sleep apnea)     Comment: a. 12/2013 Sleep study: severe OSA - CPAP-  No date: PAD (peripheral artery disease) ([place])     Comment: a. 1990's s/p Aortobifem bypass. No date: Presence of permanent cardiac pacemaker No date: Spinal stenosis No date: Stricture of urethral meatus     Comment: a. 08/2009 s/p dil. No date: Stroke [facility])     Comment: a. 2007 Periop CABG - no residual.   Reproductive/Obstetrics negative OB ROS                             Anesthesia Physical  Anesthesia Plan  ASA: IV  Anesthesia Plan: General   Post-op Pain Management:    Induction:   Airway Management Planned:   Additional Equipment:   Intra-op Plan:   Post-operative Plan:   Informed  Consent: I have reviewed the patients History and Physical, chart, labs and discussed the procedure including the risks, benefits and alternatives for the proposed anesthesia with the patient or authorized representative who has indicated his/her understanding and acceptance.     Plan Discussed with: CRNA  Anesthesia Plan Comments:         Anesthesia Quick Evaluation

## 2016-07-20 NOTE — Op Note (Signed)
Preop diagnosis: Lymphoma  Post op diagnosis: Same  Operation: Insertion venous access port with ultrasound and fluoroscopy guidance  Surgeon: Mckinley Jewel  Assistant:     Anesthesia: Monitored anesthesia care  Complications: None  EBL: Less than 5 mL  Drains: None  Description: Patient was placed supine on the operating table and then placed in slight Trendelenburg position. With adequate sedation and monitoring the right upper chest and neck area were prepped and draped sterile field and timeout performed. Ultrasound probe was brought up to the field and the subclavian vein was identified beneath the lateral end of the clavicle. After instillation of local anesthetic containing half percent Marcaine mixed with 1% Xylocaine small stab incision was made and a needle was successfully positioned in the subclavian vein under ultrasound guidance. Seldinger technique was then utilized to place a catheter going towards the distal SVC. Fluoroscopy was used to position this properly. Since the at the skin entrance level was 20 cm. Local anesthetic was instilled over the second interspace for placement of the port. This 2 cm incision was then made and subcutaneous pocket was created with the use of cautery. The catheter was then tunneled through to the port site and cut to approximate length. The catheter was then fixed to the prefilled port. Port was then placed in the pocket and anchored with 3 stitches of 2-0 Prolene the underlying fascia. The port was flushed through with heparinized saline. Fluoroscopy was repeated to ensure a smooth course of the catheter and the distal end being in the distal SVC. Subcutaneous tissue closed with 3-0 Vicryl. Skin closed with subcuticular 4-0 Vicryl. Dermabond was applied and patient subsequently returned recovery room stable condition

## 2016-07-20 NOTE — Interval H&P Note (Signed)
History and Physical Interval Note:  07/20/2016 12:55 PM  Gary Ferrell  has presented today for surgery, with the diagnosis of lymphoma  The various methods of treatment have been discussed with the patient and family. After consideration of risks, benefits and other options for treatment, the patient has consented to  Procedure(s): INSERTION PORT-A-CATH (N/A) as a surgical intervention .  The patient's history has been reviewed, patient examined, no change in status, stable for surgery.  I have reviewed the patient's chart and labs.  Questions were answered to the patient's satisfaction.     SANKAR,SEEPLAPUTHUR G

## 2016-07-20 NOTE — H&P (View-Only) (Signed)
Patient ID: Gary Ferrell, male   DOB: 15-Sep-1935, 81 y.o.   MRN: [id number]  Chief Complaint  Patient presents with  . Follow-up    HPI Gary Ferrell is a 81 y.o. male.  Here today for follow up and discuss port placement for treatment of B cell lymphoma. Pt is aware he needs a port but was not clear on his diagnosis.   I have reviewed the history of present illness with the patient.   HPI  Past Medical History:  Diagnosis Date  . AICD (automatic cardioverter/defibrillator) present   . BPH (benign prostatic hyperplasia)    a. 06/2012 s/p Greenlight Laser Prostatectomy.  . Carotid disease, bilateral ([place])    a. 1990's s/p L CEA;  b. 11/2004 s/p redo L CEA;  c. 06/2013 Carotid U/S: RICA 01-77%, LICA patent.  . Chronic systolic CHF (congestive heart failure) ([place])    a. 04/2013 Echo: EF 35%, inf DK, inflat AK, diast dysfxn, RV HK, mild MR, nmildly dil LA.  . CKD (chronic kidney disease), stage II   . Coronary artery disease    a. 2007 s/p MI & CABG.  . Diverticulosis    a. 10/2013 colonoscopy (otw nl study).  Marland Kitchen Dyspnea   . GERD (gastroesophageal reflux disease)   . Hyperlipidemia   . Hypertension   . Ischemic cardiomyopathy    a. 04/2013 Echo: EF 35%;  b. 06/2013 s/p SJM Bi-V ICD, ser # [id number].  . Large B-cell lymphoma ([place]) 06/16/2016  . LBBB (left bundle branch block)   . Lung nodules   . Myocardial infarction   . OSA (obstructive sleep apnea)    a. 12/2013 Sleep study: severe OSA - CPAP-   . PAD (peripheral artery disease) ([place])    a. 1990's s/p Aortobifem bypass.  . Presence of permanent cardiac pacemaker   . Spinal stenosis   . Stricture of urethral meatus    a. 08/2009 s/p dil.  . Stroke [facility])    a. 2007 Periop CABG - no residual.    Past Surgical History:  Procedure Laterality Date  . AORTA - BILATERAL FEMORAL ARTERY BYPASS GRAFT  1992  . APPENDECTOMY  1964  . AXILLARY LYMPH NODE BIOPSY Left 06/16/2016   LARGE B-CELL LYMPHOMA/ AXILLARY LYMPH NODE BIOPSY;  Surgeon:  Christene Lye, MD;  Location: [facility] ORS;  Service: General;  Laterality: Left;  . BI-VENTRICULAR IMPLANTABLE CARDIOVERTER DEFIBRILLATOR N/A 06/25/2013   Procedure: BI-VENTRICULAR IMPLANTABLE CARDIOVERTER DEFIBRILLATOR  (CRT-D);  Surgeon: Evans Lance, MD;  Location: [facility] CATH LAB;  Service: Cardiovascular;  Laterality: N/A;  . BI-VENTRICULAR IMPLANTABLE CARDIOVERTER DEFIBRILLATOR  (CRT-D)  06/25/2013  . BI-VENTRICULAR IMPLANTABLE CARDIOVERTER DEFIBRILLATOR  (CRT-D)  06/25/13   STJ CRTD implanted by Dr Lovena Le  . CARDIAC CATHETERIZATION  2007; 2012   "unsuccessful attempt to put stents in in 2007, had MI, had to have CABG;"  . CAROTID ENDARTERECTOMY Left 1992; 2006  . CHOLECYSTECTOMY  ~ 1996  . COLON SURGERY  2007   herniated intestinal obstruction  . CORONARY ARTERY BYPASS GRAFT  2007   "CABG X3" (06/25/2013)  . GREEN LIGHT LASER TURP (TRANSURETHRAL RESECTION OF PROSTATE N/A 07/02/2012   Procedure: GREEN LIGHT LASER TURP (TRANSURETHRAL RESECTION OF PROSTATE;  Surgeon: Ailene Rud, MD;  Location: [facility] ORS;  Service: Urology;  Laterality: N/A;  . SKIN GRAFT  2008   "area of colon surgery wouldn't heal; had this graft over area"  . TONSILLECTOMY    . URETHRAL MEATOPLASTY  08/2009  .  URETHRAL STRICTURE DILATATION  02/2010; 09/2010  . VENTRAL HERNIA REPAIR  2008   ventral epigastric hernia repair [Other]    Family History  Problem Relation Age of Onset  . Pancreatic cancer Father   . Diabetes Brother   . Heart disease Brother   . Colon cancer Maternal Grandmother   . Parkinson's disease Mother   . Lung cancer Brother     stage 4    Social History Social History  Substance Use Topics  . Smoking status: Former Smoker    Packs/day: 1.00    Years: 20.00    Types: Cigarettes    Quit date: 04/11/1989  . Smokeless tobacco: Never Used  . Alcohol use No    Allergies  Allergen Reactions  . Dapsone Other (See Comments)    REACTION: caused neuropathy/ affected walking  patient  stated he received high dose of dapsone which caused the neuropathy   . Gluten Meal Dermatitis    Dermatitis     Current Outpatient Prescriptions  Medication Sig Dispense Refill  . aspirin EC 81 MG tablet Take 81 mg by mouth daily.    . carvedilol (COREG) 3.125 MG tablet TAKE 1 TABLET BY MOUTH 2 (TWO) TIMES DAILY WITH MEALS. 180 tablet 1  . cholecalciferol (VITAMIN D) 1000 UNITS tablet Take 1,000 Units by mouth daily.    Marland Kitchen donepezil (ARICEPT) 5 MG tablet qam  11  . methylcellulose (ARTIFICIAL TEARS) 1 % ophthalmic solution Place 1-2 drops into both eyes daily as needed (dry eyes).    . Multiple Vitamin (MULTIVITAMIN WITH MINERALS) TABS Take 1 tablet by mouth daily.    . rosuvastatin (CRESTOR) 20 MG tablet Take 20 mg by mouth at bedtime. Reported on 06/16/2015    . triamcinolone cream (KENALOG) 0.1 % Apply topically.    . white petrolatum (VASELINE) GEL Apply 1 application topically 2 (two) times daily. Applied to legs     No current facility-administered medications for this visit.     Review of Systems Review of Systems  Constitutional: Negative.   Respiratory: Negative.   Cardiovascular: Negative.     Blood pressure 114/60, pulse 74, resp. rate 12, height 6' (1.829 m), weight 153 lb (69.4 kg).  Physical Exam Physical Exam  Constitutional: He is oriented to person, place, and time. He appears well-developed and well-nourished.  HENT:  Mouth/Throat: Oropharynx is clear and moist.  Eyes: Conjunctivae are normal. No scleral icterus.  Neck: Neck supple.  Cardiovascular: Normal rate, regular rhythm and normal heart sounds.   Pulmonary/Chest: Effort normal and breath sounds normal.  Implanted cardioverter defibrillator present left chest   Lymphadenopathy:    He has no cervical adenopathy.  Left axillary incision clean  Neurological: He is alert and oriented to person, place, and time.  Skin: Skin is warm and dry.  Psychiatric: His behavior is normal.    Data Reviewed Prior  notes  Assessment    Large B cell lymphoma.      Plan   Pt advised on port placement.  The risks associated with central venous access including arterial, pulmonary and venous injury were reviewed. The possible need for additional treatment if pulmonary injury occurs (chest tube placement) was discussed.      This information has been scribed by Karie Fetch RN, BSN,BC.   Arben Packman G 07/13/2016, 3:58 PM

## 2016-07-20 NOTE — Transfer of Care (Signed)
Immediate Anesthesia Transfer of Care Note  Patient: QUANTA ROHER  Procedure(s) Performed: Procedure(s): INSERTION PORT-A-CATH (N/A)  Patient Location: PACU  Anesthesia Type:General  Level of Consciousness: awake, alert  and oriented  Airway & Oxygen Therapy: Patient Spontanous Breathing  Post-op Assessment: Report given to RN and Post -op Vital signs reviewed and stable  Post vital signs: Reviewed and stable  Last Vitals:  Vitals:   07/20/16 1143 07/20/16 1404  BP: 114/66 (!) 92/48  Pulse: 70 (!) 59  Resp: 18 (!) 23  Temp: 36.5 C 36.8 C    Last Pain:  Vitals:   07/20/16 1143  TempSrc: Oral         Complications: No apparent anesthesia complications

## 2016-07-20 NOTE — Anesthesia Post-op Follow-up Note (Cosign Needed)
Anesthesia QCDR form completed.        

## 2016-07-20 NOTE — Discharge Instructions (Signed)
AMBULATORY SURGERY  °DISCHARGE INSTRUCTIONS ° ° °1) The drugs that you were given will stay in your system until tomorrow so for the next 24 hours you should not: ° °A) Drive an automobile °B) Make any legal decisions °C) Drink any alcoholic beverage ° ° °2) You may resume regular meals tomorrow.  Today it is better to start with liquids and gradually work up to solid foods. ° °You may eat anything you prefer, but it is better to start with liquids, then soup and crackers, and gradually work up to solid foods. ° ° °3) Please notify your doctor immediately if you have any unusual bleeding, trouble breathing, redness and pain at the surgery site, drainage, fever, or pain not relieved by medication. ° ° ° °4) Additional Instructions: ° ° ° ° ° ° ° °Please contact your physician with any problems or Same Day Surgery at [phone], Monday through Friday 6 am to 4 pm, or [place] [facility] [place] Main number at [phone]. °

## 2016-07-21 ENCOUNTER — Encounter: Payer: Self-pay | Admitting: General Surgery

## 2016-07-21 NOTE — Anesthesia Postprocedure Evaluation (Signed)
Anesthesia Post Note  Patient: Gary Ferrell  Procedure(s) Performed: Procedure(s) (LRB): INSERTION PORT-A-CATH (N/A)  Patient location during evaluation: PACU Anesthesia Type: General Level of consciousness: awake and alert Pain management: pain level controlled Vital Signs Assessment: post-procedure vital signs reviewed and stable Respiratory status: spontaneous breathing, nonlabored ventilation, respiratory function stable and patient connected to nasal cannula oxygen Cardiovascular status: blood pressure returned to baseline and stable Postop Assessment: no signs of nausea or vomiting Anesthetic complications: no     Last Vitals:  Vitals:   07/20/16 1454 07/20/16 1512  BP: (!) 129/49 (!) 118/57  Pulse: 66   Resp: 14   Temp: 37.2 C     Last Pain:  Vitals:   07/20/16 1404  TempSrc:   PainSc: Asleep                 Martha Clan

## 2016-07-25 ENCOUNTER — Encounter: Payer: Self-pay | Admitting: Internal Medicine

## 2016-07-26 ENCOUNTER — Inpatient Hospital Stay (HOSPITAL_BASED_OUTPATIENT_CLINIC_OR_DEPARTMENT_OTHER): Payer: Medicare Other | Admitting: Internal Medicine

## 2016-07-26 ENCOUNTER — Other Ambulatory Visit: Payer: Self-pay | Admitting: *Deleted

## 2016-07-26 ENCOUNTER — Encounter: Payer: Self-pay | Admitting: *Deleted

## 2016-07-26 ENCOUNTER — Telehealth: Payer: Self-pay | Admitting: Internal Medicine

## 2016-07-26 ENCOUNTER — Inpatient Hospital Stay: Payer: Medicare Other

## 2016-07-26 VITALS — BP 116/65 | HR 79 | Temp 97.0°F | Resp 18 | Wt 154.1 lb

## 2016-07-26 DIAGNOSIS — Z79899 Other long term (current) drug therapy: Secondary | ICD-10-CM

## 2016-07-26 DIAGNOSIS — N182 Chronic kidney disease, stage 2 (mild): Secondary | ICD-10-CM

## 2016-07-26 DIAGNOSIS — R161 Splenomegaly, not elsewhere classified: Secondary | ICD-10-CM

## 2016-07-26 DIAGNOSIS — Z8673 Personal history of transient ischemic attack (TIA), and cerebral infarction without residual deficits: Secondary | ICD-10-CM

## 2016-07-26 DIAGNOSIS — C8338 Diffuse large B-cell lymphoma, lymph nodes of multiple sites: Secondary | ICD-10-CM | POA: Diagnosis not present

## 2016-07-26 DIAGNOSIS — D696 Thrombocytopenia, unspecified: Secondary | ICD-10-CM | POA: Diagnosis not present

## 2016-07-26 DIAGNOSIS — F0390 Unspecified dementia without behavioral disturbance: Secondary | ICD-10-CM | POA: Diagnosis not present

## 2016-07-26 DIAGNOSIS — C8584 Other specified types of non-Hodgkin lymphoma, lymph nodes of axilla and upper limb: Secondary | ICD-10-CM

## 2016-07-26 DIAGNOSIS — Z7952 Long term (current) use of systemic steroids: Secondary | ICD-10-CM

## 2016-07-26 DIAGNOSIS — I255 Ischemic cardiomyopathy: Secondary | ICD-10-CM

## 2016-07-26 DIAGNOSIS — R112 Nausea with vomiting, unspecified: Secondary | ICD-10-CM

## 2016-07-26 DIAGNOSIS — Z95810 Presence of automatic (implantable) cardiac defibrillator: Secondary | ICD-10-CM

## 2016-07-26 DIAGNOSIS — Z7982 Long term (current) use of aspirin: Secondary | ICD-10-CM

## 2016-07-26 DIAGNOSIS — Z801 Family history of malignant neoplasm of trachea, bronchus and lung: Secondary | ICD-10-CM

## 2016-07-26 DIAGNOSIS — E785 Hyperlipidemia, unspecified: Secondary | ICD-10-CM

## 2016-07-26 DIAGNOSIS — I700 Atherosclerosis of aorta: Secondary | ICD-10-CM

## 2016-07-26 DIAGNOSIS — I2510 Atherosclerotic heart disease of native coronary artery without angina pectoris: Secondary | ICD-10-CM

## 2016-07-26 DIAGNOSIS — Z950 Presence of cardiac pacemaker: Secondary | ICD-10-CM

## 2016-07-26 DIAGNOSIS — R21 Rash and other nonspecific skin eruption: Secondary | ICD-10-CM

## 2016-07-26 DIAGNOSIS — G4733 Obstructive sleep apnea (adult) (pediatric): Secondary | ICD-10-CM

## 2016-07-26 DIAGNOSIS — I509 Heart failure, unspecified: Secondary | ICD-10-CM | POA: Diagnosis not present

## 2016-07-26 DIAGNOSIS — T451X5A Adverse effect of antineoplastic and immunosuppressive drugs, initial encounter: Principal | ICD-10-CM

## 2016-07-26 DIAGNOSIS — Z87891 Personal history of nicotine dependence: Secondary | ICD-10-CM

## 2016-07-26 DIAGNOSIS — C8594 Non-Hodgkin lymphoma, unspecified, lymph nodes of axilla and upper limb: Secondary | ICD-10-CM | POA: Diagnosis not present

## 2016-07-26 DIAGNOSIS — I130 Hypertensive heart and chronic kidney disease with heart failure and stage 1 through stage 4 chronic kidney disease, or unspecified chronic kidney disease: Secondary | ICD-10-CM | POA: Diagnosis not present

## 2016-07-26 DIAGNOSIS — Z800 Family history of malignant neoplasm of digestive organs: Secondary | ICD-10-CM

## 2016-07-26 DIAGNOSIS — Z7951 Long term (current) use of inhaled steroids: Secondary | ICD-10-CM

## 2016-07-26 DIAGNOSIS — K219 Gastro-esophageal reflux disease without esophagitis: Secondary | ICD-10-CM

## 2016-07-26 DIAGNOSIS — N400 Enlarged prostate without lower urinary tract symptoms: Secondary | ICD-10-CM

## 2016-07-26 DIAGNOSIS — I252 Old myocardial infarction: Secondary | ICD-10-CM

## 2016-07-26 LAB — CBC WITH DIFFERENTIAL/PLATELET
BASOS ABS: 0 10*3/uL (ref 0–0.1)
Basophils Relative: 1 %
Eosinophils Absolute: 0 10*3/uL (ref 0–0.7)
Eosinophils Relative: 0 %
HEMATOCRIT: 38.5 % — AB (ref 40.0–52.0)
Hemoglobin: 12.4 g/dL — ABNORMAL LOW (ref 13.0–18.0)
LYMPHS PCT: 25 %
Lymphs Abs: 0.9 10*3/uL — ABNORMAL LOW (ref 1.0–3.6)
MCH: 26.6 pg (ref 26.0–34.0)
MCHC: 32.2 g/dL (ref 32.0–36.0)
MCV: 82.6 fL (ref 80.0–100.0)
MONO ABS: 0.6 10*3/uL (ref 0.2–1.0)
Monocytes Relative: 17 %
NEUTROS ABS: 2.1 10*3/uL (ref 1.4–6.5)
Neutrophils Relative %: 57 %
Platelets: 131 10*3/uL — ABNORMAL LOW (ref 150–440)
RBC: 4.66 MIL/uL (ref 4.40–5.90)
RDW: 16.1 % — AB (ref 11.5–14.5)
WBC: 3.6 10*3/uL — ABNORMAL LOW (ref 3.8–10.6)

## 2016-07-26 LAB — COMPREHENSIVE METABOLIC PANEL
ALK PHOS: 107 U/L (ref 38–126)
ALT: 18 U/L (ref 17–63)
AST: 28 U/L (ref 15–41)
Albumin: 3.6 g/dL (ref 3.5–5.0)
Anion gap: 4 — ABNORMAL LOW (ref 5–15)
BILIRUBIN TOTAL: 1.9 mg/dL — AB (ref 0.3–1.2)
BUN: 18 mg/dL (ref 6–20)
CALCIUM: 9.6 mg/dL (ref 8.9–10.3)
CO2: 32 mmol/L (ref 22–32)
CREATININE: 0.79 mg/dL (ref 0.61–1.24)
Chloride: 99 mmol/L — ABNORMAL LOW (ref 101–111)
GFR calc Af Amer: >60 mL/min (ref >60)
Glucose, Bld: 147 mg/dL — ABNORMAL HIGH (ref 65–99)
Potassium: 4 mmol/L (ref 3.5–5.1)
Sodium: 135 mmol/L (ref 135–145)
TOTAL PROTEIN: 5.5 g/dL — AB (ref 6.5–8.1)

## 2016-07-26 LAB — LACTATE DEHYDROGENASE: LDH: 203 U/L — AB (ref 98–192)

## 2016-07-26 MED ORDER — ONDANSETRON HCL 8 MG PO TABS: ORAL_TABLET | ORAL | 1 refills | Status: DC

## 2016-07-26 MED ORDER — PROCHLORPERAZINE MALEATE 10 MG PO TABS: 10.0000 mg | ORAL_TABLET | Freq: Four times a day (QID) | ORAL | 1 refills | Status: DC | PRN

## 2016-07-26 MED ORDER — PREDNISONE 50 MG PO TABS: ORAL_TABLET | ORAL | 4 refills | Status: DC

## 2016-07-26 NOTE — Progress Notes (Signed)
[facility] NOTE  Patient Care Team: Sharyne Peach, MD as PCP - General (Family Medicine) Shela Nevin, MD as Attending Physician (Cardiology) Cammie Sickle, MD as Consulting Physician (Internal Medicine) Christene Lye, MD as Surgeon (General Surgery)  CHIEF COMPLAINTS/PURPOSE OF CONSULTATION:   Oncology History   # MARCH- April 2018[facility] [EBV positive; R. Ax LN; Dr.Sankar]; ? ABC subtype; FEB 20th 2018- PET Borderline enlarged hypermetabolic lymph nodes ] throughout the neck, chest, abdomen and pelvis, together with hypermetabolic Splenomegaly.   # April 2018- R-CEOP [hx of ICMP]      # 2014- THROMBOCYTOPENIA [130s-140s] sec to splenomegaly; Feb- 2017- 107/N-Hb [hep B/C/HIV- Neg]; US-moderate splenomegaly. March 2017- BMBx- Megakaryocytosis/ otherwise no obvious dyspoiesis/ or malignancy; no increase in reticulin fibrosis; cytogenetics-n.    # 2017- ? 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   Gary Ferrell 81 y.o.  male Caucasian patient With newly diagnosed mild to moderate dementia and with above history of diffuse B-cell lymphoma- is here to proceed with chemotherapy with RCEOP today. He is accompanied by his son.  In the interim patient had a Mediport placed.   Patient at last visit was noted to have a rash on his extremities- taken off Aricept. Patient was given triamcinolone has appointment; he has been using only intermittently as per the son. Patient continues to deny any itch.   Poor appetite. Weight stable. Denies  any unusual bleeding. Denies any night sweats or fevers.  Patient noticed to have a rash on his extremities non-itchy over the last 2 weeks also. He has not had mentioned this to anybody else. No sores in the mouth.  ROS: A complete 10 point review of system is done which is negative except mentioned above in history of present illness  MEDICAL HISTORY:  Past Medical History:  Diagnosis Date  . AICD (automatic cardioverter/defibrillator) present   . BPH (benign prostatic hyperplasia)    a. 06/2012 s/p Greenlight Laser Prostatectomy.  . Carotid disease, bilateral ([place])    a. 1990's s/p L CEA;  b. 11/2004 s/p redo L CEA;  c. 06/2013 Carotid U/S: RICA 13-24%, LICA patent.  . Chronic systolic CHF (congestive heart failure) ([place])    a. 04/2013 Echo: EF 35%, inf DK, inflat AK, diast dysfxn, RV HK, mild MR, nmildly dil LA.  Marland Kitchen CINV (chemotherapy-induced nausea and vomiting)   . CKD (chronic kidney disease), stage II   . Coronary artery disease    a. 2007 s/p MI & CABG.  . Dementia   . Diverticulosis    a. 10/2013 colonoscopy (otw nl study).  Marland Kitchen Dyspnea   . GERD (gastroesophageal reflux disease)   . Hyperlipidemia   . Hypertension   . Ischemic cardiomyopathy    a. 04/2013 Echo: EF 35%;  b. 06/2013 s/p SJM Bi-V ICD, ser # [id number].  . Large B-cell lymphoma ([place]) 06/16/2016  . LBBB (left bundle branch block)   . Lung nodules   . Myocardial infarction ([place])   . OSA (obstructive sleep apnea)    a. 12/2013 Sleep study: severe  OSA - CPAP-   . PAD (peripheral artery disease) ([place])    a. 1990's s/p Aortobifem bypass.  . Presence of permanent cardiac pacemaker   . Spinal stenosis   . Stricture of urethral meatus    a. 08/2009 s/p dil.  . Stroke [facility])    a. 2007 Periop CABG - no residual.    SURGICAL HISTORY: Past Surgical History:  Procedure Laterality Date  . AORTA - BILATERAL FEMORAL ARTERY BYPASS GRAFT  1992  . APPENDECTOMY  1964  . AXILLARY LYMPH NODE BIOPSY Left 06/16/2016   LARGE B-CELL  LYMPHOMA/ AXILLARY LYMPH NODE BIOPSY;  Surgeon: Christene Lye, MD;  Location: [facility] ORS;  Service: General;  Laterality: Left;  . BI-VENTRICULAR IMPLANTABLE CARDIOVERTER DEFIBRILLATOR N/A 06/25/2013   Procedure: BI-VENTRICULAR IMPLANTABLE CARDIOVERTER DEFIBRILLATOR  (CRT-D);  Surgeon: Evans Lance, MD;  Location: [facility] CATH LAB;  Service: Cardiovascular;  Laterality: N/A;  . BI-VENTRICULAR IMPLANTABLE CARDIOVERTER DEFIBRILLATOR  (CRT-D)  06/25/2013  . BI-VENTRICULAR IMPLANTABLE CARDIOVERTER DEFIBRILLATOR  (CRT-D)  06/25/13   STJ CRTD implanted by Dr Lovena Le  . CARDIAC CATHETERIZATION  2007; 2012   "unsuccessful attempt to put stents in in 2007, had MI, had to have CABG;"  . CAROTID ENDARTERECTOMY Left 1992; 2006  . CHOLECYSTECTOMY  ~ 1996  . COLON SURGERY  2007   herniated intestinal obstruction  . CORONARY ARTERY BYPASS GRAFT  2007   "CABG X3" (06/25/2013)  . GREEN LIGHT LASER TURP (TRANSURETHRAL RESECTION OF PROSTATE N/A 07/02/2012   Procedure: GREEN LIGHT LASER TURP (TRANSURETHRAL RESECTION OF PROSTATE;  Surgeon: Ailene Rud, MD;  Location: [facility] ORS;  Service: Urology;  Laterality: N/A;  . PORTACATH PLACEMENT N/A 07/20/2016   Procedure: INSERTION PORT-A-CATH;  Surgeon: Christene Lye, MD;  Location: [facility] ORS;  Service: General;  Laterality: N/A;  . SKIN GRAFT  2008   "area of colon surgery wouldn't heal; had this graft over area"  . TONSILLECTOMY    . URETHRAL MEATOPLASTY  08/2009  . URETHRAL STRICTURE DILATATION  02/2010; 09/2010  . VENTRAL HERNIA REPAIR  2008   ventral epigastric hernia repair [Other]    SOCIAL HISTORY:  Patient lives in  [place];   Quit smoking 1992;  No alcohol. He lives alone.  Retired Medical illustrator. Social History   Social History  . Marital status: Married    Spouse name: N/A  . Number of children: 1  . Years of education: N/A   Occupational History  . Retired    Social History Main Topics  . Smoking status: Former Smoker     Packs/day: 1.00    Years: 20.00    Types: Cigarettes    Quit date: 04/11/1989  . Smokeless tobacco: Never Used  . Alcohol use No  . Drug use: No  . Sexual activity: No   Other Topics Concern  . Not on file   Social History Narrative  . No narrative on file    FAMILY HISTORY: Family History  Problem Relation Age of Onset  . Pancreatic cancer Father   . Diabetes Brother   . Heart disease Brother   . Colon cancer Maternal Grandmother   . Parkinson's disease Mother   . Lung cancer Brother     stage 4    ALLERGIES:  is allergic to dapsone and gluten meal.  MEDICATIONS:  Current Outpatient Prescriptions  Medication Sig Dispense Refill  . aspirin EC 81 MG tablet Take 81 mg by mouth daily.    . carvedilol (COREG) 3.125 MG tablet TAKE  1 TABLET BY MOUTH 2 (TWO) TIMES DAILY WITH MEALS. 180 tablet 1  . cholecalciferol (VITAMIN D) 1000 UNITS tablet Take 1,000 Units by mouth daily.    . methylcellulose (ARTIFICIAL TEARS) 1 % ophthalmic solution Place 1-2 drops into both eyes daily as needed (dry eyes).    . Multiple Vitamin (MULTIVITAMIN WITH MINERALS) TABS Take 1 tablet by mouth daily.    . rosuvastatin (CRESTOR) 20 MG tablet Take 20 mg by mouth every morning. Reported on 06/16/2015    . triamcinolone cream (KENALOG) 0.1 % Apply topically.    . white petrolatum (VASELINE) GEL Apply 1 application topically 2 (two) times daily. Applied to legs    . donepezil (ARICEPT) 5 MG tablet qam  11  . ondansetron (ZOFRAN) 8 MG tablet Take one pill every 8 hours as needed for nasuea/ vomitting. 40 tablet 1  . predniSONE (DELTASONE) 50 MG tablet Take 2 tablets once day x 5 days. START on day of your chemotherapy. Take with food. (Patient not taking: Reported on 07/26/2016) 10 tablet 4  . prochlorperazine (COMPAZINE) 10 MG tablet Take 1 tablet (10 mg total) by mouth every 6 (six) hours as needed for nausea or vomiting. (Patient not taking: Reported on 07/26/2016) 40 tablet 1   No current  facility-administered medications for this visit.       Marland Kitchen  PHYSICAL EXAMINATION: ECOG PERFORMANCE STATUS: 0 - Asymptomatic  Vitals:   07/26/16 0840  BP: 116/65  Pulse: 79  Resp: 18  Temp: 97 F (36.1 C)   Filed Weights   07/26/16 0840  Weight: 154 lb 1.6 oz (69.9 kg)     GENERAL: Well-nourished well-developed; Alert, no distress and comfortable.  Accompanied by his son. EYES: no pallor or icterus OROPHARYNX: no thrush or ulceration; good dentition  NECK: supple, no masses felt LYMPH:  no palpable lymphadenopathy in the cervical,or inguinal regions; LN excision noted  LUNGS: clear to auscultation and  No wheeze or crackles HEART/CVS: regular rate & rhythm and no murmurs; No lower extremity edema ABDOMEN: abdomen soft, non-tender and normal bowel sounds; positive for splenomegaly [4 fingers breath below costal margin] Musculoskeletal:no cyanosis of digits and no clubbing  PSYCH: alert & oriented x 3 with fluent speech NEURO: no focal motor/sensory deficits SKIN:   rash present bilateral upper extremities/Lower extremities- erythematous/central clearing. Blanching [worsened since last visit]; also nape of the neck; buttocks.    LABORATORY DATA:  I have reviewed the data as listed Lab Results  Component Value Date   WBC 3.6 (L) 07/26/2016   HGB 12.4 (L) 07/26/2016   HCT 38.5 (L) 07/26/2016   MCV 82.6 07/26/2016   PLT 131 (L) 07/26/2016    Recent Labs  11/03/15 1012 07/26/16 0824  NA 138 135  K 4.1 4.0  CL 103 99*  CO2 30 32  GLUCOSE 112* 147*  BUN 19 18  CREATININE 0.85 0.79  CALCIUM 8.8* 9.6  GFRNONAA >60 >60  GFRAA >60 >60  PROT  --  5.5*  ALBUMIN  --  3.6  AST  --  28  ALT  --  18  ALKPHOS  --  107  BILITOT  --  1.9*     2 weeks ago;  April 17th/ today.  RADIOGRAPHIC STUDIES: I have personally  reviewed the radiological images as listed and agreed with the findings in the report. Nm Cardiac Muga Rest  Result Date: 07/18/2016 CLINICAL DATA:  Large cell lymphoma of the axillary lymph nodes, pre cardiotoxic chemotherapy EXAM: NUCLEAR MEDICINE CARDIAC BLOOD POOL IMAGING (MUGA) TECHNIQUE: Cardiac multi-gated acquisition was performed at rest following intravenous injection of Tc-54m labeled red blood cells. RADIOPHARMACEUTICALS:  22.1 mCi Tc-52m pertechnetate in-vitro labeled autologous red blood cells IV COMPARISON:  None FINDINGS: Calculated LEFT ventricular ejection fraction is 65%, within the normal range. Exam was performed at a cardiac rate of 68 beats per minute. Wall motion analysis in 3 projections demonstrates normal LEFT ventricular wall motion. IMPRESSION: Normal LEFT ventricular ejection fraction of 65%. Normal LV wall motion. Electronically Signed   By: Lavonia Dana M.D.   On: 07/18/2016 10:25   Dg Chest Port 1 View  Result Date: 07/20/2016 CLINICAL DATA:  Port placement EXAM: PORTABLE CHEST 1 VIEW COMPARISON:  06/26/2013 chest radiograph. FINDINGS: Right subclavian MediPort courses inferiorly into the superior vena cava, with the tip not well seen on this image due to overlying ICD leads. Three lead left subclavian ICD is noted with lead tips overlying the right atrium and right ventricle. Median sternotomy wires appear intact. Stable cardiomediastinal silhouette with normal heart size and aortic atherosclerosis. No pneumothorax. Stable chronic blunting of the left costophrenic angle compatible with pleuroparenchymal scarring. No appreciable pleural effusions. No pulmonary edema. No acute consolidative airspace disease. IMPRESSION: 1. Right subclavian MediPort appears well positioned, see comments. No pneumothorax. 2. No active cardiopulmonary disease. 3. Aortic atherosclerosis. Electronically Signed   By: Ilona Sorrel M.D.   On: 07/20/2016 14:50   Dg C-arm 1-60 Min-no Report  Result  Date: 07/20/2016 Fluoroscopy was utilized by the requesting physician.  No radiographic interpretation.    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   # HOLD  #1 of R-CEOP [avoid Adriamycin given history of cardiomyopathy]- given the skin rash [C discussion below]. Prescribed prednisone antiemetics.  # Skin rash- worsening; recommend urgent evaluation with dermatology. Referral to dermatology. I spoke to Dr. Phillip Heal who kindly agreed to evaluate the patient today. Skin biopsy.  # Comorbidities-mild dementia/history of compensated ischemic cardiomyopathy/CAD history of stroke.- MUGA scan- 65%.  # RTC in 1 week/start chemo then; no labs.     Cammie Sickle, MD 07/26/2016 1:11 PM

## 2016-07-26 NOTE — Telephone Encounter (Signed)
Spoke to Dr.Graham-Benitze; will see pt today; also spoke to pt's son- [name] of the plan.

## 2016-07-26 NOTE — Progress Notes (Signed)
[facility] called [phone]-needs office to resubmit zofran rx with dx codes associated with the drug dispense. [facility]attempting to perform prior auth so that pt can get drug. rx resubmitted.

## 2016-07-26 NOTE — Assessment & Plan Note (Addendum)
#   STAGE IV- Diffuse large B-cell lymphoma-left axillary lymph node; Positive for myc gene re-arrangement.   # HOLD  #1 of R-CEOP [avoid Adriamycin given history of cardiomyopathy]- given the skin rash [C discussion below]. Prescribed prednisone antiemetics.  # Skin rash- worsening; recommend urgent evaluation with dermatology. Referral to dermatology. I spoke to Dr. Phillip Heal who kindly agreed to evaluate the patient today. Skin biopsy.  # Comorbidities-mild dementia/history of compensated ischemic cardiomyopathy/CAD history of stroke.- MUGA scan- 65%.  # RTC in 1 week/start chemo then; no labs.

## 2016-07-26 NOTE — Progress Notes (Signed)
Pt noted to continue to have rash-some raised area on hands

## 2016-07-27 ENCOUNTER — Ambulatory Visit: Payer: Medicare Other

## 2016-07-28 ENCOUNTER — Ambulatory Visit: Payer: Medicare Other

## 2016-07-29 ENCOUNTER — Ambulatory Visit: Payer: Medicare Other

## 2016-07-29 ENCOUNTER — Encounter: Payer: Self-pay | Admitting: Pathology

## 2016-07-29 ENCOUNTER — Telehealth: Payer: Self-pay | Admitting: *Deleted

## 2016-07-29 NOTE — Telephone Encounter (Signed)
Prior auth for zofran submitted to covermymeds and approved by [employer]. 07/29/16

## 2016-08-02 ENCOUNTER — Inpatient Hospital Stay (HOSPITAL_BASED_OUTPATIENT_CLINIC_OR_DEPARTMENT_OTHER): Payer: Medicare Other | Admitting: Internal Medicine

## 2016-08-02 ENCOUNTER — Telehealth: Payer: Self-pay | Admitting: Internal Medicine

## 2016-08-02 DIAGNOSIS — Z801 Family history of malignant neoplasm of trachea, bronchus and lung: Secondary | ICD-10-CM

## 2016-08-02 DIAGNOSIS — I2510 Atherosclerotic heart disease of native coronary artery without angina pectoris: Secondary | ICD-10-CM | POA: Diagnosis not present

## 2016-08-02 DIAGNOSIS — Z8673 Personal history of transient ischemic attack (TIA), and cerebral infarction without residual deficits: Secondary | ICD-10-CM

## 2016-08-02 DIAGNOSIS — I700 Atherosclerosis of aorta: Secondary | ICD-10-CM | POA: Diagnosis not present

## 2016-08-02 DIAGNOSIS — I130 Hypertensive heart and chronic kidney disease with heart failure and stage 1 through stage 4 chronic kidney disease, or unspecified chronic kidney disease: Secondary | ICD-10-CM

## 2016-08-02 DIAGNOSIS — E785 Hyperlipidemia, unspecified: Secondary | ICD-10-CM

## 2016-08-02 DIAGNOSIS — I509 Heart failure, unspecified: Secondary | ICD-10-CM | POA: Diagnosis not present

## 2016-08-02 DIAGNOSIS — K219 Gastro-esophageal reflux disease without esophagitis: Secondary | ICD-10-CM

## 2016-08-02 DIAGNOSIS — C8594 Non-Hodgkin lymphoma, unspecified, lymph nodes of axilla and upper limb: Secondary | ICD-10-CM | POA: Diagnosis not present

## 2016-08-02 DIAGNOSIS — Z7952 Long term (current) use of systemic steroids: Secondary | ICD-10-CM

## 2016-08-02 DIAGNOSIS — R21 Rash and other nonspecific skin eruption: Secondary | ICD-10-CM | POA: Diagnosis not present

## 2016-08-02 DIAGNOSIS — R161 Splenomegaly, not elsewhere classified: Secondary | ICD-10-CM

## 2016-08-02 DIAGNOSIS — I255 Ischemic cardiomyopathy: Secondary | ICD-10-CM

## 2016-08-02 DIAGNOSIS — N400 Enlarged prostate without lower urinary tract symptoms: Secondary | ICD-10-CM

## 2016-08-02 DIAGNOSIS — Z951 Presence of aortocoronary bypass graft: Secondary | ICD-10-CM

## 2016-08-02 DIAGNOSIS — Z95810 Presence of automatic (implantable) cardiac defibrillator: Secondary | ICD-10-CM

## 2016-08-02 DIAGNOSIS — Z800 Family history of malignant neoplasm of digestive organs: Secondary | ICD-10-CM

## 2016-08-02 DIAGNOSIS — N182 Chronic kidney disease, stage 2 (mild): Secondary | ICD-10-CM

## 2016-08-02 DIAGNOSIS — Z7982 Long term (current) use of aspirin: Secondary | ICD-10-CM

## 2016-08-02 DIAGNOSIS — F0390 Unspecified dementia without behavioral disturbance: Secondary | ICD-10-CM | POA: Diagnosis not present

## 2016-08-02 DIAGNOSIS — D696 Thrombocytopenia, unspecified: Secondary | ICD-10-CM

## 2016-08-02 DIAGNOSIS — Z950 Presence of cardiac pacemaker: Secondary | ICD-10-CM

## 2016-08-02 DIAGNOSIS — Z79899 Other long term (current) drug therapy: Secondary | ICD-10-CM

## 2016-08-02 DIAGNOSIS — I252 Old myocardial infarction: Secondary | ICD-10-CM

## 2016-08-02 DIAGNOSIS — Z87891 Personal history of nicotine dependence: Secondary | ICD-10-CM

## 2016-08-02 DIAGNOSIS — G4733 Obstructive sleep apnea (adult) (pediatric): Secondary | ICD-10-CM

## 2016-08-02 DIAGNOSIS — C8584 Other specified types of non-Hodgkin lymphoma, lymph nodes of axilla and upper limb: Secondary | ICD-10-CM

## 2016-08-02 NOTE — Progress Notes (Signed)
Patient here today for follow up.  Patient states that blisters/rash is spreading to more areas of the body

## 2016-08-02 NOTE — Telephone Encounter (Signed)
Left message for son to call us re: his fathers dermatology appt.

## 2016-08-02 NOTE — Assessment & Plan Note (Signed)
#   STAGE IV- Diffuse large B-cell lymphoma-left axillary lymph node; Positive for myc gene re-arrangement.   # HOLD  #1 of R-CEOP [avoid Adriamycin given history of cardiomyopathy]- given the skin rash [C discussion below]. We'll plan to start next week.  # Skin rash- worsening- status post evaluation with dermatology. Biopsy suggestive of lichen planus. Left a message for Dr. Phillip Heal to discuss treatment options.  # Comorbidities-mild dementia/history of compensated ischemic cardiomyopathy/CAD history of stroke.- MUGA scan- 65%. Dementia- can have significant impact on the patient's management of his aggressive lymphoma.  # RTC in 1 week/start chemo then; labs. I reviewed the above plan with the patient's son over the phone.

## 2016-08-02 NOTE — Progress Notes (Signed)
[place] NOTE  Patient Care Team: Sharyne Peach, MD as PCP - General (Family Medicine) Shela Nevin, MD as Attending Physician (Cardiology) Cammie Sickle, MD as Consulting Physician (Internal Medicine) Christene Lye, MD as Surgeon (General Surgery)  CHIEF COMPLAINTS/PURPOSE OF CONSULTATION:   Oncology History   # MARCH- [facility] 2018[facility] ST[facility] IV- [EBV positive; R. Ax LN; Dr.Sankar]; ? ABC subtype;POSITIVE FOR MYC GENE-RE-ARRANAGMENT.  FEB 20th 2018- PET Borderline enlarged hypermetabolic lymph nodes ] throughout the neck, chest, abdomen and pelvis, together with hypermetabolic Splenomegaly.   # April 2018- R-CEOP [hx of ICMP; April 2018- EF65%].    # 2014- THROMBOCYTOPENIA [130s-140s] sec to splenomegaly; Feb- 2017- 107/N-Hb [hep B/C/HIV- Neg]; US-moderate splenomegaly. March 2017- BMBx- Megakaryocytosis/ otherwise no obvious dyspoiesis/ or malignancy; no increase in reticulin fibrosis; cytogenetics-n.    # 2017- ? 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   Gary Ferrell 81 y.o.  male Caucasian patient With newly diagnosed mild to moderate dementia and with above history of diffuse B-cell lymphoma- is here to proceed with chemotherapy with RCEOP today.He is alone.   In the interim patient was evaluated by dermatology; had a skin biopsy. Patient has a follow-up appointment next week with dermatology again.  Patient does not recollect seeing dermatologist or having a biopsy. He continues to complain of worsening rash. Complains of  itch. He has been not consistent with steroid topical  ROS: A complete 10 point review of system is done which is negative except mentioned above in history of present illness  MEDICAL HISTORY:  Past Medical History:  Diagnosis Date  . AICD (automatic cardioverter/defibrillator) present   . BPH (benign prostatic hyperplasia)    a. 06/2012 s/p Greenlight Laser Prostatectomy.  . Carotid disease, bilateral ([place])    a. 1990's s/p L CEA;  b. 11/2004 s/p redo L CEA;  c. 06/2013 Carotid U/S: RICA 76-16%, LICA patent.  . Chronic systolic CHF (congestive heart failure) ([place])    a. 04/2013 Echo: EF 35%, inf DK, inflat AK, diast dysfxn, RV HK, mild MR, nmildly dil LA.  Marland Kitchen CINV (chemotherapy-induced nausea and vomiting)   . CKD (chronic kidney disease), stage II   . Coronary artery disease    a. 2007 s/p MI & CABG.  . Dementia   . Diverticulosis    a. 10/2013 colonoscopy (otw nl study).  Marland Kitchen Dyspnea   . GERD (gastroesophageal reflux disease)   . Hyperlipidemia   . Hypertension   . Ischemic cardiomyopathy    a. 04/2013 Echo: EF 35%;  b. 06/2013 s/p SJM Bi-V ICD, ser # [id number].  . Large B-cell lymphoma ([place]) 06/16/2016  . LBBB (left bundle branch block)   . Lung nodules   . Myocardial infarction ([place])   . OSA (obstructive sleep apnea)    a. 12/2013 Sleep study: severe OSA - CPAP-   . PAD (peripheral artery disease) ([place])    a. 1990's s/p Aortobifem bypass.  . Presence of permanent cardiac pacemaker   . Spinal stenosis   . Stricture of urethral meatus  a. 08/2009 s/p dil.  . Stroke [facility])    a. 2007 Periop CABG - no residual.    SURGICAL HISTORY: Past Surgical History:  Procedure Laterality Date  . AORTA - BILATERAL FEMORAL ARTERY BYPASS GRAFT  1992  . APPENDECTOMY  1964  . AXILLARY LYMPH NODE BIOPSY Left 06/16/2016   LARGE B-CELL LYMPHOMA/ AXILLARY LYMPH NODE BIOPSY;  Surgeon: Christene Lye, MD;  Location: [facility] ORS;  Service: General;  Laterality: Left;  . BI-VENTRICULAR IMPLANTABLE  CARDIOVERTER DEFIBRILLATOR N/A 06/25/2013   Procedure: BI-VENTRICULAR IMPLANTABLE CARDIOVERTER DEFIBRILLATOR  (CRT-D);  Surgeon: Evans Lance, MD;  Location: [facility] CATH LAB;  Service: Cardiovascular;  Laterality: N/A;  . BI-VENTRICULAR IMPLANTABLE CARDIOVERTER DEFIBRILLATOR  (CRT-D)  06/25/2013  . BI-VENTRICULAR IMPLANTABLE CARDIOVERTER DEFIBRILLATOR  (CRT-D)  06/25/13   STJ CRTD implanted by Dr Lovena Le  . CARDIAC CATHETERIZATION  2007; 2012   "unsuccessful attempt to put stents in in 2007, had MI, had to have CABG;"  . CAROTID ENDARTERECTOMY Left 1992; 2006  . CHOLECYSTECTOMY  ~ 1996  . COLON SURGERY  2007   herniated intestinal obstruction  . CORONARY ARTERY BYPASS GRAFT  2007   "CABG X3" (06/25/2013)  . GREEN LIGHT LASER TURP (TRANSURETHRAL RESECTION OF PROSTATE N/A 07/02/2012   Procedure: GREEN LIGHT LASER TURP (TRANSURETHRAL RESECTION OF PROSTATE;  Surgeon: Ailene Rud, MD;  Location: [facility] ORS;  Service: Urology;  Laterality: N/A;  . PORTACATH PLACEMENT N/A 07/20/2016   Procedure: INSERTION PORT-A-CATH;  Surgeon: Christene Lye, MD;  Location: [facility] ORS;  Service: General;  Laterality: N/A;  . SKIN GRAFT  2008   "area of colon surgery wouldn't heal; had this graft over area"  . TONSILLECTOMY    . URETHRAL MEATOPLASTY  08/2009  . URETHRAL STRICTURE DILATATION  02/2010; 09/2010  . VENTRAL HERNIA REPAIR  2008   ventral epigastric hernia repair [Other]    SOCIAL HISTORY:  Patient lives in  [place];   Quit smoking 1992;  No alcohol. He lives alone.  Retired Medical illustrator. Social History   Social History  . Marital status: Married    Spouse name: N/A  . Number of children: 1  . Years of education: N/A   Occupational History  . Retired    Social History Main Topics  . Smoking status: Former Smoker    Packs/day: 1.00    Years: 20.00    Types: Cigarettes    Quit date: 04/11/1989  . Smokeless tobacco: Never Used  . Alcohol use No  . Drug use: No  . Sexual activity: No     Other Topics Concern  . Not on file   Social History Narrative  . No narrative on file    FAMILY HISTORY: Family History  Problem Relation Age of Onset  . Pancreatic cancer Father   . Diabetes Brother   . Heart disease Brother   . Colon cancer Maternal Grandmother   . Parkinson's disease Mother   . Lung cancer Brother     stage 4    ALLERGIES:  is allergic to dapsone and gluten meal.  MEDICATIONS:  Current Outpatient Prescriptions  Medication Sig Dispense Refill  . aspirin EC 81 MG tablet Take 81 mg by mouth daily.    . carvedilol (COREG) 3.125 MG tablet TAKE 1 TABLET BY MOUTH 2 (TWO) TIMES DAILY WITH MEALS. 180 tablet 1  . cholecalciferol (VITAMIN D) 1000 UNITS tablet Take 1,000 Units by mouth daily.    Marland Kitchen donepezil (ARICEPT) 5 MG tablet qam  11  .  methylcellulose (ARTIFICIAL TEARS) 1 % ophthalmic solution Place 1-2 drops into both eyes daily as needed (dry eyes).    . Multiple Vitamin (MULTIVITAMIN WITH MINERALS) TABS Take 1 tablet by mouth daily.    . ondansetron (ZOFRAN) 8 MG tablet Take one pill every 8 hours as needed for nasuea/ vomitting. 40 tablet 1  . predniSONE (DELTASONE) 50 MG tablet Take 2 tablets once day x 5 days. START on day of your chemotherapy. Take with food. 10 tablet 4  . prochlorperazine (COMPAZINE) 10 MG tablet Take 1 tablet (10 mg total) by mouth every 6 (six) hours as needed for nausea or vomiting. 40 tablet 1  . rosuvastatin (CRESTOR) 20 MG tablet Take 20 mg by mouth every morning. Reported on 06/16/2015    . triamcinolone cream (KENALOG) 0.1 % Apply topically.     No current facility-administered medications for this visit.       Marland Kitchen  PHYSICAL EXAMINATION: ECOG PERFORMANCE STATUS: 0 - Asymptomatic  Vitals:   08/02/16 0940  BP: (!) 114/58  Pulse: 67  Resp: 16  Temp: 97.1 F (36.2 C)   Filed Weights   08/02/16 0940  Weight: 157 lb 10.1 oz (71.5 kg)     GENERAL: Well-nourished well-developed; Alert, no distress and comfortable.   Accompanied by his son. EYES: no pallor or icterus OROPHARYNX: no thrush or ulceration; good dentition  NECK: supple, no masses felt LYMPH:  no palpable lymphadenopathy in the cervical,or inguinal regions; LN excision noted  LUNGS: clear to auscultation and  No wheeze or crackles HEART/CVS: regular rate & rhythm and no murmurs; No lower extremity edema ABDOMEN: abdomen soft, non-tender and normal bowel sounds; positive for splenomegaly [4 fingers breath below costal margin] Musculoskeletal:no cyanosis of digits and no clubbing  PSYCH: alert & oriented x 3 with fluent speech NEURO: no focal motor/sensory deficits SKIN:   rash present bilateral upper extremities/Lower extremities- erythematous/central clearing. Blanching [worsened since last visit]; also nape of the neck; buttocks.    LABORATORY DATA:  I have reviewed the data as listed Lab Results  Component Value Date   WBC 3.6 (L) 07/26/2016   HGB 12.4 (L) 07/26/2016   HCT 38.5 (L) 07/26/2016   MCV 82.6 07/26/2016   PLT 131 (L) 07/26/2016    Recent Labs  11/03/15 1012 07/26/16 0824  NA 138 135  K 4.1 4.0  CL 103 99*  CO2 30 32  GLUCOSE 112* 147*  BUN 19 18  CREATININE 0.85 0.79  CALCIUM 8.8* 9.6  GFRNONAA >60 >60  GFRAA >60 >60  PROT  --  5.5*  ALBUMIN  --  3.6  AST  --  28  ALT  --  18  ALKPHOS  --  107  BILITOT  --  1.9*     2 weeks ago;  April 17th/ today.   april24th 2018    April 24th 2018.   RADIOGRAPHIC STUDIES: I have personally reviewed the radiological images as listed and agreed with the findings in the report. Nm Cardiac Muga Rest  Result Date: 07/18/2016 CLINICAL DATA:  Large cell lymphoma of the axillary lymph nodes, pre cardiotoxic chemotherapy EXAM: NUCLEAR MEDICINE CARDIAC BLOOD POOL IMAGING (MUGA) TECHNIQUE: Cardiac multi-gated  acquisition was performed at rest following intravenous injection of Tc-41m labeled red blood cells. RADIOPHARMACEUTICALS:  22.1 mCi Tc-58m pertechnetate in-vitro labeled autologous red blood cells IV COMPARISON:  None FINDINGS: Calculated LEFT ventricular ejection fraction is 65%, within the normal range. Exam was performed at a cardiac rate of 68 beats per minute. Wall motion analysis in 3 projections demonstrates normal LEFT ventricular wall motion. IMPRESSION: Normal LEFT ventricular ejection fraction of 65%. Normal LV wall motion. Electronically Signed   By: Lavonia Dana M.D.   On: 07/18/2016 10:25   Dg Chest Port 1 View  Result Date: 07/20/2016 CLINICAL DATA:  Port placement EXAM: PORTABLE CHEST 1 VIEW COMPARISON:  06/26/2013 chest radiograph. FINDINGS: Right subclavian MediPort courses inferiorly into the superior vena cava, with the tip not well seen on this image due to overlying ICD leads. Three lead left subclavian ICD is noted with lead tips overlying the right atrium and right ventricle. Median sternotomy wires appear intact. Stable cardiomediastinal silhouette with normal heart size and aortic atherosclerosis. No pneumothorax. Stable chronic blunting of the left costophrenic angle compatible with pleuroparenchymal scarring. No appreciable pleural effusions. No pulmonary edema. No acute consolidative airspace disease. IMPRESSION: 1. Right subclavian MediPort appears well positioned, see comments. No pneumothorax. 2. No active cardiopulmonary disease. 3. Aortic atherosclerosis. Electronically Signed   By: Ilona Sorrel M.D.   On: 07/20/2016 14:50   Dg C-arm 1-60 Min-no Report  Result Date: 07/20/2016 Fluoroscopy was utilized by the requesting physician.  No radiographic interpretation.    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   # HOLD  #1 of R-CEOP  [avoid Adriamycin given history of cardiomyopathy]- given the skin rash [C discussion below]. We'll plan to start next week.  # Skin rash- worsening- status post evaluation with dermatology. Biopsy suggestive of lichen planus. Left a message for Dr. Phillip Heal to discuss treatment options.  # Comorbidities-mild dementia/history of compensated ischemic cardiomyopathy/CAD history of stroke.- MUGA scan- 65%. Dementia- can have significant impact on the patient's management of his aggressive lymphoma.  # RTC in 1 week/start chemo then; labs. I reviewed the above plan with the patient's son over the phone.     Cammie Sickle, MD 08/02/2016 1:14 PM

## 2016-08-04 ENCOUNTER — Ambulatory Visit (INDEPENDENT_AMBULATORY_CARE_PROVIDER_SITE_OTHER): Payer: Medicare Other | Admitting: General Surgery

## 2016-08-04 ENCOUNTER — Encounter: Payer: Self-pay | Admitting: General Surgery

## 2016-08-04 ENCOUNTER — Telehealth: Payer: Self-pay | Admitting: *Deleted

## 2016-08-04 VITALS — BP 124/68 | HR 80 | Resp 14 | Ht 70.0 in | Wt 154.0 lb

## 2016-08-04 DIAGNOSIS — C8334 Diffuse large B-cell lymphoma, lymph nodes of axilla and upper limb: Secondary | ICD-10-CM

## 2016-08-04 NOTE — Patient Instructions (Addendum)
Patient to continue care with Dr. Yevette Edwards from here, and return as needed.

## 2016-08-04 NOTE — Progress Notes (Signed)
Patient ID: METRO EDENFIELD, male   DOB: 1935/10/23, 81 y.o.   MRN: [id number]  Chief Complaint  Patient presents with  . Follow-up    HPI Gary Ferrell is a 81 y.o. male here today for his follow up portacath placement done on 07/20/2016. Patient states he is doing well.  HPI  Past Medical History:  Diagnosis Date  . AICD (automatic cardioverter/defibrillator) present   . BPH (benign prostatic hyperplasia)    a. 06/2012 s/p Greenlight Laser Prostatectomy.  . Carotid disease, bilateral ([place])    a. 1990's s/p L CEA;  b. 11/2004 s/p redo L CEA;  c. 06/2013 Carotid U/S: RICA 57-32%, LICA patent.  . Chronic systolic CHF (congestive heart failure) ([place])    a. 04/2013 Echo: EF 35%, inf DK, inflat AK, diast dysfxn, RV HK, mild MR, nmildly dil LA.  Marland Kitchen CINV (chemotherapy-induced nausea and vomiting)   . CKD (chronic kidney disease), stage II   . Coronary artery disease    a. 2007 s/p MI & CABG.  . Dementia   . Diverticulosis    a. 10/2013 colonoscopy (otw nl study).  Marland Kitchen Dyspnea   . GERD (gastroesophageal reflux disease)   . Hyperlipidemia   . Hypertension   . Ischemic cardiomyopathy    a. 04/2013 Echo: EF 35%;  b. 06/2013 s/p SJM Bi-V ICD, ser # [id number].  . Large B-cell lymphoma ([place]) 06/16/2016  . LBBB (left bundle branch block)   . Lung nodules   . Myocardial infarction ([place])   . OSA (obstructive sleep apnea)    a. 12/2013 Sleep study: severe OSA - CPAP-   . PAD (peripheral artery disease) ([place])    a. 1990's s/p Aortobifem bypass.  . Presence of permanent cardiac pacemaker   . Spinal stenosis   . Stricture of urethral meatus    a. 08/2009 s/p dil.  . Stroke [facility])    a. 2007 Periop CABG - no residual.    Past Surgical History:  Procedure Laterality Date  . AORTA - BILATERAL FEMORAL ARTERY BYPASS GRAFT  1992  . APPENDECTOMY  1964  . AXILLARY LYMPH NODE BIOPSY Left 06/16/2016   LARGE B-CELL LYMPHOMA/ AXILLARY LYMPH NODE BIOPSY;  Surgeon: Christene Lye, MD;  Location: [facility] ORS;   Service: General;  Laterality: Left;  . BI-VENTRICULAR IMPLANTABLE CARDIOVERTER DEFIBRILLATOR N/A 06/25/2013   Procedure: BI-VENTRICULAR IMPLANTABLE CARDIOVERTER DEFIBRILLATOR  (CRT-D);  Surgeon: Evans Lance, MD;  Location: [facility] CATH LAB;  Service: Cardiovascular;  Laterality: N/A;  . BI-VENTRICULAR IMPLANTABLE CARDIOVERTER DEFIBRILLATOR  (CRT-D)  06/25/2013  . BI-VENTRICULAR IMPLANTABLE CARDIOVERTER DEFIBRILLATOR  (CRT-D)  06/25/13   STJ CRTD implanted by Dr Lovena Le  . CARDIAC CATHETERIZATION  2007; 2012   "unsuccessful attempt to put stents in in 2007, had MI, had to have CABG;"  . CAROTID ENDARTERECTOMY Left 1992; 2006  . CHOLECYSTECTOMY  ~ 1996  . COLON SURGERY  2007   herniated intestinal obstruction  . CORONARY ARTERY BYPASS GRAFT  2007   "CABG X3" (06/25/2013)  . GREEN LIGHT LASER TURP (TRANSURETHRAL RESECTION OF PROSTATE N/A 07/02/2012   Procedure: GREEN LIGHT LASER TURP (TRANSURETHRAL RESECTION OF PROSTATE;  Surgeon: Ailene Rud, MD;  Location: [facility] ORS;  Service: Urology;  Laterality: N/A;  . PORTACATH PLACEMENT N/A 07/20/2016   Procedure: INSERTION PORT-A-CATH;  Surgeon: Christene Lye, MD;  Location: [facility] ORS;  Service: General;  Laterality: N/A;  . SKIN GRAFT  2008   "area of colon surgery wouldn't heal; had this graft over area"  .  TONSILLECTOMY    . URETHRAL MEATOPLASTY  08/2009  . URETHRAL STRICTURE DILATATION  02/2010; 09/2010  . VENTRAL HERNIA REPAIR  2008   ventral epigastric hernia repair [Other]    Family History  Problem Relation Age of Onset  . Pancreatic cancer Father   . Diabetes Brother   . Heart disease Brother   . Colon cancer Maternal Grandmother   . Parkinson's disease Mother   . Lung cancer Brother     stage 4    Social History Social History  Substance Use Topics  . Smoking status: Former Smoker    Packs/day: 1.00    Years: 20.00    Types: Cigarettes    Quit date: 04/11/1989  . Smokeless tobacco: Never Used  . Alcohol use No     Allergies  Allergen Reactions  . Dapsone Other (See Comments)    REACTION: caused neuropathy/ affected walking  patient stated he received high dose of dapsone which caused the neuropathy   . Gluten Meal Dermatitis    Dermatitis     Current Outpatient Prescriptions  Medication Sig Dispense Refill  . aspirin EC 81 MG tablet Take 81 mg by mouth daily.    . carvedilol (COREG) 3.125 MG tablet TAKE 1 TABLET BY MOUTH 2 (TWO) TIMES DAILY WITH MEALS. 180 tablet 1  . cholecalciferol (VITAMIN D) 1000 UNITS tablet Take 1,000 Units by mouth daily.    Marland Kitchen donepezil (ARICEPT) 5 MG tablet qam  11  . methylcellulose (ARTIFICIAL TEARS) 1 % ophthalmic solution Place 1-2 drops into both eyes daily as needed (dry eyes).    . Multiple Vitamin (MULTIVITAMIN WITH MINERALS) TABS Take 1 tablet by mouth daily.    . ondansetron (ZOFRAN) 8 MG tablet Take one pill every 8 hours as needed for nasuea/ vomitting. 40 tablet 1  . predniSONE (DELTASONE) 50 MG tablet Take 2 tablets once day x 5 days. START on day of your chemotherapy. Take with food. 10 tablet 4  . prochlorperazine (COMPAZINE) 10 MG tablet Take 1 tablet (10 mg total) by mouth every 6 (six) hours as needed for nausea or vomiting. 40 tablet 1  . rosuvastatin (CRESTOR) 20 MG tablet Take 20 mg by mouth every morning. Reported on 06/16/2015    . triamcinolone cream (KENALOG) 0.1 % Apply topically.     No current facility-administered medications for this visit.     Review of Systems Review of Systems  Constitutional: Negative.   Respiratory: Negative.   Cardiovascular: Negative.     Blood pressure 124/68, pulse 80, resp. rate 14, height 5\' 10"  (1.778 m), weight 154 lb (69.9 kg).  Physical Exam Physical Exam  Constitutional: He is oriented to person, place, and time. He appears well-developed and well-nourished.  Pulmonary/Chest:    Neurological: He is alert and oriented to person, place, and time.  Patient is basically alert and oriented but  does exhibit some mild confusion.   Skin: Skin is warm and dry.       Data Reviewed   Assessment    Diffuse large B-cell lymphoma of axilla lymph nodes.  Stable exam. Port intact and incision healing well.    Multiple plaque-like lesions on hands and forearms. Will speak with Dr. Rogue Bussing about addressing this.   Patient does exhibit some mild confusion. He wasn't sure what his port was for, and seemed to think he needed to have stitches removed from his lymph node excision side, left  axilla, but there are no stitches to be removed as they were  subcuticular and dissolved.  He is here alone today, but usually comes with his son.   Plan    Patient to continue care with Dr. Rogue Bussing from here, and return as needed. Will speak with Dr. Rogue Bussing about concerns noted above.    HPI, Physical Exam, Assessment and Plan have been scribed under the direction and in the presence of Mckinley Jewel, MD  Gaspar Cola, CMA  I have completed the exam and reviewed the above documentation for accuracy and completeness.  I agree with the above.  Haematologist has been used and any errors in dictation or transcription are unintentional.  Seeplaputhur G. Jamal Collin, M.D., F.A.C.S. Junie Panning G 08/04/2016, 10:14 AM

## 2016-08-04 NOTE — Telephone Encounter (Signed)
RN Contacted patient's son [name] per Dr. Rogue Bussing. MD was contacted by Dr. Jamal Collin who voiced concerns about pt's level of confusion. MDs are concerned about pt driving himself to and from appointments. Explained to pt's son that the patient has been confused on multiple occassions in the office visits in our office. Pt son states, "from my perspective, I am not concerned. I have followed him before in driving and he is able to get himself back and forth to appointments without any problems. I am not able to come with him. I do the best I can with the appointments that I can get to. At this time, I am not concerned about this."

## 2016-08-09 ENCOUNTER — Inpatient Hospital Stay: Payer: Medicare Other

## 2016-08-09 ENCOUNTER — Inpatient Hospital Stay: Payer: Medicare Other | Admitting: Internal Medicine

## 2016-08-09 ENCOUNTER — Other Ambulatory Visit: Payer: Self-pay | Admitting: *Deleted

## 2016-08-09 ENCOUNTER — Telehealth: Payer: Self-pay | Admitting: *Deleted

## 2016-08-09 DIAGNOSIS — C8584 Other specified types of non-Hodgkin lymphoma, lymph nodes of axilla and upper limb: Secondary | ICD-10-CM

## 2016-08-09 NOTE — Telephone Encounter (Signed)
Patient's family contacted cancer center. Pt currently hospitalized at [facility]. Reviewed chart. Pt transported by EMS for fall at pt's home. Pt found in feces and urine per documentation at [facility]. Also dx with UTI.  md informed of pt's recent admission.

## 2016-08-10 ENCOUNTER — Ambulatory Visit: Payer: Medicare Other

## 2016-08-11 ENCOUNTER — Ambulatory Visit: Payer: Medicare Other

## 2016-08-12 ENCOUNTER — Ambulatory Visit: Payer: Medicare Other

## 2016-08-16 ENCOUNTER — Telehealth: Payer: Self-pay | Admitting: *Deleted

## 2016-08-16 ENCOUNTER — Inpatient Hospital Stay: Payer: Medicare Other | Admitting: Internal Medicine

## 2016-08-16 NOTE — Assessment & Plan Note (Deleted)
#   STAGE IV- Diffuse large B-cell lymphoma-left axillary lymph node; Positive for myc gene re-arrangement.   # HOLD  #1 of R-CEOP [avoid Adriamycin given history of cardiomyopathy]- given the skin rash [C discussion below]. We'll plan to start next week.  # Skin rash- worsening- status post evaluation with dermatology. Biopsy suggestive of lichen planus. Left a message for Dr. Phillip Heal to discuss treatment options.  # Comorbidities-mild dementia/history of compensated ischemic cardiomyopathy/CAD history of stroke.- MUGA scan- 65%. Dementia- can have significant impact on the patient's management of his aggressive lymphoma.  # RTC in 1 week/start chemo then; labs. I reviewed the above plan with the patient's son over the phone.

## 2016-08-16 NOTE — Telephone Encounter (Signed)
Per scheduling team, patient's son cnl today's. Pt was D/c from hospital fu/ [facility] and pt has been in rehab since d/c. Per son, pt's rehab facility didn't have the 5/8 appointment down, so the pt's son rescheduled today's apt to next week on 08/23/16.

## 2016-08-16 NOTE — Progress Notes (Deleted)
[place] NOTE  Patient Care Team: Sharyne Peach, MD as PCP - General (Family Medicine) Shela Nevin, MD as Attending Physician (Cardiology) Cammie Sickle, MD as Consulting Physician (Internal Medicine) Christene Lye, MD as Surgeon (General Surgery)  CHIEF COMPLAINTS/PURPOSE OF CONSULTATION:   Oncology History   # MARCH- April 2018[facility] STAGE IV- [EBV positive; R. Ax LN; Dr.Sankar]; ? ABC subtype;POSITIVE FOR MYC GENE-RE-ARRANAGMENT.  FEB 20th 2018- PET Borderline enlarged hypermetabolic lymph nodes ] throughout the neck, chest, abdomen and pelvis, together with hypermetabolic Splenomegaly.   # April 2018- R-CEOP [hx of ICMP; April 2018- EF65%].    # 2014- THROMBOCYTOPENIA [130s-140s] sec to splenomegaly; Feb- 2017- 107/N-Hb [hep B/C/HIV- Neg]; US-moderate splenomegaly. March 2017- BMBx- Megakaryocytosis/ otherwise no obvious dyspoiesis/ or malignancy; no increase in reticulin fibrosis; cytogenetics-n.    # 2017- ? 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   Gary Ferrell 81 y.o.  male Caucasian patient With newly diagnosed mild to moderate dementia and with above history of diffuse B-cell lymphoma- is here to proceed with chemotherapy with RCEOP today.He is alone.   In the interim patient was evaluated by dermatology; had a skin biopsy. Patient has a follow-up appointment next week with dermatology again.  Patient does not recollect seeing dermatologist or having a biopsy. He continues to complain of worsening rash. Complains  of itch. He has been not consistent with steroid topical  ROS: A complete 10 point review of system is done which is negative except mentioned above in history of present illness  MEDICAL HISTORY:  Past Medical History:  Diagnosis Date  . AICD (automatic cardioverter/defibrillator) present   . BPH (benign prostatic hyperplasia)    a. 06/2012 s/p Greenlight Laser Prostatectomy.  . Carotid disease, bilateral ([place])    a. 1990's s/p L CEA;  b. 11/2004 s/p redo L CEA;  c. 06/2013 Carotid U/S: RICA 08-65%, LICA patent.  . Chronic systolic CHF (congestive heart failure) ([place])    a. 04/2013 Echo: EF 35%, inf DK, inflat AK, diast dysfxn, RV HK, mild MR, nmildly dil LA.  Marland Kitchen CINV (chemotherapy-induced nausea and vomiting)   . CKD (chronic kidney disease), stage II   . Coronary artery disease    a. 2007 s/p MI & CABG.  . Dementia   . Diverticulosis    a. 10/2013 colonoscopy (otw nl study).  Marland Kitchen Dyspnea   . GERD (gastroesophageal reflux disease)   . Hyperlipidemia   . Hypertension   . Ischemic cardiomyopathy    a. 04/2013 Echo: EF 35%;  b. 06/2013 s/p SJM Bi-V ICD, ser # [id number].  . Large B-cell lymphoma ([place]) 06/16/2016  . LBBB (left bundle branch block)   . Lung nodules   . Myocardial infarction ([place])   . OSA (obstructive sleep apnea)    a. 12/2013 Sleep study: severe OSA - CPAP-   . PAD (peripheral artery disease) ([place])    a. 1990's s/p Aortobifem bypass.  . Presence of permanent cardiac pacemaker   . Spinal stenosis   . Stricture of urethral meatus  a. 08/2009 s/p dil.  . Stroke [facility])    a. 2007 Periop CABG - no residual.    SURGICAL HISTORY: Past Surgical History:  Procedure Laterality Date  . AORTA - BILATERAL FEMORAL ARTERY BYPASS GRAFT  1992  . APPENDECTOMY  1964  . AXILLARY LYMPH NODE BIOPSY Left 06/16/2016   LARGE B-CELL LYMPHOMA/ AXILLARY LYMPH NODE BIOPSY;  Surgeon: Christene Lye, MD;  Location: [facility] ORS;  Service: General;  Laterality: Left;  . BI-VENTRICULAR IMPLANTABLE  CARDIOVERTER DEFIBRILLATOR N/A 06/25/2013   Procedure: BI-VENTRICULAR IMPLANTABLE CARDIOVERTER DEFIBRILLATOR  (CRT-D);  Surgeon: Evans Lance, MD;  Location: [facility] CATH LAB;  Service: Cardiovascular;  Laterality: N/A;  . BI-VENTRICULAR IMPLANTABLE CARDIOVERTER DEFIBRILLATOR  (CRT-D)  06/25/2013  . BI-VENTRICULAR IMPLANTABLE CARDIOVERTER DEFIBRILLATOR  (CRT-D)  06/25/13   STJ CRTD implanted by Dr Lovena Le  . CARDIAC CATHETERIZATION  2007; 2012   "unsuccessful attempt to put stents in in 2007, had MI, had to have CABG;"  . CAROTID ENDARTERECTOMY Left 1992; 2006  . CHOLECYSTECTOMY  ~ 1996  . COLON SURGERY  2007   herniated intestinal obstruction  . CORONARY ARTERY BYPASS GRAFT  2007   "CABG X3" (06/25/2013)  . GREEN LIGHT LASER TURP (TRANSURETHRAL RESECTION OF PROSTATE N/A 07/02/2012   Procedure: GREEN LIGHT LASER TURP (TRANSURETHRAL RESECTION OF PROSTATE;  Surgeon: Ailene Rud, MD;  Location: [facility] ORS;  Service: Urology;  Laterality: N/A;  . PORTACATH PLACEMENT N/A 07/20/2016   Procedure: INSERTION PORT-A-CATH;  Surgeon: Christene Lye, MD;  Location: [facility] ORS;  Service: General;  Laterality: N/A;  . SKIN GRAFT  2008   "area of colon surgery wouldn't heal; had this graft over area"  . TONSILLECTOMY    . URETHRAL MEATOPLASTY  08/2009  . URETHRAL STRICTURE DILATATION  02/2010; 09/2010  . VENTRAL HERNIA REPAIR  2008   ventral epigastric hernia repair [Other]    SOCIAL HISTORY:  Patient lives in  [place];   Quit smoking 1992;  No alcohol. He lives alone.  Retired Medical illustrator. Social History   Social History  . Marital status: Married    Spouse name: N/A  . Number of children: 1  . Years of education: N/A   Occupational History  . Retired    Social History Main Topics  . Smoking status: Former Smoker    Packs/day: 1.00    Years: 20.00    Types: Cigarettes    Quit date: 04/11/1989  . Smokeless tobacco: Never Used  . Alcohol use No  . Drug use: No  . Sexual activity: No     Other Topics Concern  . Not on file   Social History Narrative  . No narrative on file    FAMILY HISTORY: Family History  Problem Relation Age of Onset  . Pancreatic cancer Father   . Diabetes Brother   . Heart disease Brother   . Colon cancer Maternal Grandmother   . Parkinson's disease Mother   . Lung cancer Brother     stage 4    ALLERGIES:  is allergic to dapsone and gluten meal.  MEDICATIONS:  Current Outpatient Prescriptions  Medication Sig Dispense Refill  . aspirin EC 81 MG tablet Take 81 mg by mouth daily.    . carvedilol (COREG) 3.125 MG tablet TAKE 1 TABLET BY MOUTH 2 (TWO) TIMES DAILY WITH MEALS. 180 tablet 1  . cholecalciferol (VITAMIN D) 1000 UNITS tablet Take 1,000 Units by mouth daily.    Marland Kitchen donepezil (ARICEPT) 5 MG tablet qam  11  .  methylcellulose (ARTIFICIAL TEARS) 1 % ophthalmic solution Place 1-2 drops into both eyes daily as needed (dry eyes).    . Multiple Vitamin (MULTIVITAMIN WITH MINERALS) TABS Take 1 tablet by mouth daily.    . ondansetron (ZOFRAN) 8 MG tablet Take one pill every 8 hours as needed for nasuea/ vomitting. 40 tablet 1  . predniSONE (DELTASONE) 50 MG tablet Take 2 tablets once day x 5 days. START on day of your chemotherapy. Take with food. 10 tablet 4  . prochlorperazine (COMPAZINE) 10 MG tablet Take 1 tablet (10 mg total) by mouth every 6 (six) hours as needed for nausea or vomiting. 40 tablet 1  . rosuvastatin (CRESTOR) 20 MG tablet Take 20 mg by mouth every morning. Reported on 06/16/2015     No current facility-administered medications for this visit.       Marland Kitchen  PHYSICAL EXAMINATION: ECOG PERFORMANCE STATUS: 0 - Asymptomatic  There were no vitals filed for this visit. There were no vitals filed for this visit.   GENERAL: Well-nourished well-developed; Alert, no distress and comfortable.  Accompanied by his son. EYES: no pallor or icterus OROPHARYNX: no thrush or ulceration; good dentition  NECK: supple, no masses  felt LYMPH:  no palpable lymphadenopathy in the cervical,or inguinal regions; LN excision noted  LUNGS: clear to auscultation and  No wheeze or crackles HEART/CVS: regular rate & rhythm and no murmurs; No lower extremity edema ABDOMEN: abdomen soft, non-tender and normal bowel sounds; positive for splenomegaly [4 fingers breath below costal margin] Musculoskeletal:no cyanosis of digits and no clubbing  PSYCH: alert & oriented x 3 with fluent speech NEURO: no focal motor/sensory deficits SKIN:   rash present bilateral upper extremities/Lower extremities- erythematous/central clearing. Blanching [worsened since last visit]; also nape of the neck; buttocks.    LABORATORY DATA:  I have reviewed the data as listed Lab Results  Component Value Date   WBC 3.6 (L) 07/26/2016   HGB 12.4 (L) 07/26/2016   HCT 38.5 (L) 07/26/2016   MCV 82.6 07/26/2016   PLT 131 (L) 07/26/2016    Recent Labs  11/03/15 1012 07/26/16 0824  NA 138 135  K 4.1 4.0  CL 103 99*  CO2 30 32  GLUCOSE 112* 147*  BUN 19 18  CREATININE 0.85 0.79  CALCIUM 8.8* 9.6  GFRNONAA >60 >60  GFRAA >60 >60  PROT  --  5.5*  ALBUMIN  --  3.6  AST  --  28  ALT  --  18  ALKPHOS  --  107  BILITOT  --  1.9*     2 weeks ago;                                                                                                                                                           April  17th/ today.   april24th 2018    April 24th 2018.   RADIOGRAPHIC STUDIES: I have personally reviewed the radiological images as listed and agreed with the findings in the report. Dg Chest Port 1 View  Result Date: 07/20/2016 CLINICAL DATA:  Port placement EXAM: PORTABLE CHEST 1 VIEW COMPARISON:  06/26/2013 chest radiograph. FINDINGS: Right subclavian MediPort courses inferiorly into the superior vena cava, with the tip not well seen on this image due to overlying ICD leads. Three lead left subclavian ICD is noted with lead tips overlying  the right atrium and right ventricle. Median sternotomy wires appear intact. Stable cardiomediastinal silhouette with normal heart size and aortic atherosclerosis. No pneumothorax. Stable chronic blunting of the left costophrenic angle compatible with pleuroparenchymal scarring. No appreciable pleural effusions. No pulmonary edema. No acute consolidative airspace disease. IMPRESSION: 1. Right subclavian MediPort appears well positioned, see comments. No pneumothorax. 2. No active cardiopulmonary disease. 3. Aortic atherosclerosis. Electronically Signed   By: Ilona Sorrel M.D.   On: 07/20/2016 14:50   Dg C-arm 1-60 Min-no Report  Result Date: 07/20/2016 Fluoroscopy was utilized by the requesting physician.  No radiographic interpretation.    ASSESSMENT & PLAN:   No problem-specific Assessment & Plan notes found for this encounter.     Cammie Sickle, MD 08/16/2016 8:20 AM

## 2016-08-23 ENCOUNTER — Inpatient Hospital Stay: Payer: No Typology Code available for payment source

## 2016-08-23 ENCOUNTER — Inpatient Hospital Stay: Payer: No Typology Code available for payment source | Attending: Internal Medicine | Admitting: Internal Medicine

## 2016-08-23 VITALS — BP 131/56 | HR 43 | Temp 99.1°F | Resp 18 | Ht 70.0 in | Wt 141.1 lb

## 2016-08-23 DIAGNOSIS — Z95810 Presence of automatic (implantable) cardiac defibrillator: Secondary | ICD-10-CM | POA: Diagnosis not present

## 2016-08-23 DIAGNOSIS — Z87440 Personal history of urinary (tract) infections: Secondary | ICD-10-CM

## 2016-08-23 DIAGNOSIS — Z87891 Personal history of nicotine dependence: Secondary | ICD-10-CM

## 2016-08-23 DIAGNOSIS — Z951 Presence of aortocoronary bypass graft: Secondary | ICD-10-CM

## 2016-08-23 DIAGNOSIS — N182 Chronic kidney disease, stage 2 (mild): Secondary | ICD-10-CM

## 2016-08-23 DIAGNOSIS — Z7952 Long term (current) use of systemic steroids: Secondary | ICD-10-CM | POA: Insufficient documentation

## 2016-08-23 DIAGNOSIS — I130 Hypertensive heart and chronic kidney disease with heart failure and stage 1 through stage 4 chronic kidney disease, or unspecified chronic kidney disease: Secondary | ICD-10-CM

## 2016-08-23 DIAGNOSIS — Z936 Other artificial openings of urinary tract status: Secondary | ICD-10-CM | POA: Diagnosis not present

## 2016-08-23 DIAGNOSIS — Z8673 Personal history of transient ischemic attack (TIA), and cerebral infarction without residual deficits: Secondary | ICD-10-CM

## 2016-08-23 DIAGNOSIS — R21 Rash and other nonspecific skin eruption: Secondary | ICD-10-CM | POA: Diagnosis not present

## 2016-08-23 DIAGNOSIS — C8584 Other specified types of non-Hodgkin lymphoma, lymph nodes of axilla and upper limb: Secondary | ICD-10-CM

## 2016-08-23 DIAGNOSIS — R339 Retention of urine, unspecified: Secondary | ICD-10-CM

## 2016-08-23 DIAGNOSIS — C8594 Non-Hodgkin lymphoma, unspecified, lymph nodes of axilla and upper limb: Secondary | ICD-10-CM | POA: Diagnosis not present

## 2016-08-23 DIAGNOSIS — D696 Thrombocytopenia, unspecified: Secondary | ICD-10-CM | POA: Diagnosis not present

## 2016-08-23 DIAGNOSIS — R161 Splenomegaly, not elsewhere classified: Secondary | ICD-10-CM | POA: Diagnosis not present

## 2016-08-23 DIAGNOSIS — F0390 Unspecified dementia without behavioral disturbance: Secondary | ICD-10-CM | POA: Diagnosis not present

## 2016-08-23 DIAGNOSIS — E785 Hyperlipidemia, unspecified: Secondary | ICD-10-CM

## 2016-08-23 DIAGNOSIS — I252 Old myocardial infarction: Secondary | ICD-10-CM | POA: Diagnosis not present

## 2016-08-23 DIAGNOSIS — Z79899 Other long term (current) drug therapy: Secondary | ICD-10-CM | POA: Diagnosis not present

## 2016-08-23 DIAGNOSIS — N400 Enlarged prostate without lower urinary tract symptoms: Secondary | ICD-10-CM

## 2016-08-23 DIAGNOSIS — I509 Heart failure, unspecified: Secondary | ICD-10-CM | POA: Diagnosis not present

## 2016-08-23 DIAGNOSIS — Z7982 Long term (current) use of aspirin: Secondary | ICD-10-CM | POA: Diagnosis not present

## 2016-08-23 DIAGNOSIS — K219 Gastro-esophageal reflux disease without esophagitis: Secondary | ICD-10-CM

## 2016-08-23 DIAGNOSIS — I255 Ischemic cardiomyopathy: Secondary | ICD-10-CM

## 2016-08-23 DIAGNOSIS — I2510 Atherosclerotic heart disease of native coronary artery without angina pectoris: Secondary | ICD-10-CM

## 2016-08-23 DIAGNOSIS — L439 Lichen planus, unspecified: Secondary | ICD-10-CM | POA: Diagnosis not present

## 2016-08-23 DIAGNOSIS — Z5112 Encounter for antineoplastic immunotherapy: Secondary | ICD-10-CM | POA: Insufficient documentation

## 2016-08-23 DIAGNOSIS — I4891 Unspecified atrial fibrillation: Secondary | ICD-10-CM | POA: Diagnosis not present

## 2016-08-23 DIAGNOSIS — Z801 Family history of malignant neoplasm of trachea, bronchus and lung: Secondary | ICD-10-CM

## 2016-08-23 DIAGNOSIS — Z800 Family history of malignant neoplasm of digestive organs: Secondary | ICD-10-CM

## 2016-08-23 LAB — COMPREHENSIVE METABOLIC PANEL
ALT: 28 U/L (ref 17–63)
AST: 34 U/L (ref 15–41)
Albumin: 3.2 g/dL — ABNORMAL LOW (ref 3.5–5.0)
Alkaline Phosphatase: 106 U/L (ref 38–126)
Anion gap: 5 (ref 5–15)
BILIRUBIN TOTAL: 1.1 mg/dL (ref 0.3–1.2)
BUN: 20 mg/dL (ref 6–20)
CALCIUM: 9.6 mg/dL (ref 8.9–10.3)
CHLORIDE: 94 mmol/L — AB (ref 101–111)
CO2: 30 mmol/L (ref 22–32)
CREATININE: 0.78 mg/dL (ref 0.61–1.24)
GFR calc Af Amer: >60 mL/min (ref >60)
Glucose, Bld: 130 mg/dL — ABNORMAL HIGH (ref 65–99)
Potassium: 4.2 mmol/L (ref 3.5–5.1)
Sodium: 129 mmol/L — ABNORMAL LOW (ref 135–145)
Total Protein: 5 g/dL — ABNORMAL LOW (ref 6.5–8.1)

## 2016-08-23 LAB — CBC WITH DIFFERENTIAL/PLATELET
Basophils Absolute: 0 10*3/uL (ref 0–0.1)
Basophils Relative: 1 %
EOS ABS: 0 10*3/uL (ref 0–0.7)
EOS PCT: 0 %
HCT: 39.5 % — ABNORMAL LOW (ref 40.0–52.0)
HEMOGLOBIN: 12.9 g/dL — AB (ref 13.0–18.0)
LYMPHS ABS: 1.2 10*3/uL (ref 1.0–3.6)
Lymphocytes Relative: 25 %
MCH: 26.7 pg (ref 26.0–34.0)
MCHC: 32.6 g/dL (ref 32.0–36.0)
MCV: 81.7 fL (ref 80.0–100.0)
MONOS PCT: 19 %
Monocytes Absolute: 1 10*3/uL (ref 0.2–1.0)
NEUTROS PCT: 55 %
Neutro Abs: 2.7 10*3/uL (ref 1.4–6.5)
Platelets: 176 10*3/uL (ref 150–440)
RBC: 4.83 MIL/uL (ref 4.40–5.90)
RDW: 17.4 % — ABNORMAL HIGH (ref 11.5–14.5)
WBC: 5 10*3/uL (ref 3.8–10.6)

## 2016-08-23 LAB — LACTATE DEHYDROGENASE: LDH: 175 U/L (ref 98–192)

## 2016-08-23 NOTE — Assessment & Plan Note (Addendum)
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 Transformed lymphoma;less likely Burkitt's  Based on clinical presentation ].   # I had a long discussion the patient's son regarding the difficult situation- patient's likely has an aggressive malignancy- however in the context of his multiple medical problems [dementia; recent UTI with sepsis; urinary retention with Foley catheter; declining performance status]- and inability to undergo aggressive chemotherapy- that we might have to choose a palliative route of treatment for his lymphoma.  # plan starting rituximab- prednisone cycle #1 tomorrow. Printed instructions down for the nursing home for prednisone. Nurse to contact nursing home regarding prednisone instructions..  Based on response; and tolerance to therapy-  Continue current therapy versus start R-CEOP chemo with cycle #2.   # Skin rash/lichen planus- status post steroids/prednisone. Improved. Discussed with the patient's son / recommend follow-up with dermatology. Also discussed with Dr.Graham.   # Multiple comorbidities- ischemic cardiomyopathy; recent A. Fib; moderate dementia; recent UTI; urinary retention with Foley catheter.  # The above plan of care was discussed with the patient's son in detail. Check CBC CMP today. We'll see patient approximately 10 days.

## 2016-08-23 NOTE — Progress Notes (Signed)
[place] NOTE  Patient Care Team: Sharyne Peach, MD as PCP - General (Family Medicine) Shela Nevin, MD as Attending Physician (Cardiology) Cammie Sickle, MD as Consulting Physician (Internal Medicine) Christene Lye, MD as Surgeon (General Surgery)  CHIEF COMPLAINTS/PURPOSE OF CONSULTATION:   Oncology History   # MARCH- April 2018[facility] STAGE IV- [EBV positive; R. Ax LN; Dr.Sankar]; ? ABC subtype;POSITIVE FOR MYC GENE-RE-ARRANAGMENT.  FEB 20th 2018- PET Borderline enlarged hypermetabolic lymph nodes ] throughout the neck, chest, abdomen and pelvis, together with hypermetabolic Splenomegaly.   # April 2018- R-CEOP [hx of ICMP; April 2018- EF65%].    # 2014- THROMBOCYTOPENIA [130s-140s] sec to splenomegaly; Feb- 2017- 107/N-Hb [hep B/C/HIV- Neg]; US-moderate splenomegaly. March 2017- BMBx- Megakaryocytosis/ otherwise no obvious dyspoiesis/ or malignancy; no increase in reticulin fibrosis; cytogenetics-n.    # 2017- ? 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   Gary Ferrell 81 y.o.  male Caucasian patient With newly diagnosed mild to moderate dementia and with above history of diffuse B-cell lymphoma- is here For follow-up.  I reviewed the extensive notes- from inpatient hospitalization at [facility]; office visits with cardiology and urology. Summarized below  The interim patient's clinical course was complicated by- UTI with sepsis admission the hospital. Noted to have A. Fib. Evaluated by cardiology; poor candidate for  anticoagulation. Currently on aspirin. Noted to have urinary retention-status post Foley catheter on Proscar; followed by urology. Patient had been followed by dermatology for his lichen planus rash- start on prednisone 60 mg a day. He is currently on none.  Patient's skin rash is improved. He denies any fevers or chills. Denies any night sweats. He lost weight about 12 pounds. Systems as of the hospital he is in the rehabilitation. He is working with physical therapy.  ROS: Difficult to assess given patient's dementia. MEDICAL HISTORY:  Past Medical History:  Diagnosis Date  . AICD (automatic cardioverter/defibrillator) present   . BPH (benign prostatic hyperplasia)    a. 06/2012 s/p Greenlight Laser Prostatectomy.  . Carotid disease, bilateral ([place])    a. 1990's s/p L CEA;  b. 11/2004 s/p redo L CEA;  c. 06/2013 Carotid U/S: RICA 09-32%, LICA patent.  . Chronic systolic CHF (congestive heart failure) ([place])    a. 04/2013 Echo: EF 35%, inf DK, inflat AK, diast dysfxn, RV HK, mild MR, nmildly dil LA.  Marland Kitchen CINV (chemotherapy-induced nausea and vomiting)   . CKD (chronic kidney disease), stage II   . Coronary artery disease    a. 2007 s/p MI & CABG.  . Dementia   . Diverticulosis    a. 10/2013 colonoscopy (otw nl study).  Marland Kitchen Dyspnea   . GERD (gastroesophageal reflux disease)   . Hyperlipidemia   . Hypertension   . Ischemic cardiomyopathy    a. 04/2013 Echo: EF 35%;  b. 06/2013 s/p SJM Bi-V ICD, ser # [id number].  . Large B-cell lymphoma ([place]) 06/16/2016  . LBBB (left bundle branch block)   . Lung nodules   . Myocardial infarction ([place])   . OSA (obstructive sleep apnea)  a. 12/2013 Sleep study: severe OSA - CPAP-   . PAD (peripheral artery disease) ([place])    a. 1990's s/p Aortobifem bypass.  . Presence of permanent cardiac pacemaker   . Spinal stenosis   . Stricture of urethral meatus    a. 08/2009 s/p dil.  . Stroke [facility])    a. 2007 Periop CABG - no residual.    SURGICAL HISTORY: Past  Surgical History:  Procedure Laterality Date  . AORTA - BILATERAL FEMORAL ARTERY BYPASS GRAFT  1992  . APPENDECTOMY  1964  . AXILLARY LYMPH NODE BIOPSY Left 06/16/2016   LARGE B-CELL LYMPHOMA/ AXILLARY LYMPH NODE BIOPSY;  Surgeon: Christene Lye, MD;  Location: [facility] ORS;  Service: General;  Laterality: Left;  . BI-VENTRICULAR IMPLANTABLE CARDIOVERTER DEFIBRILLATOR N/A 06/25/2013   Procedure: BI-VENTRICULAR IMPLANTABLE CARDIOVERTER DEFIBRILLATOR  (CRT-D);  Surgeon: Evans Lance, MD;  Location: [facility] CATH LAB;  Service: Cardiovascular;  Laterality: N/A;  . BI-VENTRICULAR IMPLANTABLE CARDIOVERTER DEFIBRILLATOR  (CRT-D)  06/25/2013  . BI-VENTRICULAR IMPLANTABLE CARDIOVERTER DEFIBRILLATOR  (CRT-D)  06/25/13   STJ CRTD implanted by Dr Lovena Le  . CARDIAC CATHETERIZATION  2007; 2012   "unsuccessful attempt to put stents in in 2007, had MI, had to have CABG;"  . CAROTID ENDARTERECTOMY Left 1992; 2006  . CHOLECYSTECTOMY  ~ 1996  . COLON SURGERY  2007   herniated intestinal obstruction  . CORONARY ARTERY BYPASS GRAFT  2007   "CABG X3" (06/25/2013)  . GREEN LIGHT LASER TURP (TRANSURETHRAL RESECTION OF PROSTATE N/A 07/02/2012   Procedure: GREEN LIGHT LASER TURP (TRANSURETHRAL RESECTION OF PROSTATE;  Surgeon: Ailene Rud, MD;  Location: [facility] ORS;  Service: Urology;  Laterality: N/A;  . PORTACATH PLACEMENT N/A 07/20/2016   Procedure: INSERTION PORT-A-CATH;  Surgeon: Christene Lye, MD;  Location: [facility] ORS;  Service: General;  Laterality: N/A;  . SKIN GRAFT  2008   "area of colon surgery wouldn't heal; had this graft over area"  . TONSILLECTOMY    . URETHRAL MEATOPLASTY  08/2009  . URETHRAL STRICTURE DILATATION  02/2010; 09/2010  . VENTRAL HERNIA REPAIR  2008   ventral epigastric hernia repair [Other]    SOCIAL HISTORY:  Patient currently lives at [facility] in [place]. Quit smoking 1992;  No alcohol. .  Retired Medical illustrator.  Social History   Social History  .  Marital status: Married    Spouse name: N/A  . Number of children: 1  . Years of education: N/A   Occupational History  . Retired    Social History Main Topics  . Smoking status: Former Smoker    Packs/day: 1.00    Years: 20.00    Types: Cigarettes    Quit date: 04/11/1989  . Smokeless tobacco: Never Used  . Alcohol use No  . Drug use: No  . Sexual activity: No   Other Topics Concern  . Not on file   Social History Narrative  . No narrative on file    FAMILY HISTORY: Family History  Problem Relation Age of Onset  . Pancreatic cancer Father   . Diabetes Brother   . Heart disease Brother   . Colon cancer Maternal Grandmother   . Parkinson's disease Mother   . Lung cancer Brother        stage 4    ALLERGIES:  is allergic to dapsone and gluten meal.  MEDICATIONS:  Current Outpatient Prescriptions  Medication Sig Dispense Refill  . aspirin EC 81 MG tablet Take 81 mg by mouth daily.    Marland Kitchen  carvedilol (COREG) 3.125 MG tablet TAKE 1 TABLET BY MOUTH 2 (TWO) TIMES DAILY WITH MEALS. 180 tablet 1  . cholecalciferol (VITAMIN D) 1000 UNITS tablet Take 1,000 Units by mouth daily.    . finasteride (PROSCAR) 5 MG tablet Take 5 mg by mouth daily.    . hydrocortisone 2.5 % cream Apply 1 application topically 2 (two) times daily as needed (itching).    . Melatonin 3 MG TABS Take 1 tablet by mouth at bedtime.    . Multiple Vitamin (MULTIVITAMIN WITH MINERALS) TABS Take 1 tablet by mouth daily.    . Mupirocin 2 % KIT Apply 1 application topically as needed.    . rosuvastatin (CRESTOR) 20 MG tablet Take 20 mg by mouth every morning. Reported on 06/16/2015    . tamsulosin (FLOMAX) 0.4 MG CAPS capsule Take 1 capsule by mouth daily.    . vitamin C (ASCORBIC ACID) 250 MG tablet Take 250 mg by mouth daily.    . ondansetron (ZOFRAN) 8 MG tablet Take one pill every 8 hours as needed for nasuea/ vomitting. (Patient not taking: Reported on 08/23/2016) 40 tablet 1  . prochlorperazine (COMPAZINE) 10 MG  tablet Take 1 tablet (10 mg total) by mouth every 6 (six) hours as needed for nausea or vomiting. (Patient not taking: Reported on 08/23/2016) 40 tablet 1   No current facility-administered medications for this visit.       Marland Kitchen  PHYSICAL EXAMINATION: ECOG PERFORMANCE STATUS: 0 - Asymptomatic  Vitals:   08/23/16 1049  BP: (!) 131/56  Pulse: (!) 43  Resp: 18  Temp: 99.1 F (37.3 C)   Filed Weights   08/23/16 1049  Weight: 141 lb 1.5 oz (64 kg)     GENERAL: Well-nourished well-developed; Alert, no distress and comfortable.  Accompanied by his son. EYES: no pallor or icterus OROPHARYNX: no thrush or ulceration; good dentition  NECK: supple, no masses felt LYMPH:  no palpable lymphadenopathy in the cervical,or inguinal regions; LN excision noted  LUNGS: clear to auscultation and  No wheeze or crackles HEART/CVS: regular rate & rhythm and no murmurs; No lower extremity edema ABDOMEN: abdomen soft, non-tender and normal bowel sounds; positive for splenomegaly [4 fingers breath below costal margin] Musculoskeletal:no cyanosis of digits and no clubbing  PSYCH: alert & oriented x 3; Flat affect. NEURO: no focal motor/sensory deficits SKIN: Scaly rash present on the upper extremities/improving. Foley catheter in place.    LABORATORY DATA:  I have reviewed the data as listed Lab Results  Component Value Date   WBC 5.0 08/23/2016   HGB 12.9 (L) 08/23/2016   HCT 39.5 (L) 08/23/2016   MCV 81.7 08/23/2016   PLT 176 08/23/2016    Recent Labs  11/03/15 1012 07/26/16 0824 08/23/16 1148  NA 138 135 129*  K 4.1 4.0 4.2  CL 103 99* 94*  CO2 30 32 30  GLUCOSE 112* 147* 130*  BUN '19 18 20  ' CREATININE 0.85 0.79 0.78  CALCIUM 8.8* 9.6 9.6  GFRNONAA >60 >60 >60  GFRAA >60 >60 >60  PROT  --  5.5* 5.0*  ALBUMIN  --  3.6 3.2*  AST  --  28 34  ALT  --  18 28  ALKPHOS  --  107 106  BILITOT  --  1.9* 1.1       RADIOGRAPHIC STUDIES: I have personally reviewed the radiological  images as listed and agreed with the findings in the report. No results found.  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 Transformed lymphoma;less likely Burkitt's  Based on clinical presentation ].   # I had a long discussion the patient's son regarding the difficult situation- patient's likely has an aggressive malignancy- however in the context of his multiple medical problems [dementia; recent UTI with sepsis; urinary retention with Foley catheter; declining performance status]- and inability to undergo aggressive chemotherapy- that we might have to choose a palliative route of treatment for his lymphoma.  # plan starting rituximab- prednisone cycle #1 tomorrow. Printed instructions down for the nursing home for prednisone. Nurse to contact nursing home regarding prednisone instructions..  Based on response; and tolerance to therapy-  Continue current therapy versus start R-CEOP chemo with cycle #2.   # Skin rash/lichen planus- status post steroids/prednisone. Improved. Discussed with the patient's son / recommend follow-up with dermatology. Also discussed with Dr.Graham.   # Multiple comorbidities- ischemic cardiomyopathy; recent A. Fib; moderate dementia; recent UTI; urinary retention with Foley catheter.  # The above plan of care was discussed with the patient's son in detail. Check CBC CMP today. We'll see patient approximately 10 days.      Cammie Sickle, MD 08/23/2016 2:08 PM

## 2016-08-23 NOTE — Progress Notes (Signed)
Patient presents to clinic today for follow-up. He is accompanied by his son. Pt has a H/o lymphoma. Pt currently residing in rehab-at [facility]. MAR-reconciled from facility records. Pt has an indwelling foley cath. Per son, the cath will not be removed until pt follows up with urology. This apt is approx. 3 weeks from today. Pt very weak. 2 person assist from w/c to scale. Pt is very unstable in gait. High fall risk. Pt is Lethargic and keeps falling asleep in w/c.

## 2016-08-24 ENCOUNTER — Other Ambulatory Visit: Payer: Self-pay | Admitting: *Deleted

## 2016-08-24 ENCOUNTER — Inpatient Hospital Stay: Payer: No Typology Code available for payment source

## 2016-08-24 VITALS — BP 119/68 | HR 80 | Temp 98.7°F | Resp 16

## 2016-08-24 DIAGNOSIS — C8584 Other specified types of non-Hodgkin lymphoma, lymph nodes of axilla and upper limb: Secondary | ICD-10-CM

## 2016-08-24 DIAGNOSIS — Z5112 Encounter for antineoplastic immunotherapy: Secondary | ICD-10-CM | POA: Diagnosis not present

## 2016-08-24 MED ORDER — LIDOCAINE-PRILOCAINE 2.5-2.5 % EX CREA: 1.0000 "application " | TOPICAL_CREAM | CUTANEOUS | 3 refills | Status: DC | PRN

## 2016-08-24 MED ORDER — SODIUM CHLORIDE 0.9 % IV SOLN
Freq: Once | INTRAVENOUS | Status: AC
  Administered 2016-08-24: 10:00:00 via INTRAVENOUS
  Filled 2016-08-24: qty 1000

## 2016-08-24 MED ORDER — SODIUM CHLORIDE 0.9 % IV SOLN
375.0000 mg/m2 | Freq: Once | INTRAVENOUS | Status: AC
  Administered 2016-08-24: 700 mg via INTRAVENOUS
  Filled 2016-08-24: qty 50

## 2016-08-24 MED ORDER — SODIUM CHLORIDE 0.9% FLUSH
10.0000 mL | INTRAVENOUS | Status: DC | PRN
  Administered 2016-08-24: 10 mL
  Filled 2016-08-24: qty 10

## 2016-08-24 MED ORDER — HEPARIN SOD (PORK) LOCK FLUSH 100 UNIT/ML IV SOLN
500.0000 [IU] | Freq: Once | INTRAVENOUS | Status: AC | PRN
  Administered 2016-08-24: 500 [IU]
  Filled 2016-08-24: qty 5

## 2016-08-24 MED ORDER — ACETAMINOPHEN 325 MG PO TABS
650.0000 mg | ORAL_TABLET | Freq: Once | ORAL | Status: AC
  Administered 2016-08-24: 650 mg via ORAL
  Filled 2016-08-24: qty 2

## 2016-08-24 MED ORDER — DIPHENHYDRAMINE HCL 25 MG PO CAPS
50.0000 mg | ORAL_CAPSULE | Freq: Once | ORAL | Status: AC
  Administered 2016-08-24: 50 mg via ORAL
  Filled 2016-08-24: qty 2

## 2016-08-24 MED ORDER — SODIUM CHLORIDE 0.9 % IV SOLN: 375.0000 mg/m2 | Freq: Once | INTRAVENOUS | Status: DC

## 2016-09-06 ENCOUNTER — Inpatient Hospital Stay (HOSPITAL_BASED_OUTPATIENT_CLINIC_OR_DEPARTMENT_OTHER): Payer: No Typology Code available for payment source | Admitting: Internal Medicine

## 2016-09-06 ENCOUNTER — Inpatient Hospital Stay: Payer: No Typology Code available for payment source

## 2016-09-06 VITALS — BP 125/64 | HR 68 | Temp 98.1°F | Resp 18 | Ht 70.0 in | Wt 133.6 lb

## 2016-09-06 DIAGNOSIS — Z801 Family history of malignant neoplasm of trachea, bronchus and lung: Secondary | ICD-10-CM

## 2016-09-06 DIAGNOSIS — I252 Old myocardial infarction: Secondary | ICD-10-CM

## 2016-09-06 DIAGNOSIS — I4891 Unspecified atrial fibrillation: Secondary | ICD-10-CM

## 2016-09-06 DIAGNOSIS — C8594 Non-Hodgkin lymphoma, unspecified, lymph nodes of axilla and upper limb: Secondary | ICD-10-CM | POA: Diagnosis not present

## 2016-09-06 DIAGNOSIS — R339 Retention of urine, unspecified: Secondary | ICD-10-CM | POA: Diagnosis not present

## 2016-09-06 DIAGNOSIS — I255 Ischemic cardiomyopathy: Secondary | ICD-10-CM

## 2016-09-06 DIAGNOSIS — L439 Lichen planus, unspecified: Secondary | ICD-10-CM

## 2016-09-06 DIAGNOSIS — N400 Enlarged prostate without lower urinary tract symptoms: Secondary | ICD-10-CM

## 2016-09-06 DIAGNOSIS — Z87891 Personal history of nicotine dependence: Secondary | ICD-10-CM

## 2016-09-06 DIAGNOSIS — Z5112 Encounter for antineoplastic immunotherapy: Secondary | ICD-10-CM | POA: Diagnosis not present

## 2016-09-06 DIAGNOSIS — K219 Gastro-esophageal reflux disease without esophagitis: Secondary | ICD-10-CM

## 2016-09-06 DIAGNOSIS — Z87440 Personal history of urinary (tract) infections: Secondary | ICD-10-CM

## 2016-09-06 DIAGNOSIS — I2510 Atherosclerotic heart disease of native coronary artery without angina pectoris: Secondary | ICD-10-CM

## 2016-09-06 DIAGNOSIS — Z936 Other artificial openings of urinary tract status: Secondary | ICD-10-CM | POA: Diagnosis not present

## 2016-09-06 DIAGNOSIS — Z951 Presence of aortocoronary bypass graft: Secondary | ICD-10-CM

## 2016-09-06 DIAGNOSIS — F0390 Unspecified dementia without behavioral disturbance: Secondary | ICD-10-CM | POA: Diagnosis not present

## 2016-09-06 DIAGNOSIS — Z800 Family history of malignant neoplasm of digestive organs: Secondary | ICD-10-CM

## 2016-09-06 DIAGNOSIS — I509 Heart failure, unspecified: Secondary | ICD-10-CM | POA: Diagnosis not present

## 2016-09-06 DIAGNOSIS — Z7952 Long term (current) use of systemic steroids: Secondary | ICD-10-CM

## 2016-09-06 DIAGNOSIS — C8584 Other specified types of non-Hodgkin lymphoma, lymph nodes of axilla and upper limb: Secondary | ICD-10-CM

## 2016-09-06 DIAGNOSIS — D696 Thrombocytopenia, unspecified: Secondary | ICD-10-CM | POA: Diagnosis not present

## 2016-09-06 DIAGNOSIS — Z7982 Long term (current) use of aspirin: Secondary | ICD-10-CM

## 2016-09-06 DIAGNOSIS — E785 Hyperlipidemia, unspecified: Secondary | ICD-10-CM | POA: Diagnosis not present

## 2016-09-06 DIAGNOSIS — Z8673 Personal history of transient ischemic attack (TIA), and cerebral infarction without residual deficits: Secondary | ICD-10-CM

## 2016-09-06 DIAGNOSIS — N182 Chronic kidney disease, stage 2 (mild): Secondary | ICD-10-CM

## 2016-09-06 DIAGNOSIS — Z95810 Presence of automatic (implantable) cardiac defibrillator: Secondary | ICD-10-CM

## 2016-09-06 DIAGNOSIS — Z79899 Other long term (current) drug therapy: Secondary | ICD-10-CM

## 2016-09-06 DIAGNOSIS — R161 Splenomegaly, not elsewhere classified: Secondary | ICD-10-CM

## 2016-09-06 DIAGNOSIS — I130 Hypertensive heart and chronic kidney disease with heart failure and stage 1 through stage 4 chronic kidney disease, or unspecified chronic kidney disease: Secondary | ICD-10-CM

## 2016-09-06 LAB — COMPREHENSIVE METABOLIC PANEL
ALT: 21 U/L (ref 17–63)
ANION GAP: 5 (ref 5–15)
AST: 21 U/L (ref 15–41)
Albumin: 3.2 g/dL — ABNORMAL LOW (ref 3.5–5.0)
Alkaline Phosphatase: 102 U/L (ref 38–126)
BILIRUBIN TOTAL: 1.1 mg/dL (ref 0.3–1.2)
BUN: 28 mg/dL — ABNORMAL HIGH (ref 6–20)
CO2: 31 mmol/L (ref 22–32)
Calcium: 9.3 mg/dL (ref 8.9–10.3)
Chloride: 100 mmol/L — ABNORMAL LOW (ref 101–111)
Creatinine, Ser: 1.05 mg/dL (ref 0.61–1.24)
Glucose, Bld: 185 mg/dL — ABNORMAL HIGH (ref 65–99)
POTASSIUM: 4.4 mmol/L (ref 3.5–5.1)
Sodium: 136 mmol/L (ref 135–145)
TOTAL PROTEIN: 5.3 g/dL — AB (ref 6.5–8.1)

## 2016-09-06 LAB — CBC WITH DIFFERENTIAL/PLATELET
BASOS PCT: 1 %
Basophils Absolute: 0.1 10*3/uL (ref 0–0.1)
Eosinophils Absolute: 0 10*3/uL (ref 0–0.7)
Eosinophils Relative: 0 %
HEMATOCRIT: 43 % (ref 40.0–52.0)
Hemoglobin: 14 g/dL (ref 13.0–18.0)
Lymphocytes Relative: 19 %
Lymphs Abs: 1.3 10*3/uL (ref 1.0–3.6)
MCH: 26.7 pg (ref 26.0–34.0)
MCHC: 32.6 g/dL (ref 32.0–36.0)
MCV: 81.9 fL (ref 80.0–100.0)
MONO ABS: 0.5 10*3/uL (ref 0.2–1.0)
MONOS PCT: 8 %
NEUTROS ABS: 4.9 10*3/uL (ref 1.4–6.5)
Neutrophils Relative %: 72 %
Platelets: 141 10*3/uL — ABNORMAL LOW (ref 150–440)
RBC: 5.25 MIL/uL (ref 4.40–5.90)
RDW: 17.7 % — AB (ref 11.5–14.5)
WBC: 6.8 10*3/uL (ref 3.8–10.6)

## 2016-09-06 NOTE — Assessment & Plan Note (Addendum)
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 Transformed lymphoma;less likely Burkitt's  Based on clinical presentation ].   # Patient tolerated Rituxan-prednisone cycle #1 very well without any major side effects. However family still concerned about proceeding with RCEOP- given the general debility/presence of catheter/risk of infections.  # Proceed with cycle #2 of Rituxan-prednisone next week.  If he improves then R-CEOP would be considered with cycle #3.   # Skin rash/lichen planus- status post steroids/prednisone. Significant improvement/resolution of the rash.  # Dementia- Restart Aricept; unlikley cause of pt's rash   # Multiple comorbidities- ischemic cardiomyopathy; recent A. Fib- not on anticoagulation given the risk of falls.- STABLE.  # urinary retention with Foley catheter- awaiting Trial of Void again with [facility].   # Debility/currently getting physical therapy at the rehabilitation. Patient's son concerned about disposition from the rehabilitation. We will try to reach Dr. Lovie Macadamia given the family concerns.  # follow up with X-MD next week- Rituxan-Prednisone.  # follow up with me in June 26th/Lab-MD; R- CEOP.

## 2016-09-06 NOTE — Progress Notes (Signed)
[place] NOTE  Patient Care Team: Sharyne Peach, MD as PCP - General (Family Medicine) Shela Nevin, MD as Attending Physician (Cardiology) Cammie Sickle, MD as Consulting Physician (Internal Medicine) Christene Lye, MD as Surgeon (General Surgery)  CHIEF COMPLAINTS/PURPOSE OF CONSULTATION:   Oncology History   # MARCH- April 2018[facility] STAGE IV- [EBV positive; R. Ax LN; Dr.Sankar]; ? ABC subtype;POSITIVE FOR MYC GENE-RE-ARRANAGMENT.  FEB 20th 2018- PET Borderline enlarged hypermetabolic lymph nodes ] throughout the neck, chest, abdomen and pelvis, together with hypermetabolic Splenomegaly.   # April 2018- R-Pred [hx of ICMP; April 2018- EF65%].    # 2014- THROMBOCYTOPENIA [130s-140s] sec to splenomegaly; Feb- 2017- 107/N-Hb [hep B/C/HIV- Neg]; US-moderate splenomegaly. March 2017- BMBx- Megakaryocytosis/ otherwise no obvious dyspoiesis/ or malignancy; no increase in reticulin fibrosis; cytogenetics-n.    # 2017- ? Mod Splenomegaly; #  # Hypogammaglobinemia/Asymptomatic- incidental   # HTN, HLD, CAD [Dr.Conway] s/p CABG x3, MARCH 2018-Cognitive disturbance [likely Alzheimers; r/o vascular dementia; Dr.Bruke; [facility]]         Large cell lymphoma of axillary lymph nodes ([place])   07/12/2016 Initial Diagnosis    Large cell lymphoma of axillary lymph nodes (HCC)       HISTORY OF PRESENTING ILLNESS: Patient is a poor historian given his mild-moderate dementia.   Gary Ferrell 81 y.o.  male Caucasian patient With newly diagnosed mild to moderate dementia and with above history of diffuse B-cell lymphoma [? transformed]- is here For follow-up.  Patient finally ended up getting rituximab-prednisone approximately 2 weeks ago. Patient tolerated extremely well without any major side effects. Patient's skin rash has completely resolved. No fever no chills.   Patient continues to be in the rehabilitation; He is working with physical  therapy. However as per the son patient's endurance is very limited. Patient's son is concerned about the patient's discharge from the rehabilitation's soon.  Patient failed a trial of voiding of urine with urology. He continues to have the Foley catheter.  ROS: Difficult to assess given patient's dementia. MEDICAL HISTORY:  Past Medical History:  Diagnosis Date  . AICD (automatic cardioverter/defibrillator) present   . BPH (benign prostatic hyperplasia)    a. 06/2012 s/p Greenlight Laser Prostatectomy.  . Carotid disease, bilateral ([place])    a. 1990's s/p L CEA;  b. 11/2004 s/p redo L CEA;  c. 06/2013 Carotid U/S: RICA 70-48%, LICA patent.  . Chronic systolic CHF (congestive heart failure) ([place])    a. 04/2013 Echo: EF 35%, inf DK, inflat AK, diast dysfxn, RV HK, mild MR, nmildly dil LA.  Marland Kitchen CINV (chemotherapy-induced nausea and vomiting)   . CKD (chronic kidney disease), stage II   . Coronary artery disease    a. 2007 s/p MI & CABG.  . Dementia   . Diverticulosis    a. 10/2013 colonoscopy (otw nl study).  Marland Kitchen Dyspnea   . GERD (gastroesophageal reflux disease)   . Hyperlipidemia   . Hypertension   . Ischemic cardiomyopathy    a. 04/2013 Echo: EF 35%;  b. 06/2013 s/p SJM Bi-V ICD, ser # [id number].  . Large B-cell lymphoma ([place]) 06/16/2016  . LBBB (left bundle branch block)   . Lung nodules   . Myocardial infarction ([place])   . OSA (obstructive sleep apnea)    a. 12/2013 Sleep study: severe OSA - CPAP-   . PAD (peripheral artery disease) ([place])    a. 1990's s/p Aortobifem bypass.  . Presence of permanent cardiac pacemaker   .  Spinal stenosis   . Stricture of urethral meatus    a. 08/2009 s/p dil.  . Stroke [facility])    a. 2007 Periop CABG - no residual.    SURGICAL HISTORY: Past Surgical History:  Procedure Laterality Date  . AORTA - BILATERAL FEMORAL ARTERY BYPASS GRAFT  1992  . APPENDECTOMY  1964  . AXILLARY LYMPH NODE BIOPSY Left 06/16/2016   LARGE B-CELL LYMPHOMA/ AXILLARY LYMPH NODE  BIOPSY;  Surgeon: Christene Lye, MD;  Location: [facility] ORS;  Service: General;  Laterality: Left;  . BI-VENTRICULAR IMPLANTABLE CARDIOVERTER DEFIBRILLATOR N/A 06/25/2013   Procedure: BI-VENTRICULAR IMPLANTABLE CARDIOVERTER DEFIBRILLATOR  (CRT-D);  Surgeon: Evans Lance, MD;  Location: [facility] CATH LAB;  Service: Cardiovascular;  Laterality: N/A;  . BI-VENTRICULAR IMPLANTABLE CARDIOVERTER DEFIBRILLATOR  (CRT-D)  06/25/2013  . BI-VENTRICULAR IMPLANTABLE CARDIOVERTER DEFIBRILLATOR  (CRT-D)  06/25/13   STJ CRTD implanted by Dr Lovena Le  . CARDIAC CATHETERIZATION  2007; 2012   "unsuccessful attempt to put stents in in 2007, had MI, had to have CABG;"  . CAROTID ENDARTERECTOMY Left 1992; 2006  . CHOLECYSTECTOMY  ~ 1996  . COLON SURGERY  2007   herniated intestinal obstruction  . CORONARY ARTERY BYPASS GRAFT  2007   "CABG X3" (06/25/2013)  . GREEN LIGHT LASER TURP (TRANSURETHRAL RESECTION OF PROSTATE N/A 07/02/2012   Procedure: GREEN LIGHT LASER TURP (TRANSURETHRAL RESECTION OF PROSTATE;  Surgeon: Ailene Rud, MD;  Location: [facility] ORS;  Service: Urology;  Laterality: N/A;  . PORTACATH PLACEMENT N/A 07/20/2016   Procedure: INSERTION PORT-A-CATH;  Surgeon: Christene Lye, MD;  Location: [facility] ORS;  Service: General;  Laterality: N/A;  . SKIN GRAFT  2008   "area of colon surgery wouldn't heal; had this graft over area"  . TONSILLECTOMY    . URETHRAL MEATOPLASTY  08/2009  . URETHRAL STRICTURE DILATATION  02/2010; 09/2010  . VENTRAL HERNIA REPAIR  2008   ventral epigastric hernia repair [Other]    SOCIAL HISTORY:  [facility] currently lives [facility] [place]. Quit smoking 1992;  No alcohol. .  Retired Medical illustrator.  Social History   Social History  . Marital status: Married    Spouse name: N/A  . Number of children: 1  . Years of education: N/A   Occupational History  . Retired    Social History Main Topics  . Smoking status: Former Smoker    Packs/day:  1.00    Years: 20.00    Types: Cigarettes    Quit date: 04/11/1989  . Smokeless tobacco: Never Used  . Alcohol use No  . Drug use: No  . Sexual activity: No   Other Topics Concern  . Not on file   Social History Narrative  . No narrative on file    FAMILY HISTORY: Family History  Problem Relation Age of Onset  . Pancreatic cancer Father   . Diabetes Brother   . Heart disease Brother   . Colon cancer Maternal Grandmother   . Parkinson's disease Mother   . Lung cancer Brother        stage 4    ALLERGIES:  is allergic to dapsone and gluten meal.  MEDICATIONS:  Current Outpatient Prescriptions  Medication Sig Dispense Refill  . aspirin EC 81 MG tablet Take 81 mg by mouth daily.    . carvedilol (COREG) 3.125 MG tablet TAKE 1 TABLET BY MOUTH 2 (TWO) TIMES DAILY WITH MEALS. 180 tablet 1  . cholecalciferol (VITAMIN D) 1000 UNITS tablet Take 1,000 Units by  mouth daily.    . finasteride (PROSCAR) 5 MG tablet Take 5 mg by mouth daily.    . hydrocortisone 2.5 % cream Apply 1 application topically 2 (two) times daily as needed (itching).    . Melatonin 3 MG TABS Take 1 tablet by mouth at bedtime.    . Multiple Vitamin (MULTIVITAMIN WITH MINERALS) TABS Take 1 tablet by mouth daily.    . Mupirocin 2 % KIT Apply 1 application topically as needed.    . rosuvastatin (CRESTOR) 20 MG tablet Take 20 mg by mouth every morning. Reported on 06/16/2015    . tamsulosin (FLOMAX) 0.4 MG CAPS capsule Take 1 capsule by mouth daily.    . vitamin C (ASCORBIC ACID) 250 MG tablet Take 250 mg by mouth daily.    Marland Kitchen lidocaine-prilocaine (EMLA) cream Apply 1 application topically as needed. Apply to port cath site 1 hour prior to access. (Patient not taking: Reported on 09/06/2016) 30 g 3  . ondansetron (ZOFRAN) 8 MG tablet Take one pill every 8 hours as needed for nasuea/ vomitting. (Patient not taking: Reported on 08/23/2016) 40 tablet 1  . prochlorperazine (COMPAZINE) 10 MG tablet Take 1 tablet (10 mg total) by  mouth every 6 (six) hours as needed for nausea or vomiting. (Patient not taking: Reported on 08/23/2016) 40 tablet 1   No current facility-administered medications for this visit.       Marland Kitchen  PHYSICAL EXAMINATION: ECOG PERFORMANCE STATUS: 0 - Asymptomatic  Vitals:   09/06/16 1455  BP: 125/64  Pulse: 68  Resp: 18  Temp: 98.1 F (36.7 C)   Filed Weights   09/06/16 1455  Weight: 133 lb 9.6 oz (60.6 kg)     GENERAL: Well-nourished well-developed; Alert, no distress and comfortable.  Accompanied by his son.Patient is in a wheelchair.  EYES: no pallor or icterus OROPHARYNX: no thrush or ulceration; good dentition  NECK: supple, no masses felt LYMPH:  no palpable lymphadenopathy in the cervical,or inguinal regions; LN excision noted  LUNGS: clear to auscultation and  No wheeze or crackles HEART/CVS: regular rate & rhythm and no murmurs; No lower extremity edema ABDOMEN: abdomen soft, non-tender and normal bowel sounds; positive for splenomegaly [4 fingers breath below costal margin] Musculoskeletal:no cyanosis of digits and no clubbing  PSYCH: alert & oriented x 3; Flat affect. NEURO: no focal motor/sensory deficits SKIN: Skin rash completely resolved.  Foley catheter in place.    LABORATORY DATA:  I have reviewed the data as listed Lab Results  Component Value Date   WBC 6.8 09/06/2016   HGB 14.0 09/06/2016   HCT 43.0 09/06/2016   MCV 81.9 09/06/2016   PLT 141 (L) 09/06/2016    Recent Labs  07/26/16 0824 08/23/16 1148 09/06/16 1434  NA 135 129* 136  K 4.0 4.2 4.4  CL 99* 94* 100*  CO2 32 30 31  GLUCOSE 147* 130* 185*  BUN 18 20 28*  CREATININE 0.79 0.78 1.05  CALCIUM 9.6 9.6 9.3  GFRNONAA >60 >60 >60  GFRAA >60 >60 >60  PROT 5.5* 5.0* 5.3*  ALBUMIN 3.6 3.2* 3.2*  AST 28 34 21  ALT '18 28 21  ' ALKPHOS 107 106 102  BILITOT 1.9* 1.1 1.1       RADIOGRAPHIC STUDIES: I have personally reviewed the radiological images as listed and agreed with the findings  in the report. No results found.  ASSESSMENT & PLAN:   Large cell lymphoma of axillary lymph nodes ([place]) # STAGE IV- Diffuse large B-cell lymphoma-left  axillary lymph node; Positive for myc gene re-arrangement 8:14 [? Transformed lymphoma;less likely Burkitt's  Based on clinical presentation ].   # Patient tolerated Rituxan-prednisone cycle #1 very well without any major side effects. However family still concerned about proceeding with RCEOP- given the general debility/presence of catheter/risk of infections.  # Proceed with cycle #2 of Rituxan-prednisone next week.  If he improves then R-CEOP would be considered with cycle #3.   # Skin rash/lichen planus- status post steroids/prednisone. Significant improvement/resolution of the rash.  # Dementia- Restart Aricept; unlikley cause of pt's rash   # Multiple comorbidities- ischemic cardiomyopathy; recent A. Fib- not on anticoagulation given the risk of falls.- STABLE.  # urinary retention with Foley catheter- awaiting Trial of Void again with [facility].   # Debility/currently getting physical therapy at the rehabilitation. Patient's son concerned about disposition from the rehabilitation. We will try to reach Dr. Lovie Macadamia given the family concerns.  # follow up with X-MD next week- Rituxan-Prednisone.  # follow up with me in June 26th/Lab-MD; R- CEOP.      Cammie Sickle, MD 09/06/2016 5:41 PM

## 2016-09-13 ENCOUNTER — Inpatient Hospital Stay: Payer: Medicare Other | Admitting: *Deleted

## 2016-09-13 ENCOUNTER — Encounter: Payer: Self-pay | Admitting: Hematology and Oncology

## 2016-09-13 ENCOUNTER — Telehealth: Payer: Self-pay | Admitting: *Deleted

## 2016-09-13 ENCOUNTER — Inpatient Hospital Stay: Payer: Medicare Other | Attending: Hematology and Oncology | Admitting: Hematology and Oncology

## 2016-09-13 ENCOUNTER — Other Ambulatory Visit: Payer: Self-pay | Admitting: Hematology and Oncology

## 2016-09-13 ENCOUNTER — Inpatient Hospital Stay: Payer: Medicare Other

## 2016-09-13 VITALS — BP 114/57 | HR 57 | Temp 97.2°F | Resp 18 | Wt 136.4 lb

## 2016-09-13 DIAGNOSIS — N182 Chronic kidney disease, stage 2 (mild): Secondary | ICD-10-CM | POA: Diagnosis not present

## 2016-09-13 DIAGNOSIS — C8584 Other specified types of non-Hodgkin lymphoma, lymph nodes of axilla and upper limb: Secondary | ICD-10-CM

## 2016-09-13 DIAGNOSIS — R338 Other retention of urine: Secondary | ICD-10-CM | POA: Diagnosis not present

## 2016-09-13 DIAGNOSIS — R21 Rash and other nonspecific skin eruption: Secondary | ICD-10-CM | POA: Insufficient documentation

## 2016-09-13 DIAGNOSIS — F0390 Unspecified dementia without behavioral disturbance: Secondary | ICD-10-CM

## 2016-09-13 DIAGNOSIS — Z5112 Encounter for antineoplastic immunotherapy: Secondary | ICD-10-CM | POA: Insufficient documentation

## 2016-09-13 DIAGNOSIS — C8594 Non-Hodgkin lymphoma, unspecified, lymph nodes of axilla and upper limb: Secondary | ICD-10-CM | POA: Diagnosis not present

## 2016-09-13 DIAGNOSIS — L439 Lichen planus, unspecified: Secondary | ICD-10-CM | POA: Insufficient documentation

## 2016-09-13 DIAGNOSIS — I130 Hypertensive heart and chronic kidney disease with heart failure and stage 1 through stage 4 chronic kidney disease, or unspecified chronic kidney disease: Secondary | ICD-10-CM | POA: Diagnosis not present

## 2016-09-13 DIAGNOSIS — I2510 Atherosclerotic heart disease of native coronary artery without angina pectoris: Secondary | ICD-10-CM | POA: Insufficient documentation

## 2016-09-13 DIAGNOSIS — I509 Heart failure, unspecified: Secondary | ICD-10-CM | POA: Diagnosis not present

## 2016-09-13 DIAGNOSIS — I4891 Unspecified atrial fibrillation: Secondary | ICD-10-CM | POA: Insufficient documentation

## 2016-09-13 DIAGNOSIS — E785 Hyperlipidemia, unspecified: Secondary | ICD-10-CM | POA: Diagnosis not present

## 2016-09-13 DIAGNOSIS — I255 Ischemic cardiomyopathy: Secondary | ICD-10-CM

## 2016-09-13 DIAGNOSIS — N401 Enlarged prostate with lower urinary tract symptoms: Secondary | ICD-10-CM

## 2016-09-13 LAB — COMPREHENSIVE METABOLIC PANEL
ALK PHOS: 107 U/L (ref 38–126)
ALT: 15 U/L — ABNORMAL LOW (ref 17–63)
ANION GAP: 4 — AB (ref 5–15)
AST: 18 U/L (ref 15–41)
Albumin: 3.1 g/dL — ABNORMAL LOW (ref 3.5–5.0)
BILIRUBIN TOTAL: 1 mg/dL (ref 0.3–1.2)
BUN: 18 mg/dL (ref 6–20)
CALCIUM: 8.9 mg/dL (ref 8.9–10.3)
CO2: 30 mmol/L (ref 22–32)
Chloride: 103 mmol/L (ref 101–111)
Creatinine, Ser: 0.79 mg/dL (ref 0.61–1.24)
Glucose, Bld: 157 mg/dL — ABNORMAL HIGH (ref 65–99)
POTASSIUM: 3.9 mmol/L (ref 3.5–5.1)
Sodium: 137 mmol/L (ref 135–145)
TOTAL PROTEIN: 4.9 g/dL — AB (ref 6.5–8.1)

## 2016-09-13 LAB — CBC WITH DIFFERENTIAL/PLATELET
BASOS PCT: 1 %
Basophils Absolute: 0 10*3/uL (ref 0–0.1)
EOS ABS: 0 10*3/uL (ref 0–0.7)
Eosinophils Relative: 0 %
HEMATOCRIT: 37.9 % — AB (ref 40.0–52.0)
HEMOGLOBIN: 12.8 g/dL — AB (ref 13.0–18.0)
LYMPHS ABS: 1.3 10*3/uL (ref 1.0–3.6)
Lymphocytes Relative: 34 %
MCH: 27.6 pg (ref 26.0–34.0)
MCHC: 33.9 g/dL (ref 32.0–36.0)
MCV: 81.6 fL (ref 80.0–100.0)
MONO ABS: 0.2 10*3/uL (ref 0.2–1.0)
MONOS PCT: 6 %
NEUTROS ABS: 2.3 10*3/uL (ref 1.4–6.5)
NEUTROS PCT: 59 %
Platelets: 114 10*3/uL — ABNORMAL LOW (ref 150–440)
RBC: 4.65 MIL/uL (ref 4.40–5.90)
RDW: 17.7 % — AB (ref 11.5–14.5)
WBC: 3.8 10*3/uL (ref 3.8–10.6)

## 2016-09-13 MED ORDER — DIPHENHYDRAMINE HCL 25 MG PO CAPS
50.0000 mg | ORAL_CAPSULE | Freq: Once | ORAL | Status: AC
  Administered 2016-09-13: 50 mg via ORAL
  Filled 2016-09-13: qty 2

## 2016-09-13 MED ORDER — HEPARIN SOD (PORK) LOCK FLUSH 100 UNIT/ML IV SOLN
500.0000 [IU] | Freq: Once | INTRAVENOUS | Status: AC | PRN
  Administered 2016-09-13: 500 [IU]
  Filled 2016-09-13: qty 5

## 2016-09-13 MED ORDER — SODIUM CHLORIDE 0.9% FLUSH
10.0000 mL | INTRAVENOUS | Status: DC | PRN
  Administered 2016-09-13: 10 mL
  Filled 2016-09-13: qty 10

## 2016-09-13 MED ORDER — PREDNISONE 50 MG PO TABS: ORAL_TABLET | ORAL | 0 refills | Status: DC

## 2016-09-13 MED ORDER — SODIUM CHLORIDE 0.9 % IV SOLN
Freq: Once | INTRAVENOUS | Status: AC
  Administered 2016-09-13: 10:00:00 via INTRAVENOUS
  Filled 2016-09-13: qty 1000

## 2016-09-13 MED ORDER — SODIUM CHLORIDE 0.9 % IV SOLN
375.0000 mg/m2 | Freq: Once | INTRAVENOUS | Status: AC
  Administered 2016-09-13: 700 mg via INTRAVENOUS
  Filled 2016-09-13: qty 50

## 2016-09-13 MED ORDER — ACETAMINOPHEN 325 MG PO TABS
650.0000 mg | ORAL_TABLET | Freq: Once | ORAL | Status: AC
  Administered 2016-09-13: 650 mg via ORAL
  Filled 2016-09-13: qty 2

## 2016-09-13 NOTE — Progress Notes (Signed)
[place] NOTE  Patient Care Team: Sharyne Peach, MD as PCP - General (Family Medicine) Shela Nevin, MD as Attending Physician (Cardiology) Cammie Sickle, MD as Consulting Physician (Internal Medicine) Christene Lye, MD as Surgeon (General Surgery)  CHIEF COMPLAINTS/PURPOSE OF CONSULTATION:   Oncology History   # MARCH- April 2018[facility] STAGE IV- [EBV positive; R. Ax LN; Dr.Sankar]; ? ABC subtype;POSITIVE FOR MYC GENE-RE-ARRANAGMENT.  FEB 20th 2018- PET Borderline enlarged hypermetabolic lymph nodes ] throughout the neck, chest, abdomen and pelvis, together with hypermetabolic Splenomegaly.   # April 2018- R-Pred [hx of ICMP; April 2018- EF65%].    # 2014- THROMBOCYTOPENIA [130s-140s] sec to splenomegaly; Feb- 2017- 107/N-Hb [hep B/C/HIV- Neg]; US-moderate splenomegaly. March 2017- BMBx- Megakaryocytosis/ otherwise no obvious dyspoiesis/ or malignancy; no increase in reticulin fibrosis; cytogenetics-n.    # 2017- ? Mod Splenomegaly; #  # Hypogammaglobinemia/Asymptomatic- incidental   # HTN, HLD, CAD [Dr.Conway] s/p CABG x3, MARCH 2018-Cognitive disturbance [likely Alzheimers; r/o vascular dementia; Dr.Bruke; [facility]]         Large cell lymphoma of axillary lymph nodes ([place])   07/12/2016 Initial Diagnosis    Large cell lymphoma of axillary lymph nodes (HCC)       HISTORY OF PRESENTING ILLNESS: Patient is a poor historian given his mild-moderate dementia.   Gary Ferrell 81 y.o.  male Caucasian patient with newly diagnosed mild to moderate dementia and with above history of diffuse B-cell lymphoma [? transformed]- is here For follow-up.  Patient finally ended up getting rituximab-prednisone approximately 2 weeks ago. Patient tolerated extremely well without any major side effects. Patient's skin rash has completely resolved. No fever no chills.   Patient continues to be in the rehabilitation; He is working with physical  therapy. However as per the son patient's endurance is very limited. Patient's son is concerned about the patient's discharge from the rehabilitation's soon.  Patient failed a trial of voiding of urine with urology. He continues to have the Foley catheter.  He last received Rituxan 3 weeks ago on 08/24/2016.  He received prednisone. He tolerated it well. He remains at [facility] for rehabilitation. He continues to have mobility issues. He sees a urologist on Friday regarding his Foley catheter. He is using prednisone and triamcinolone for lichen planus on his arms.  ROS: Difficult to assess given patient's dementia.  MEDICAL HISTORY:  Past Medical History:  Diagnosis Date  . AICD (automatic cardioverter/defibrillator) present   . BPH (benign prostatic hyperplasia)    a. 06/2012 s/p Greenlight Laser Prostatectomy.  . Carotid disease, bilateral ([place])    a. 1990's s/p L CEA;  b. 11/2004 s/p redo L CEA;  c. 06/2013 Carotid U/S: RICA 52-77%, LICA patent.  . Chronic systolic CHF (congestive heart failure) ([place])    a. 04/2013 Echo: EF 35%, inf DK, inflat AK, diast dysfxn, RV HK, mild MR, nmildly dil LA.  Marland Kitchen CINV (chemotherapy-induced nausea and vomiting)   . CKD (chronic kidney disease), stage II   . Coronary artery disease    a. 2007 s/p MI & CABG.  . Dementia   . Diverticulosis    a. 10/2013 colonoscopy (otw nl study).  Marland Kitchen Dyspnea   . GERD (gastroesophageal reflux disease)   . Hyperlipidemia   . Hypertension   . Ischemic cardiomyopathy    a. 04/2013 Echo: EF 35%;  b. 06/2013 s/p SJM Bi-V ICD, ser # [id number].  . Large B-cell lymphoma ([place]) 06/16/2016  . LBBB (left bundle branch block)   .  Lung nodules   . Myocardial infarction ([place])   . OSA (obstructive sleep apnea)    a. 12/2013 Sleep study: severe OSA - CPAP-   . PAD (peripheral artery disease) ([place])    a. 1990's s/p Aortobifem bypass.  . Presence of permanent cardiac pacemaker   . Spinal stenosis   . Stricture of urethral meatus    a.  08/2009 s/p dil.  . Stroke [facility])    a. 2007 Periop CABG - no residual.    SURGICAL HISTORY: Past Surgical History:  Procedure Laterality Date  . AORTA - BILATERAL FEMORAL ARTERY BYPASS GRAFT  1992  . APPENDECTOMY  1964  . AXILLARY LYMPH NODE BIOPSY Left 06/16/2016   LARGE B-CELL LYMPHOMA/ AXILLARY LYMPH NODE BIOPSY;  Surgeon: Christene Lye, MD;  Location: [facility] ORS;  Service: General;  Laterality: Left;  . BI-VENTRICULAR IMPLANTABLE CARDIOVERTER DEFIBRILLATOR N/A 06/25/2013   Procedure: BI-VENTRICULAR IMPLANTABLE CARDIOVERTER DEFIBRILLATOR  (CRT-D);  Surgeon: Evans Lance, MD;  Location: [facility] CATH LAB;  Service: Cardiovascular;  Laterality: N/A;  . BI-VENTRICULAR IMPLANTABLE CARDIOVERTER DEFIBRILLATOR  (CRT-D)  06/25/2013  . BI-VENTRICULAR IMPLANTABLE CARDIOVERTER DEFIBRILLATOR  (CRT-D)  06/25/13   STJ CRTD implanted by Dr Lovena Le  . CARDIAC CATHETERIZATION  2007; 2012   "unsuccessful attempt to put stents in in 2007, had MI, had to have CABG;"  . CAROTID ENDARTERECTOMY Left 1992; 2006  . CHOLECYSTECTOMY  ~ 1996  . COLON SURGERY  2007   herniated intestinal obstruction  . CORONARY ARTERY BYPASS GRAFT  2007   "CABG X3" (06/25/2013)  . GREEN LIGHT LASER TURP (TRANSURETHRAL RESECTION OF PROSTATE N/A 07/02/2012   Procedure: GREEN LIGHT LASER TURP (TRANSURETHRAL RESECTION OF PROSTATE;  Surgeon: Ailene Rud, MD;  Location: [facility] ORS;  Service: Urology;  Laterality: N/A;  . PORTACATH PLACEMENT N/A 07/20/2016   Procedure: INSERTION PORT-A-CATH;  Surgeon: Christene Lye, MD;  Location: [facility] ORS;  Service: General;  Laterality: N/A;  . SKIN GRAFT  2008   "area of colon surgery wouldn't heal; had this graft over area"  . TONSILLECTOMY    . URETHRAL MEATOPLASTY  08/2009  . URETHRAL STRICTURE DILATATION  02/2010; 09/2010  . VENTRAL HERNIA REPAIR  2008   ventral epigastric hernia repair [Other]    SOCIAL HISTORY:  Patient currently lives at [facility] in [place].  Quit smoking 1992;  No alcohol. .  Retired Medical illustrator.  Social History   Social History  . Marital status: Married    Spouse name: N/A  . Number of children: 1  . Years of education: N/A   Occupational History  . Retired    Social History Main Topics  . Smoking status: Former Smoker    Packs/day: 1.00    Years: 20.00    Types: Cigarettes    Quit date: 04/11/1989  . Smokeless tobacco: Never Used  . Alcohol use No  . Drug use: No  . Sexual activity: No   Other Topics Concern  . Not on file   Social History Narrative  . No narrative on file    FAMILY HISTORY: Family History  Problem Relation Age of Onset  . Pancreatic cancer Father   . Diabetes Brother   . Heart disease Brother   . Colon cancer Maternal Grandmother   . Parkinson's disease Mother   . Lung cancer Brother        stage 4    ALLERGIES:  is allergic to dapsone and gluten meal.  MEDICATIONS:  Current Outpatient Prescriptions  Medication Sig Dispense Refill  . aspirin EC 81 MG tablet Take 81 mg by mouth daily.    . carvedilol (COREG) 3.125 MG tablet TAKE 1 TABLET BY MOUTH 2 (TWO) TIMES DAILY WITH MEALS. 180 tablet 1  . cholecalciferol (VITAMIN D) 1000 UNITS tablet Take 1,000 Units by mouth daily.    . finasteride (PROSCAR) 5 MG tablet Take 5 mg by mouth daily.    . hydrocortisone 2.5 % cream Apply 1 application topically 2 (two) times daily as needed (itching).    Marland Kitchen lidocaine-prilocaine (EMLA) cream Apply 1 application topically as needed. Apply to port cath site 1 hour prior to access. 30 g 3  . Melatonin 3 MG TABS Take 1 tablet by mouth at bedtime.    . Multiple Vitamin (MULTIVITAMIN WITH MINERALS) TABS Take 1 tablet by mouth daily.    . Mupirocin 2 % KIT Apply 1 application topically as needed.    . rosuvastatin (CRESTOR) 20 MG tablet Take 20 mg by mouth every morning. Reported on 06/16/2015    . ondansetron (ZOFRAN) 8 MG tablet Take one pill every 8 hours as needed for nasuea/ vomitting. (Patient  not taking: Reported on 08/23/2016) 40 tablet 1  . prochlorperazine (COMPAZINE) 10 MG tablet Take 1 tablet (10 mg total) by mouth every 6 (six) hours as needed for nausea or vomiting. (Patient not taking: Reported on 08/23/2016) 40 tablet 1  . tamsulosin (FLOMAX) 0.4 MG CAPS capsule Take 1 capsule by mouth daily.    . vitamin C (ASCORBIC ACID) 250 MG tablet Take 250 mg by mouth daily.     No current facility-administered medications for this visit.       Marland Kitchen  PHYSICAL EXAMINATION: ECOG PERFORMANCE STATUS: 0 - Asymptomatic  Vitals:   09/13/16 0847  BP: (!) 114/57  Pulse: (!) 57  Resp: 18  Temp: 97.2 F (36.2 C)   Filed Weights   09/13/16 0847  Weight: 136 lb 7 oz (61.9 kg)    GENERAL:  Elderly gentleman sitting comfortably in a wheelchair in the exam room in no acute distress.  He is accompanied by his son, Gary Ferrell. MENTAL STATUS:  Alert and oriented to person, place and time. HEAD:  Thin dark hair.  Normocephalic, atraumatic, face symmetric, no Cushingoid features. EYES:  Blue eyes.  Pupils equal round and reactive to light and accomodation.  No conjunctivitis or scleral icterus. ENT:  Oropharynx clear without lesion.  Tongue normal. Mucous membranes moist.  RESPIRATORY:  Clear to auscultation without rales, wheezes or rhonchi. CARDIOVASCULAR:  Regular rate and rhythm without murmur, rub or gallop. ABDOMEN:  Soft, non-tender, with active bowel sounds, and no hepatomegaly.  Spleen palapble.  No masses. SKIN:  No rashes, ulcers or lesions. EXTREMITIES: No edema, no skin discoloration or tenderness.  No palpable cords. LYMPH NODES: No palpable cervical, supraclavicular, axillary or inguinal adenopathy  NEUROLOGICAL: Unremarkable. PSYCH:  Appropriate.    LABORATORY DATA:  I have reviewed the data as listed Lab Results  Component Value Date   WBC 3.8 09/13/2016   HGB 12.8 (L) 09/13/2016   HCT 37.9 (L) 09/13/2016   MCV 81.6 09/13/2016   PLT 114 (L) 09/13/2016    Recent  Labs  08/23/16 1148 09/06/16 1434 09/13/16 0815  NA 129* 136 137  K 4.2 4.4 3.9  CL 94* 100* 103  CO2 '30 31 30  ' GLUCOSE 130* 185* 157*  BUN 20 28* 18  CREATININE 0.78 1.05 0.79  CALCIUM 9.6 9.3 8.9  GFRNONAA >60 >60 >60  GFRAA >60 >60 >60  PROT 5.0* 5.3* 4.9*  ALBUMIN 3.2* 3.2* 3.1*  AST 34 21 18  ALT 28 21 15*  ALKPHOS 106 102 107  BILITOT 1.1 1.1 1.0       RADIOGRAPHIC STUDIES: I have personally reviewed the radiological images as listed and agreed with the findings in the report. No results found.  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 Transformed lymphoma;less likely Burkitt's  Based on clinical presentation ].   # Patient tolerated Rituxan-prednisone cycle #1 very well without any major side effects. However family still concerned about proceeding with RCEOP- given the general debility/presence of catheter/risk of infections.  # Proceed with cycle #3 of Rituxan-prednisone this week.  If he improves then R-CEOP would be considered with next cycle.   # Skin rash/lichen planus- status post steroids/prednisone. Significant improvement/resolution of the rash.  # Dementia- Restart Aricept; unlikley cause of pt's rash   # Multiple comorbidities- ischemic cardiomyopathy; recent A. Fib- not on anticoagulation given the risk of falls.- STABLE.  # urinary retention with Foley catheter- awaiting Trial of Void again with [facility].   # Debility/currently getting physical therapy at the rehabilitation. Patient's son concerned about disposition from the rehabilitation. We will try to reach Dr. Lovie Macadamia given the family concerns.  # follow up with X-MD next week- Rituxan-Prednisone.  # RTC on 10/03/2016 with Dr Rogue Bussing for labs and treatment (previously scheduled).     Lequita Asal, MD 09/13/2016 9:03 AM

## 2016-09-13 NOTE — Progress Notes (Signed)
Patient currently @ [facility].  He is going to be moving to assisted living in the next few days.  Patient offers no complaints.

## 2016-09-13 NOTE — Telephone Encounter (Signed)
I called and spoke to [facility] nurse taking care of pt and she looked at note card from [place] and the instructions were prednisone 100 mg (50 mg tablets x 2) daily for 5 days and it starts the day he comes here for treatment and it in total 5 days. I have entered a med under his medication list in his chart

## 2016-09-28 ENCOUNTER — Other Ambulatory Visit: Payer: Self-pay | Admitting: *Deleted

## 2016-09-28 DIAGNOSIS — C8584 Other specified types of non-Hodgkin lymphoma, lymph nodes of axilla and upper limb: Secondary | ICD-10-CM

## 2016-10-03 ENCOUNTER — Inpatient Hospital Stay (HOSPITAL_BASED_OUTPATIENT_CLINIC_OR_DEPARTMENT_OTHER): Payer: Medicare Other | Admitting: Internal Medicine

## 2016-10-03 ENCOUNTER — Inpatient Hospital Stay: Payer: Medicare Other

## 2016-10-03 ENCOUNTER — Other Ambulatory Visit: Payer: Medicare Other

## 2016-10-03 VITALS — BP 114/80 | HR 63 | Temp 97.6°F | Resp 20 | Ht 70.0 in | Wt 149.5 lb

## 2016-10-03 DIAGNOSIS — R338 Other retention of urine: Secondary | ICD-10-CM

## 2016-10-03 DIAGNOSIS — I509 Heart failure, unspecified: Secondary | ICD-10-CM

## 2016-10-03 DIAGNOSIS — C8594 Non-Hodgkin lymphoma, unspecified, lymph nodes of axilla and upper limb: Secondary | ICD-10-CM

## 2016-10-03 DIAGNOSIS — I2510 Atherosclerotic heart disease of native coronary artery without angina pectoris: Secondary | ICD-10-CM | POA: Diagnosis not present

## 2016-10-03 DIAGNOSIS — N182 Chronic kidney disease, stage 2 (mild): Secondary | ICD-10-CM | POA: Diagnosis not present

## 2016-10-03 DIAGNOSIS — N401 Enlarged prostate with lower urinary tract symptoms: Secondary | ICD-10-CM | POA: Diagnosis not present

## 2016-10-03 DIAGNOSIS — F0390 Unspecified dementia without behavioral disturbance: Secondary | ICD-10-CM

## 2016-10-03 DIAGNOSIS — I255 Ischemic cardiomyopathy: Secondary | ICD-10-CM

## 2016-10-03 DIAGNOSIS — C8584 Other specified types of non-Hodgkin lymphoma, lymph nodes of axilla and upper limb: Secondary | ICD-10-CM

## 2016-10-03 DIAGNOSIS — I4891 Unspecified atrial fibrillation: Secondary | ICD-10-CM

## 2016-10-03 DIAGNOSIS — E785 Hyperlipidemia, unspecified: Secondary | ICD-10-CM

## 2016-10-03 DIAGNOSIS — Z5112 Encounter for antineoplastic immunotherapy: Secondary | ICD-10-CM | POA: Diagnosis not present

## 2016-10-03 DIAGNOSIS — I130 Hypertensive heart and chronic kidney disease with heart failure and stage 1 through stage 4 chronic kidney disease, or unspecified chronic kidney disease: Secondary | ICD-10-CM

## 2016-10-03 DIAGNOSIS — L439 Lichen planus, unspecified: Secondary | ICD-10-CM | POA: Diagnosis not present

## 2016-10-03 DIAGNOSIS — R21 Rash and other nonspecific skin eruption: Secondary | ICD-10-CM | POA: Diagnosis not present

## 2016-10-03 LAB — CBC WITH DIFFERENTIAL/PLATELET
Basophils Absolute: 0 10*3/uL (ref 0–0.1)
Basophils Relative: 1 %
Eosinophils Absolute: 0 10*3/uL (ref 0–0.7)
Eosinophils Relative: 0 %
HCT: 35.4 % — ABNORMAL LOW (ref 40.0–52.0)
Hemoglobin: 12.1 g/dL — ABNORMAL LOW (ref 13.0–18.0)
Lymphocytes Relative: 40 %
Lymphs Abs: 1.5 10*3/uL (ref 1.0–3.6)
MCH: 28.4 pg (ref 26.0–34.0)
MCHC: 34.2 g/dL (ref 32.0–36.0)
MCV: 83.1 fL (ref 80.0–100.0)
Monocytes Absolute: 0.4 10*3/uL (ref 0.2–1.0)
Monocytes Relative: 12 %
Neutro Abs: 1.8 10*3/uL (ref 1.4–6.5)
Neutrophils Relative %: 47 %
Platelets: 127 10*3/uL — ABNORMAL LOW (ref 150–440)
RBC: 4.26 MIL/uL — ABNORMAL LOW (ref 4.40–5.90)
RDW: 18.9 % — ABNORMAL HIGH (ref 11.5–14.5)
WBC: 3.7 10*3/uL — ABNORMAL LOW (ref 3.8–10.6)

## 2016-10-03 LAB — COMPREHENSIVE METABOLIC PANEL
ALT: 19 U/L (ref 17–63)
AST: 22 U/L (ref 15–41)
Albumin: 3.2 g/dL — ABNORMAL LOW (ref 3.5–5.0)
Alkaline Phosphatase: 106 U/L (ref 38–126)
Anion gap: 6 (ref 5–15)
BUN: 14 mg/dL (ref 6–20)
CO2: 28 mmol/L (ref 22–32)
Calcium: 8.9 mg/dL (ref 8.9–10.3)
Chloride: 104 mmol/L (ref 101–111)
Creatinine, Ser: 0.61 mg/dL (ref 0.61–1.24)
GFR calc Af Amer: >60 mL/min (ref >60)
GFR calc non Af Amer: >60 mL/min (ref >60)
Glucose, Bld: 180 mg/dL — ABNORMAL HIGH (ref 65–99)
Potassium: 3.9 mmol/L (ref 3.5–5.1)
Sodium: 138 mmol/L (ref 135–145)
Total Bilirubin: 1.4 mg/dL — ABNORMAL HIGH (ref 0.3–1.2)
Total Protein: 4.9 g/dL — ABNORMAL LOW (ref 6.5–8.1)

## 2016-10-03 LAB — LACTATE DEHYDROGENASE: LDH: 127 U/L (ref 98–192)

## 2016-10-03 MED ORDER — DIPHENHYDRAMINE HCL 25 MG PO CAPS
50.0000 mg | ORAL_CAPSULE | Freq: Once | ORAL | Status: AC
  Administered 2016-10-03: 50 mg via ORAL
  Filled 2016-10-03: qty 2

## 2016-10-03 MED ORDER — SODIUM CHLORIDE 0.9 % IV SOLN
375.0000 mg/m2 | Freq: Once | INTRAVENOUS | Status: AC
  Administered 2016-10-03: 700 mg via INTRAVENOUS
  Filled 2016-10-03: qty 50

## 2016-10-03 MED ORDER — SODIUM CHLORIDE 0.9 % IV SOLN: 375.0000 mg/m2 | Freq: Once | INTRAVENOUS | Status: DC

## 2016-10-03 MED ORDER — HEPARIN SOD (PORK) LOCK FLUSH 100 UNIT/ML IV SOLN
500.0000 [IU] | Freq: Once | INTRAVENOUS | Status: AC | PRN
  Administered 2016-10-03: 500 [IU]
  Filled 2016-10-03: qty 5

## 2016-10-03 MED ORDER — SODIUM CHLORIDE 0.9 % IV SOLN
Freq: Once | INTRAVENOUS | Status: AC
  Administered 2016-10-03: 11:00:00 via INTRAVENOUS
  Filled 2016-10-03: qty 1000

## 2016-10-03 MED ORDER — LIDOCAINE-PRILOCAINE 2.5-2.5 % EX CREA: 1.0000 "application " | TOPICAL_CREAM | CUTANEOUS | 3 refills | Status: DC | PRN

## 2016-10-03 MED ORDER — ACETAMINOPHEN 325 MG PO TABS
650.0000 mg | ORAL_TABLET | Freq: Once | ORAL | Status: AC
  Administered 2016-10-03: 650 mg via ORAL
  Filled 2016-10-03: qty 2

## 2016-10-03 NOTE — Assessment & Plan Note (Addendum)
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 Transformed lymphoma;less likely Burkitt's  Based on clinical presentation ]. Patient tolerated Rituxan-prednisone cycle # s/p 2 cycles very well without any major side effects. Clinical improvement noted.  # Proceed with cycle #3 of Rituxan-prednisone today;  If he improves then R-CEOP would be considered with cycle #4.   # Skin rash/lichen planus- status post steroids/prednisone. Significant improvement/resolution of the rash.  # dementia stable on Aricept.  # A. Fib-aroxysmal/rate controlled- on aspirin only.  # urinary retention with Foley catheter- awaiting Trial of Void again with [facility] this week    # follow up with me in 3weeks /labs/PET prior.

## 2016-10-03 NOTE — Progress Notes (Signed)
[facility] NOTE  Patient Care Team: Sharyne Peach, MD as PCP - General (Family Medicine) Shela Nevin, MD as Attending Physician (Cardiology) Cammie Sickle, MD as Consulting Physician (Internal Medicine) Christene Lye, MD as Surgeon (General Surgery)  CHIEF COMPLAINTS/PURPOSE OF CONSULTATION:   Oncology History   # MARCH- April 2018[facility] STAGE IV- [EBV positive; R. Ax LN; Dr.Sankar]; ? ABC subtype;POSITIVE FOR MYC GENE-RE-ARRANAGMENT.  FEB 20th 2018- PET Borderline enlarged hypermetabolic lymph nodes ] throughout the neck, chest, abdomen and pelvis, together with hypermetabolic Splenomegaly.   # April 2018- R-Pred [hx of ICMP; April 2018- EF65%].    # 2014- THROMBOCYTOPENIA [130s-140s] sec to splenomegaly; Feb- 2017- 107/N-Hb [hep B/C/HIV- Neg]; US-moderate splenomegaly. March 2017- BMBx- Megakaryocytosis/ otherwise no obvious dyspoiesis/ or malignancy; no increase in reticulin fibrosis; cytogenetics-n.    # 2017- ? Mod Splenomegaly; #  # Hypogammaglobinemia/Asymptomatic- incidental   # HTN, HLD, CAD [Dr.Conway] s/p CABG x3, MARCH 2018-Cognitive disturbance [likely Alzheimers; r/o vascular dementia; Dr.Bruke; [facility]]         Large cell lymphoma of axillary lymph nodes ([place])   07/12/2016 Initial Diagnosis    Large cell lymphoma of axillary lymph nodes (H[facility])       HISTORY OF PRESENTING ILLNESS: Patient is a poor historian given his mild-moderate dementia.   Gary Ferrell 81 y.o.  male Caucasian patient With newly diagnosed mild to moderate dementia and with above history of diffuse B-cell lymphoma [? Transformed] currently status post 2 cycles of rituximab-prednisone is here for follow-up.  Patient has tolerated treatment fairly well; denied having any major side effects. In fact his appetite is improving. His physical endurance is improving. He is currently back at home. His family is living with him. No fever or chills.  Gaining weight. No skin lesions.  Patient failed a trial of voiding of urine with urology. He continues to have the Foley catheter. He is awaiting a repeat evaluation with urology this week.  ROS: Difficult to assess given patient's dementia. MEDICAL HISTORY:  Past Medical History:  Diagnosis Date  . AICD (automatic cardioverter/defibrillator) present   . BPH (benign prostatic hyperplasia)    a. 06/2012 s/p Greenlight Laser Prostatectomy.  . Carotid disease, bilateral ([place])    a. 1990's s/p L CEA;  b. 11/2004 s/p redo L CEA;  c. 06/2013 Carotid U/S: RICA 25-63%, LICA patent.  . Chronic systolic CHF (congestive heart failure) ([place])    a. 04/2013 Echo: EF 35%, inf DK, inflat AK, diast dysfxn, RV HK, mild MR, nmildly dil LA.  Marland Kitchen CINV (chemotherapy-induced nausea and vomiting)   . CKD (chronic kidney disease), stage II   . Coronary artery disease    a. 2007 s/p MI & CABG.  . Dementia   . Diverticulosis    a. 10/2013 colonoscopy (otw nl study).  Marland Kitchen Dyspnea   . GERD (gastroesophageal reflux disease)   . Hyperlipidemia   . Hypertension   . Ischemic cardiomyopathy    a. 04/2013 Echo: EF 35%;  b. 06/2013 s/p SJM Bi-V ICD, ser # [id number].  . Large B-cell lymphoma ([place]) 06/16/2016  . LBBB (left bundle branch block)   . Lung nodules   . Myocardial infarction ([place])   . OSA (obstructive sleep apnea)    a. 12/2013 Sleep study: severe OSA - CPAP-   . PAD (peripheral artery disease) ([place])    a. 1990's s/p Aortobifem bypass.  . Presence of permanent cardiac pacemaker   . Spinal stenosis   .  Stricture of urethral meatus    a. 08/2009 s/p dil.  . Stroke [facility])    a. 2007 Periop CABG - no residual.    SURGICAL HISTORY: Past Surgical History:  Procedure Laterality Date  . AORTA - BILATERAL FEMORAL ARTERY BYPASS GRAFT  1992  . APPENDECTOMY  1964  . AXILLARY LYMPH NODE BIOPSY Left 06/16/2016   LARGE B-CELL LYMPHOMA/ AXILLARY LYMPH NODE BIOPSY;  Surgeon: Christene Lye, MD;  Location: [facility] ORS;   Service: General;  Laterality: Left;  . BI-VENTRICULAR IMPLANTABLE CARDIOVERTER DEFIBRILLATOR N/A 06/25/2013   Procedure: BI-VENTRICULAR IMPLANTABLE CARDIOVERTER DEFIBRILLATOR  (CRT-D);  Surgeon: Evans Lance, MD;  Location: [facility] CATH LAB;  Service: Cardiovascular;  Laterality: N/A;  . BI-VENTRICULAR IMPLANTABLE CARDIOVERTER DEFIBRILLATOR  (CRT-D)  06/25/2013  . BI-VENTRICULAR IMPLANTABLE CARDIOVERTER DEFIBRILLATOR  (CRT-D)  06/25/13   STJ CRTD implanted by Dr Lovena Le  . CARDIAC CATHETERIZATION  2007; 2012   "unsuccessful attempt to put stents in in 2007, had MI, had to have CABG;"  . CAROTID ENDARTERECTOMY Left 1992; 2006  . CHOLECYSTECTOMY  ~ 1996  . COLON SURGERY  2007   herniated intestinal obstruction  . CORONARY ARTERY BYPASS GRAFT  2007   "CABG X3" (06/25/2013)  . GREEN LIGHT LASER TURP (TRANSURETHRAL RESECTION OF PROSTATE N/A 07/02/2012   Procedure: GREEN LIGHT LASER TURP (TRANSURETHRAL RESECTION OF PROSTATE;  Surgeon: Ailene Rud, MD;  Location: [facility] ORS;  Service: Urology;  Laterality: N/A;  . PORTACATH PLACEMENT N/A 07/20/2016   Procedure: INSERTION PORT-A-CATH;  Surgeon: Christene Lye, MD;  Location: [facility] ORS;  Service: General;  Laterality: N/A;  . SKIN GRAFT  2008   "area of colon surgery wouldn't heal; had this graft over area"  . TONSILLECTOMY    . URETHRAL MEATOPLASTY  08/2009  . URETHRAL STRICTURE DILATATION  02/2010; 09/2010  . VENTRAL HERNIA REPAIR  2008   ventral epigastric hernia repair [Other]    SOCIAL HISTORY:  Patient currently lives at [facility] in [place]. Quit smoking 1992;  No alcohol. .  Retired Medical illustrator.  Social History   Social History  . Marital status: Married    Spouse name: N/A  . Number of children: 1  . Years of education: N/A   Occupational History  . Retired    Social History Main Topics  . Smoking status: Former Smoker    Packs/day: 1.00    Years: 20.00    Types: Cigarettes    Quit date: 04/11/1989   . Smokeless tobacco: Never Used  . Alcohol use No  . Drug use: No  . Sexual activity: No   Other Topics Concern  . Not on file   Social History Narrative  . No narrative on file    FAMILY HISTORY: Family History  Problem Relation Age of Onset  . Pancreatic cancer Father   . Diabetes Brother   . Heart disease Brother   . Colon cancer Maternal Grandmother   . Parkinson's disease Mother   . Lung cancer Brother        stage 4    ALLERGIES:  is allergic to dapsone and gluten meal.  MEDICATIONS:  Current Outpatient Prescriptions  Medication Sig Dispense Refill  . aspirin EC 81 MG tablet Take 81 mg by mouth daily.    . carvedilol (COREG) 3.125 MG tablet TAKE 1 TABLET BY MOUTH 2 (TWO) TIMES DAILY WITH MEALS. 180 tablet 1  . cholecalciferol (VITAMIN D) 1000 UNITS tablet Take 1,000 Units by mouth daily.    Marland Kitchen  finasteride (PROSCAR) 5 MG tablet Take 5 mg by mouth daily.    Marland Kitchen lidocaine-prilocaine (EMLA) cream Apply 1 application topically as needed. Apply to port cath site 1 hour prior to access. 30 g 3  . Multiple Vitamin (MULTIVITAMIN WITH MINERALS) TABS Take 1 tablet by mouth daily.    . predniSONE (DELTASONE) 50 MG tablet Pt to take 2 tablets daily for 5 days starting on the day of chemotherapy regimen.   This rx will need to be with each treatment pt has. Pt is a resident of [facility] and the prescription will be sent to his nursing home with each treatment 10 tablet 0  . rosuvastatin (CRESTOR) 20 MG tablet Take 20 mg by mouth every morning. Reported on 06/16/2015    . tamsulosin (FLOMAX) 0.4 MG CAPS capsule Take 1 capsule by mouth daily.    . vitamin C (ASCORBIC ACID) 250 MG tablet Take 250 mg by mouth daily.    . hydrocortisone 2.5 % cream Apply 1 application topically 2 (two) times daily as needed (itching).    . Mupirocin 2 % KIT Apply 1 application topically as needed.    . ondansetron (ZOFRAN) 8 MG tablet Take one pill every 8 hours as needed for nasuea/ vomitting. (Patient  not taking: Reported on 08/23/2016) 40 tablet 1  . prochlorperazine (COMPAZINE) 10 MG tablet Take 1 tablet (10 mg total) by mouth every 6 (six) hours as needed for nausea or vomiting. (Patient not taking: Reported on 08/23/2016) 40 tablet 1   No current facility-administered medications for this visit.       Marland Kitchen  PHYSICAL EXAMINATION: ECOG PERFORMANCE STATUS: 0 - Asymptomatic  Vitals:   10/03/16 0932  BP: 114/80  Pulse: 63  Resp: 20  Temp: 97.6 F (36.4 C)   Filed Weights   10/03/16 0932  Weight: 149 lb 8 oz (67.8 kg)     GENERAL: Well-nourished well-developed; Alert, no distress and comfortable.  Accompanied by his son.Patient is walking himself. EYES: no pallor or icterus OROPHARYNX: no thrush or ulceration; good dentition  NECK: supple, no masses felt LYMPH:  no palpable lymphadenopathy in the cervical,or inguinal regions; LN excision noted  LUNGS: clear to auscultation and  No wheeze or crackles HEART/CVS: regular rate & rhythm and no murmurs; No lower extremity edema ABDOMEN: abdomen soft, non-tender and normal bowel sounds; positive for splenomegaly [4 fingers breath below costal margin] Musculoskeletal:no cyanosis of digits and no clubbing  PSYCH: alert & oriented x 3; Flat affect. NEURO: no focal motor/sensory deficits SKIN: Skin rash completely resolved.  Foley catheter in place.    LABORATORY DATA:  I have reviewed the data as listed Lab Results  Component Value Date   WBC 3.7 (L) 10/03/2016   HGB 12.1 (L) 10/03/2016   HCT 35.4 (L) 10/03/2016   MCV 83.1 10/03/2016   PLT 127 (L) 10/03/2016    Recent Labs  09/06/16 1434 09/13/16 0815 10/03/16 0910  NA 136 137 138  K 4.4 3.9 3.9  CL 100* 103 104  CO2 '31 30 28  ' GLUCOSE 185* 157* 180*  BUN 28* 18 14  CREATININE 1.05 0.79 0.61  CALCIUM 9.3 8.9 8.9  GFRNONAA >60 >60 >60  GFRAA >60 >60 >60  PROT 5.3* 4.9* 4.9*  ALBUMIN 3.2* 3.1* 3.2*  AST '21 18 22  ' ALT 21 15* 19  ALKPHOS 102 107 106  BILITOT 1.1  1.0 1.4*       RADIOGRAPHIC STUDIES: I have personally reviewed the radiological images as  listed and agreed with the findings in the report. No results found.  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 Transformed lymphoma;less likely Burkitt's  Based on clinical presentation ]. Patient tolerated Rituxan-prednisone cycle # s/p 2 cycles very well without any major side effects. Clinical improvement noted.  # Proceed with cycle #3 of Rituxan-prednisone today;  If he improves then R-CEOP would be considered with cycle #4.   # Skin rash/lichen planus- status post steroids/prednisone. Significant improvement/resolution of the rash.  # dementia stable on Aricept.  # A. Fib-aroxysmal/rate controlled- on aspirin only.  # urinary retention with Foley catheter- awaiting Trial of Void again with [facility] this week    # follow up with me in 3weeks /labs/PET prior.      Cammie Sickle, MD 10/03/2016 5:47 PM

## 2016-10-04 ENCOUNTER — Ambulatory Visit: Payer: Medicare Other

## 2016-10-04 ENCOUNTER — Ambulatory Visit: Payer: Medicare Other | Admitting: Internal Medicine

## 2016-10-20 ENCOUNTER — Telehealth: Payer: Self-pay | Admitting: Internal Medicine

## 2016-10-20 ENCOUNTER — Ambulatory Visit
Admission: RE | Admit: 2016-10-20 | Discharge: 2016-10-20 | Disposition: A | Discharge status: Home / Self Care | Payer: Medicare Other | Source: Ambulatory Visit | Attending: Internal Medicine | Admitting: Internal Medicine

## 2016-10-20 DIAGNOSIS — C8584 Other specified types of non-Hodgkin lymphoma, lymph nodes of axilla and upper limb: Secondary | ICD-10-CM

## 2016-10-20 DIAGNOSIS — I517 Cardiomegaly: Secondary | ICD-10-CM | POA: Diagnosis not present

## 2016-10-20 DIAGNOSIS — I2510 Atherosclerotic heart disease of native coronary artery without angina pectoris: Secondary | ICD-10-CM | POA: Insufficient documentation

## 2016-10-20 DIAGNOSIS — I700 Atherosclerosis of aorta: Secondary | ICD-10-CM | POA: Diagnosis not present

## 2016-10-20 DIAGNOSIS — C8594 Non-Hodgkin lymphoma, unspecified, lymph nodes of axilla and upper limb: Secondary | ICD-10-CM | POA: Diagnosis not present

## 2016-10-20 DIAGNOSIS — J90 Pleural effusion, not elsewhere classified: Secondary | ICD-10-CM | POA: Insufficient documentation

## 2016-10-20 DIAGNOSIS — M4856XA Collapsed vertebra, not elsewhere classified, lumbar region, initial encounter for fracture: Secondary | ICD-10-CM | POA: Diagnosis not present

## 2016-10-20 LAB — GLUCOSE, CAPILLARY: GLUCOSE-CAPILLARY: 127 mg/dL — AB (ref 65–99)

## 2016-10-20 MED ORDER — FLUDEOXYGLUCOSE F - 18 (FDG) INJECTION
12.0000 | Freq: Once | INTRAVENOUS | Status: AC | PRN
  Administered 2016-10-20: 12.45 via INTRAVENOUS

## 2016-10-20 NOTE — Telephone Encounter (Signed)
Discussion the patient's son; regarding the results of the scan/provement of lymphoma. Discussed regarding: Uptake/lungs abnormalities. Will discuss at next visit.

## 2016-10-24 ENCOUNTER — Inpatient Hospital Stay: Payer: Medicare Other | Attending: Internal Medicine

## 2016-10-24 ENCOUNTER — Inpatient Hospital Stay: Payer: Medicare Other

## 2016-10-24 ENCOUNTER — Inpatient Hospital Stay (HOSPITAL_BASED_OUTPATIENT_CLINIC_OR_DEPARTMENT_OTHER): Payer: Medicare Other | Admitting: Internal Medicine

## 2016-10-24 VITALS — BP 134/61 | HR 67 | Temp 96.8°F | Resp 16 | Wt 154.0 lb

## 2016-10-24 DIAGNOSIS — G4733 Obstructive sleep apnea (adult) (pediatric): Secondary | ICD-10-CM | POA: Diagnosis not present

## 2016-10-24 DIAGNOSIS — Z8673 Personal history of transient ischemic attack (TIA), and cerebral infarction without residual deficits: Secondary | ICD-10-CM

## 2016-10-24 DIAGNOSIS — N401 Enlarged prostate with lower urinary tract symptoms: Secondary | ICD-10-CM | POA: Insufficient documentation

## 2016-10-24 DIAGNOSIS — R338 Other retention of urine: Secondary | ICD-10-CM

## 2016-10-24 DIAGNOSIS — I255 Ischemic cardiomyopathy: Secondary | ICD-10-CM

## 2016-10-24 DIAGNOSIS — C8338 Diffuse large B-cell lymphoma, lymph nodes of multiple sites: Secondary | ICD-10-CM | POA: Diagnosis not present

## 2016-10-24 DIAGNOSIS — I700 Atherosclerosis of aorta: Secondary | ICD-10-CM

## 2016-10-24 DIAGNOSIS — Z5112 Encounter for antineoplastic immunotherapy: Secondary | ICD-10-CM | POA: Insufficient documentation

## 2016-10-24 DIAGNOSIS — K219 Gastro-esophageal reflux disease without esophagitis: Secondary | ICD-10-CM | POA: Insufficient documentation

## 2016-10-24 DIAGNOSIS — I130 Hypertensive heart and chronic kidney disease with heart failure and stage 1 through stage 4 chronic kidney disease, or unspecified chronic kidney disease: Secondary | ICD-10-CM | POA: Diagnosis not present

## 2016-10-24 DIAGNOSIS — R590 Localized enlarged lymph nodes: Secondary | ICD-10-CM | POA: Insufficient documentation

## 2016-10-24 DIAGNOSIS — Z7982 Long term (current) use of aspirin: Secondary | ICD-10-CM

## 2016-10-24 DIAGNOSIS — Z9989 Dependence on other enabling machines and devices: Secondary | ICD-10-CM | POA: Diagnosis not present

## 2016-10-24 DIAGNOSIS — Z7952 Long term (current) use of systemic steroids: Secondary | ICD-10-CM | POA: Insufficient documentation

## 2016-10-24 DIAGNOSIS — I5022 Chronic systolic (congestive) heart failure: Secondary | ICD-10-CM | POA: Diagnosis not present

## 2016-10-24 DIAGNOSIS — Z79899 Other long term (current) drug therapy: Secondary | ICD-10-CM | POA: Diagnosis not present

## 2016-10-24 DIAGNOSIS — Z801 Family history of malignant neoplasm of trachea, bronchus and lung: Secondary | ICD-10-CM

## 2016-10-24 DIAGNOSIS — Z95810 Presence of automatic (implantable) cardiac defibrillator: Secondary | ICD-10-CM

## 2016-10-24 DIAGNOSIS — C8584 Other specified types of non-Hodgkin lymphoma, lymph nodes of axilla and upper limb: Secondary | ICD-10-CM

## 2016-10-24 DIAGNOSIS — Z951 Presence of aortocoronary bypass graft: Secondary | ICD-10-CM

## 2016-10-24 DIAGNOSIS — I252 Old myocardial infarction: Secondary | ICD-10-CM | POA: Diagnosis not present

## 2016-10-24 DIAGNOSIS — Z87891 Personal history of nicotine dependence: Secondary | ICD-10-CM

## 2016-10-24 DIAGNOSIS — I4891 Unspecified atrial fibrillation: Secondary | ICD-10-CM | POA: Insufficient documentation

## 2016-10-24 DIAGNOSIS — F0390 Unspecified dementia without behavioral disturbance: Secondary | ICD-10-CM

## 2016-10-24 DIAGNOSIS — N182 Chronic kidney disease, stage 2 (mild): Secondary | ICD-10-CM | POA: Diagnosis not present

## 2016-10-24 DIAGNOSIS — Z800 Family history of malignant neoplasm of digestive organs: Secondary | ICD-10-CM

## 2016-10-24 DIAGNOSIS — R161 Splenomegaly, not elsewhere classified: Secondary | ICD-10-CM

## 2016-10-24 DIAGNOSIS — E785 Hyperlipidemia, unspecified: Secondary | ICD-10-CM | POA: Insufficient documentation

## 2016-10-24 DIAGNOSIS — I2510 Atherosclerotic heart disease of native coronary artery without angina pectoris: Secondary | ICD-10-CM | POA: Diagnosis not present

## 2016-10-24 LAB — COMPREHENSIVE METABOLIC PANEL
ALBUMIN: 3.6 g/dL (ref 3.5–5.0)
ALK PHOS: 115 U/L (ref 38–126)
ALT: 17 U/L (ref 17–63)
AST: 21 U/L (ref 15–41)
Anion gap: 3 — ABNORMAL LOW (ref 5–15)
BILIRUBIN TOTAL: 1.6 mg/dL — AB (ref 0.3–1.2)
BUN: 12 mg/dL (ref 6–20)
CALCIUM: 9.1 mg/dL (ref 8.9–10.3)
CO2: 30 mmol/L (ref 22–32)
CREATININE: 0.62 mg/dL (ref 0.61–1.24)
Chloride: 105 mmol/L (ref 101–111)
GFR calc Af Amer: >60 mL/min (ref >60)
GFR calc non Af Amer: >60 mL/min (ref >60)
GLUCOSE: 129 mg/dL — AB (ref 65–99)
Potassium: 4.1 mmol/L (ref 3.5–5.1)
SODIUM: 138 mmol/L (ref 135–145)
TOTAL PROTEIN: 5.3 g/dL — AB (ref 6.5–8.1)

## 2016-10-24 LAB — CBC WITH DIFFERENTIAL/PLATELET
Basophils Absolute: 0 K/uL (ref 0–0.1)
Basophils Relative: 1 %
Eosinophils Absolute: 0 K/uL (ref 0–0.7)
Eosinophils Relative: 0 %
HCT: 36 % — ABNORMAL LOW (ref 40.0–52.0)
Hemoglobin: 12.2 g/dL — ABNORMAL LOW (ref 13.0–18.0)
Lymphocytes Relative: 28 %
Lymphs Abs: 1.3 K/uL (ref 1.0–3.6)
MCH: 28.3 pg (ref 26.0–34.0)
MCHC: 33.9 g/dL (ref 32.0–36.0)
MCV: 83.4 fL (ref 80.0–100.0)
Monocytes Absolute: 0.7 K/uL (ref 0.2–1.0)
Monocytes Relative: 14 %
Neutro Abs: 2.7 K/uL (ref 1.4–6.5)
Neutrophils Relative %: 57 %
Platelets: 147 K/uL — ABNORMAL LOW (ref 150–440)
RBC: 4.32 MIL/uL — ABNORMAL LOW (ref 4.40–5.90)
RDW: 17.4 % — ABNORMAL HIGH (ref 11.5–14.5)
WBC: 4.6 K/uL (ref 3.8–10.6)

## 2016-10-24 LAB — LACTATE DEHYDROGENASE: LDH: 135 U/L (ref 98–192)

## 2016-10-24 MED ORDER — ACETAMINOPHEN 325 MG PO TABS
650.0000 mg | ORAL_TABLET | Freq: Once | ORAL | Status: AC
  Administered 2016-10-24: 650 mg via ORAL
  Filled 2016-10-24: qty 2

## 2016-10-24 MED ORDER — RITUXIMAB CHEMO INJECTION 500 MG/50ML
375.0000 mg/m2 | Freq: Once | INTRAVENOUS | Status: AC
  Administered 2016-10-24: 700 mg via INTRAVENOUS
  Filled 2016-10-24: qty 50

## 2016-10-24 MED ORDER — DIPHENHYDRAMINE HCL 25 MG PO CAPS
25.0000 mg | ORAL_CAPSULE | Freq: Once | ORAL | Status: AC
  Administered 2016-10-24: 25 mg via ORAL
  Filled 2016-10-24: qty 1

## 2016-10-24 MED ORDER — SODIUM CHLORIDE 0.9 % IV SOLN
Freq: Once | INTRAVENOUS | Status: AC
Start: 2016-10-24 — End: 2016-10-24
  Administered 2016-10-24: 10:00:00 via INTRAVENOUS
  Filled 2016-10-24: qty 1000

## 2016-10-24 MED ORDER — PREDNISONE 50 MG PO TABS: ORAL_TABLET | ORAL | 3 refills | Status: DC

## 2016-10-24 MED ORDER — SODIUM CHLORIDE 0.9% FLUSH
10.0000 mL | INTRAVENOUS | Status: DC | PRN
  Filled 2016-10-24: qty 10

## 2016-10-24 MED ORDER — HEPARIN SOD (PORK) LOCK FLUSH 100 UNIT/ML IV SOLN
500.0000 [IU] | Freq: Once | INTRAVENOUS | Status: AC | PRN
  Administered 2016-10-24: 500 [IU]
  Filled 2016-10-24: qty 5

## 2016-10-24 NOTE — Progress Notes (Signed)
[facility] NOTE  Patient Care Team: Sharyne Peach, MD as PCP - General (Family Medicine) Shela Nevin, MD as Attending Physician (Cardiology) Cammie Sickle, MD as Consulting Physician (Internal Medicine) Christene Lye, MD as Surgeon (General Surgery)  CHIEF COMPLAINTS/PURPOSE OF CONSULTATION:   Oncology History   # MARCH- April 2018[facility] STAGE IV- [EBV positive; R. Ax LN; Dr.Sankar]; ? ABC subtype;POSITIVE FOR MYC GENE-RE-ARRANAGMENT.  FEB 20th 2018- PET Borderline enlarged hypermetabolic lymph nodes ] throughout the neck, chest, abdomen and pelvis, together with hypermetabolic Splenomegaly.   # April 2018- R-Pred [hx of ICMP; April 2018- EF65%].    # 2014- THROMBOCYTOPENIA [130s-140s] sec to splenomegaly; Feb- 2017- 107/N-Hb [hep B/C/HIV- Neg]; US-moderate splenomegaly. March 2017- BMBx- Megakaryocytosis/ otherwise no obvious dyspoiesis/ or malignancy; no increase in reticulin fibrosis; cytogenetics-n.    # 2017- ? 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   Gary Ferrell 81 y.o.  male Caucasian patient With newly diagnosed mild to moderate dementia and with above history of diffuse B-cell lymphoma [? Transformed] currently status post 3 cycles of rituximab-prednisone is here for follow-up/And also to review the results of his restaging PET scan.   Patient is currently in nursing home/rehabilitation. In the interim he had his Foley catheter taken out.  No fever or chills. Gaining weight. No skin lesions. Otherwise no nausea no  vomiting. Appetite improving.  ROS: Difficult to assess given patient's dementia. MEDICAL HISTORY:  Past Medical History:  Diagnosis Date  . AICD (automatic cardioverter/defibrillator) present   . BPH (benign prostatic hyperplasia)    a. 06/2012 s/p Greenlight Laser Prostatectomy.  . Carotid disease, bilateral ([place])    a. 1990's s/p L CEA;  b. 11/2004 s/p redo L CEA;  c. 06/2013 Carotid U/S: RICA 91-47%, LICA patent.  . Chronic systolic CHF (congestive heart failure) ([place])    a. 04/2013 Echo: EF 35%, inf DK, inflat AK, diast dysfxn, RV HK, mild MR, nmildly dil LA.  Marland Kitchen CINV (chemotherapy-induced nausea and vomiting)   . CKD (chronic kidney disease), stage II   . Coronary artery disease    a. 2007 s/p MI & CABG.  . Dementia   . Diverticulosis    a. 10/2013 colonoscopy (otw nl study).  Marland Kitchen Dyspnea   . GERD (gastroesophageal reflux disease)   . Hyperlipidemia   . Hypertension   . Ischemic cardiomyopathy    a. 04/2013 Echo: EF 35%;  b. 06/2013 s/p SJM Bi-V ICD, ser # [id number].  . Large B-cell lymphoma ([place]) 06/16/2016  . LBBB (left bundle branch block)   . Lung nodules   . Myocardial infarction ([place])   . OSA (obstructive sleep apnea)    a. 12/2013 Sleep study: severe OSA - CPAP-   . PAD (peripheral artery disease) ([place])    a. 1990's s/p Aortobifem bypass.  . Presence of permanent cardiac pacemaker   . Spinal stenosis   . Stricture of urethral meatus    a. 08/2009 s/p dil.  . Stroke [facility])    a. 2007 Periop CABG - no residual.  SURGICAL HISTORY: Past Surgical History:  Procedure Laterality Date  . AORTA - BILATERAL FEMORAL ARTERY BYPASS GRAFT  1992  . APPENDECTOMY  1964  . AXILLARY LYMPH NODE BIOPSY Left 06/16/2016   LARGE B-CELL LYMPHOMA/ AXILLARY LYMPH NODE BIOPSY;  Surgeon: Christene Lye, MD;  Location: [facility] ORS;  Service: General;  Laterality: Left;  . BI-VENTRICULAR IMPLANTABLE CARDIOVERTER DEFIBRILLATOR N/A 06/25/2013   Procedure: BI-VENTRICULAR IMPLANTABLE CARDIOVERTER  DEFIBRILLATOR  (CRT-D);  Surgeon: Evans Lance, MD;  Location: [facility] CATH LAB;  Service: Cardiovascular;  Laterality: N/A;  . BI-VENTRICULAR IMPLANTABLE CARDIOVERTER DEFIBRILLATOR  (CRT-D)  06/25/2013  . BI-VENTRICULAR IMPLANTABLE CARDIOVERTER DEFIBRILLATOR  (CRT-D)  06/25/13   STJ CRTD implanted by Dr Lovena Le  . CARDIAC CATHETERIZATION  2007; 2012   "unsuccessful attempt to put stents in in 2007, had MI, had to have CABG;"  . CAROTID ENDARTERECTOMY Left 1992; 2006  . CHOLECYSTECTOMY  ~ 1996  . COLON SURGERY  2007   herniated intestinal obstruction  . CORONARY ARTERY BYPASS GRAFT  2007   "CABG X3" (06/25/2013)  . GREEN LIGHT LASER TURP (TRANSURETHRAL RESECTION OF PROSTATE N/A 07/02/2012   Procedure: GREEN LIGHT LASER TURP (TRANSURETHRAL RESECTION OF PROSTATE;  Surgeon: Ailene Rud, MD;  Location: [facility] ORS;  Service: Urology;  Laterality: N/A;  . PORTACATH PLACEMENT N/A 07/20/2016   Procedure: INSERTION PORT-A-CATH;  Surgeon: Christene Lye, MD;  Location: [facility] ORS;  Service: General;  Laterality: N/A;  . SKIN GRAFT  2008   "area of colon surgery wouldn't heal; had this graft over area"  . TONSILLECTOMY    . URETHRAL MEATOPLASTY  08/2009  . URETHRAL STRICTURE DILATATION  02/2010; 09/2010  . VENTRAL HERNIA REPAIR  2008   ventral epigastric hernia repair [Other]    SOCIAL HISTORY:  Patient currently lives [facility] in [place]. Quit smoking 1992;  No alcohol. .  Retired Medical illustrator.  Social History   Social History  . Marital status: Married    Spouse name: N/A  . Number of children: 1  . Years of education: N/A   Occupational History  . Retired    Social History Main Topics  . Smoking status: Former Smoker    Packs/day: 1.00    Years: 20.00    Types: Cigarettes    Quit date: 04/11/1989  . Smokeless tobacco: Never Used  . Alcohol use No  . Drug use: No  . Sexual activity: No   Other Topics Concern  . Not on file   Social History Narrative   . No narrative on file    FAMILY HISTORY: Family History  Problem Relation Age of Onset  . Pancreatic cancer Father   . Diabetes Brother   . Heart disease Brother   . Colon cancer Maternal Grandmother   . Parkinson's disease Mother   . Lung cancer Brother        stage 4    ALLERGIES:  is allergic to dapsone and gluten meal.  MEDICATIONS:  Current Outpatient Prescriptions  Medication Sig Dispense Refill  . aspirin EC 81 MG tablet Take 81 mg by mouth daily.    . carvedilol (COREG) 3.125 MG tablet TAKE 1 TABLET BY MOUTH 2 (TWO) TIMES DAILY WITH MEALS. (Patient taking differently: TAKE 1/2 TABLET BY MOUTH 2 (TWO) TIMES DAILY WITH MEALS.) 180 tablet 1  . cholecalciferol (VITAMIN D) 1000 UNITS tablet Take 1,000 Units by mouth daily.    . finasteride (PROSCAR) 5 MG tablet Take 5 mg by mouth daily.    Marland Kitchen  hydrocortisone 2.5 % cream Apply 1 application topically 2 (two) times daily as needed (itching).    Marland Kitchen lidocaine-prilocaine (EMLA) cream Apply 1 application topically as needed. Apply to port cath site 1 hour prior to access. 30 g 3  . Multiple Vitamin (MULTIVITAMIN WITH MINERALS) TABS Take 1 tablet by mouth daily.    . Mupirocin 2 % KIT Apply 1 application topically as needed.    . ondansetron (ZOFRAN) 8 MG tablet Take one pill every 8 hours as needed for nasuea/ vomitting. 40 tablet 1  . predniSONE (DELTASONE) 50 MG tablet Pt to take 2 tablets daily for 5 days starting on the day of chemotherapy regimen.   This rx will need to be with each treatment. Take in AM with food. 10 tablet 3  . prochlorperazine (COMPAZINE) 10 MG tablet Take 1 tablet (10 mg total) by mouth every 6 (six) hours as needed for nausea or vomiting. 40 tablet 1  . rosuvastatin (CRESTOR) 20 MG tablet Take 20 mg by mouth every morning. Reported on 06/16/2015    . tamsulosin (FLOMAX) 0.4 MG CAPS capsule Take 1 capsule by mouth daily.    . vitamin C (ASCORBIC ACID) 250 MG tablet Take 250 mg by mouth daily.     No current  facility-administered medications for this visit.       Marland Kitchen  PHYSICAL EXAMINATION: ECOG PERFORMANCE STATUS: 0 - Asymptomatic  Vitals:   10/24/16 0907  BP: 134/61  Pulse: 67  Resp: 16  Temp: (!) 96.8 F (36 C)   Filed Weights   10/24/16 0907  Weight: 154 lb (69.9 kg)     GENERAL: Well-nourished well-developed; Alert, no distress and comfortable.  Accompanied by his son/daughter-in-law.Patient is walking himself. EYES: no pallor or icterus OROPHARYNX: no thrush or ulceration; good dentition  NECK: supple, no masses felt LYMPH:  no palpable lymphadenopathy in the cervical,or inguinal regions; LN excision noted  LUNGS: clear to auscultation and  No wheeze or crackles HEART/CVS: regular rate & rhythm and no murmurs; No lower extremity edema ABDOMEN: abdomen soft, non-tender and normal bowel sounds; positive for splenomegaly.  Musculoskeletal:no cyanosis of digits and no clubbing  PSYCH: alert & oriented x 3; Flat affect. NEURO: no focal motor/sensory deficits SKIN: Skin rash completely resolved.      LABORATORY DATA:  I have reviewed the data as listed Lab Results  Component Value Date   WBC 4.6 10/24/2016   HGB 12.2 (L) 10/24/2016   HCT 36.0 (L) 10/24/2016   MCV 83.4 10/24/2016   PLT 147 (L) 10/24/2016    Recent Labs  09/13/16 0815 10/03/16 0910 10/24/16 0842  NA 137 138 138  K 3.9 3.9 4.1  CL 103 104 105  CO2 _0 GLUCOSE 157* 180* 129*  BUN _1 CREATININE 0.79 0.61 0.62  CALCIUM 8.9 8.9 9.1  GFRNONAA >60 >60 >60  GFRAA >60 >60 >60  PROT 4.9* 4.9* 5.3*  ALBUMIN 3.1* 3.2* 3.6  AST _2 ALT 15* 19 17  ALKPHOS 107 106 115  BILITOT 1.0 1.4* 1.6*     IMPRESSION: 1. Marked improvement and in most regions complete resolution of the prior adenopathy in the neck, chest, abdomen, and pelvis. Most of these regions are Deauville 1. A subcarinal lymph node is Deauville 3 but is now normal in size. 2. Hypermetabolic activity in the cecum is  present. Whereas on the prior exam this may have resembled physiologic activity, on today' s follow up PET  this exact region appears to persist, and there is some increasing conspicuity of wall thickening along the cecum raising concern for colon cancer. Colonoscopy with special attention to the cecum is recommended. 3. Scattered lung nodules and regions of what appear to be rounded atelectasis in both lungs, mostly new compared to the prior exam, some of the nodularity might be attributable to atypical infectious process. I am skeptical that this is due to malignancy given the very minimal metabolic activity, but surveillance is likely warranted. 4. Mild splenomegaly but the hypermetabolic activity diffusely in the spleen on the prior exam has resolved. 5. Aortic Atherosclerosis (ICD10-I70.0). Coronary atherosclerosis with mild cardiomegaly. 6. Pleural calcifications on the right, with small right pleural effusion. 7.  Prominent stool throughout the colon favors constipation. 8. New superior endplate compression fracture at L4, likely benign.   Electronically Signed   By: Van Clines M.D.   On: 10/20/2016 10:09   RADIOGRAPHIC STUDIES: I have personally reviewed the radiological images as listed and agreed with the findings in the report. Nm Pet Image Restag (ps) Skull Base To Thigh  Result Date: 10/20/2016 CLINICAL DATA:  Subsequent treatment strategy for large-cell lymphoma. EXAM: NUCLEAR MEDICINE PET SKULL BASE TO THIGH TECHNIQUE: 12.5 mCi F-18 FDG was injected intravenously. Full-ring PET imaging was performed from the skull base to thigh after the radiotracer. CT data was obtained and used for attenuation correction and anatomic localization. FASTING BLOOD GLUCOSE:  Value: 127 mg/dl COMPARISON:  Multiple exams, including 05/31/2016 FINDINGS: NECK The prior adenopathy and hypermetabolic activity in the neck has resolved (Deauville 1). Low-grade activity along the left  posterior nasopharynx was not present previously and currently has maximum SUV 5.2, this could be inflammatory. CHEST Previously hypermetabolic and pathologically enlarged mediastinal, hilar, left infrahilar, and axillary adenopathy is no longer hypermetabolic and for the most part has resolved. The subcarinal node previously measured 1.6 cm in short axis and currently measures 0.9 cm in short axis on image 92 of series 3, maximum SUV in this node is 3.0 (formerly 16.6). Background mediastinal blood pool activity 2.1. A 1.8 by 1.0 cm new right upper lobe pulmonary nodule has maximum SUV 2.0. Small right pleural effusion. Nodular 3.3 by 1.5 cm density peripherally in the left lower lobe has associated volume loss and is suspicious for rounded atelectasis, and is not hypermetabolic. New lingular density potentially also representing round atelectasis measuring 2.0 by 1.5 cm on image 108/3 is observed, maximum SUV 3.1. A right infrahilar nodule measuring 2.6 by 1.8 cm on image 96/3 has maximum SUV 2.4. A new 0.9 by 0.8 cm right middle lobe nodule on image 109/3 is not hypermetabolic. Several other smaller scattered nodular opacities are present in both lungs. There is also old granulomatous disease on the left. Coronary, aortic arch, and branch vessel atherosclerotic vascular disease. Prior CABG. Mild cardiomegaly. Calcification along the right pleural effusion on image 120/3. ABDOMEN/PELVIS Focal hypermetabolic activity in the cecum associated with wall thickening, maximum SUV 11.5, formerly 10.6. Cholecystectomy. Prominent stool throughout the colon favors constipation. No hypermetabolic activity in the spleen, which measures 10.1 by 6.4 by 14.5 cm (volume = 490 cm^3). Aortoiliac atherosclerotic vascular disease.  Aortobifemoral graft. Prior porta hepatis, retroperitoneal, and pelvic hypermetabolic adenopathy has resolved (Deauville 1). SKELETON No focal hypermetabolic activity to suggest skeletal metastasis. New  superior endplate compression fracture at L4. IMPRESSION: 1. Marked improvement and in most regions complete resolution of the prior adenopathy in the neck, chest, abdomen, and pelvis. Most of these regions are  Deauville 1. A subcarinal lymph node is Deauville 3 but is now normal in size. 2. Hypermetabolic activity in the cecum is present. Whereas on the prior exam this may have resembled physiologic activity, on today' s follow up PET this exact region appears to persist, and there is some increasing conspicuity of wall thickening along the cecum raising concern for colon cancer. Colonoscopy with special attention to the cecum is recommended. 3. Scattered lung nodules and regions of what appear to be rounded atelectasis in both lungs, mostly new compared to the prior exam, some of the nodularity might be attributable to atypical infectious process. I am skeptical that this is due to malignancy given the very minimal metabolic activity, but surveillance is likely warranted. 4. Mild splenomegaly but the hypermetabolic activity diffusely in the spleen on the prior exam has resolved. 5. Aortic Atherosclerosis (ICD10-I70.0). Coronary atherosclerosis with mild cardiomegaly. 6. Pleural calcifications on the right, with small right pleural effusion. 7.  Prominent stool throughout the colon favors constipation. 8. New superior endplate compression fracture at L4, likely benign. Electronically Signed   By: Van Clines M.D.   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 Transformed lymphoma;less likely Burkitt's  Based on clinical presentation ]. Patient tolerated Rituxan-prednisone cycle # s/p 3 cycles very well without any major side effects. July 2018- PET scan- significant improvement of axillary/mediastinal adenopathy; splenomegaly. Clinical  improvement noted.  # Proceed with cycle #4 of Rituxan-prednisone today; labs are adequate.  # Caecum uptake-question polyp versus primary colon malignancy. Discussed with Dr.Sankar; also discussed the family.  # Skin rash/lichen planus- status post steroids/prednisone. Significant improvement/resolution of the rash.  # dementia stable on Aricept.  # A. Fib-aroxysmal/rate controlled- on aspirin only.  # urinary retention discontinuation of Foley catheter.  # follow up with me in 3weeks /labs. Prescription for prednisone 100 mg a day for 5 days given.  # I reviewed the blood work- with the patient in detail; also reviewed the imaging independently [as summarized above]; and with the patient in detail.       Cammie Sickle, MD 10/24/2016 5:12 PM

## 2016-10-24 NOTE — Assessment & Plan Note (Addendum)
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 Transformed lymphoma;less likely Burkitt's  Based on clinical presentation ]. Patient tolerated Rituxan-prednisone cycle # s/p 3 cycles very well without any major side effects. July 2018- PET scan- significant improvement of axillary/mediastinal adenopathy; splenomegaly. Clinical improvement noted.  # Proceed with cycle #4 of Rituxan-prednisone today; labs are adequate.  # Caecum uptake-question polyp versus primary colon malignancy. Discussed with Dr.Sankar; also discussed the family.  # Skin rash/lichen planus- status post steroids/prednisone. Significant improvement/resolution of the rash.  # dementia stable on Aricept.  # A. Fib-aroxysmal/rate controlled- on aspirin only.  # urinary retention discontinuation of Foley catheter.  # follow up with me in 3weeks /labs. Prescription for prednisone 100 mg a day for 5 days given.  # I reviewed the blood work- with the patient in detail; also reviewed the imaging independently [as summarized above]; and with the patient in detail.

## 2016-10-24 NOTE — Progress Notes (Signed)
Patient's family has noticed left eye puffiness since he has started eating gluten again 2 days ago.

## 2016-10-27 ENCOUNTER — Telehealth: Payer: Self-pay | Admitting: *Deleted

## 2016-10-27 NOTE — Telephone Encounter (Signed)
Yes, patient was provided with instructions to start it on Monday. However, if patient has not started he needs to start this today.

## 2016-10-27 NOTE — Telephone Encounter (Signed)
Called for order clarification, She states patient was to have Prednisone dose change on Monday, but this did not take place.Asking if he should start this today. Also needs upcoming appt dates. Please advise

## 2016-10-27 NOTE — Telephone Encounter (Signed)
But he did NOT start it Monday as was planned, so should he start it today?

## 2016-10-27 NOTE — Telephone Encounter (Signed)
Per Dr. Jacinto Reap- He recommended pt to start new dose of prednisone 100 mg a day x 5 days.  Pt already has prednisone prescriptions.  Pt's next apt is on 8/6

## 2016-10-27 NOTE — Telephone Encounter (Signed)
Call returned to [place] with order to start Prednisone today since he did not on Monday. She will send order to be signed to [place] and she will be sure he starts it to day

## 2016-10-31 ENCOUNTER — Encounter: Payer: Medicare Other | Admitting: General Surgery

## 2016-11-14 ENCOUNTER — Inpatient Hospital Stay (HOSPITAL_BASED_OUTPATIENT_CLINIC_OR_DEPARTMENT_OTHER): Payer: Medicare Other | Admitting: Internal Medicine

## 2016-11-14 ENCOUNTER — Inpatient Hospital Stay: Payer: Medicare Other | Attending: Internal Medicine

## 2016-11-14 ENCOUNTER — Inpatient Hospital Stay: Payer: Medicare Other

## 2016-11-14 VITALS — BP 145/68 | HR 64 | Temp 97.4°F | Wt 156.0 lb

## 2016-11-14 DIAGNOSIS — G4733 Obstructive sleep apnea (adult) (pediatric): Secondary | ICD-10-CM | POA: Diagnosis not present

## 2016-11-14 DIAGNOSIS — I130 Hypertensive heart and chronic kidney disease with heart failure and stage 1 through stage 4 chronic kidney disease, or unspecified chronic kidney disease: Secondary | ICD-10-CM | POA: Insufficient documentation

## 2016-11-14 DIAGNOSIS — R161 Splenomegaly, not elsewhere classified: Secondary | ICD-10-CM

## 2016-11-14 DIAGNOSIS — C8584 Other specified types of non-Hodgkin lymphoma, lymph nodes of axilla and upper limb: Secondary | ICD-10-CM

## 2016-11-14 DIAGNOSIS — R1310 Dysphagia, unspecified: Secondary | ICD-10-CM

## 2016-11-14 DIAGNOSIS — Z1211 Encounter for screening for malignant neoplasm of colon: Secondary | ICD-10-CM

## 2016-11-14 DIAGNOSIS — I252 Old myocardial infarction: Secondary | ICD-10-CM | POA: Insufficient documentation

## 2016-11-14 DIAGNOSIS — F0390 Unspecified dementia without behavioral disturbance: Secondary | ICD-10-CM | POA: Insufficient documentation

## 2016-11-14 DIAGNOSIS — C8338 Diffuse large B-cell lymphoma, lymph nodes of multiple sites: Secondary | ICD-10-CM

## 2016-11-14 DIAGNOSIS — Z8673 Personal history of transient ischemic attack (TIA), and cerebral infarction without residual deficits: Secondary | ICD-10-CM

## 2016-11-14 DIAGNOSIS — C8334 Diffuse large B-cell lymphoma, lymph nodes of axilla and upper limb: Secondary | ICD-10-CM | POA: Diagnosis not present

## 2016-11-14 DIAGNOSIS — Z951 Presence of aortocoronary bypass graft: Secondary | ICD-10-CM | POA: Diagnosis not present

## 2016-11-14 DIAGNOSIS — I5022 Chronic systolic (congestive) heart failure: Secondary | ICD-10-CM

## 2016-11-14 DIAGNOSIS — Z801 Family history of malignant neoplasm of trachea, bronchus and lung: Secondary | ICD-10-CM | POA: Insufficient documentation

## 2016-11-14 DIAGNOSIS — Z7952 Long term (current) use of systemic steroids: Secondary | ICD-10-CM | POA: Insufficient documentation

## 2016-11-14 DIAGNOSIS — B370 Candidal stomatitis: Secondary | ICD-10-CM | POA: Insufficient documentation

## 2016-11-14 DIAGNOSIS — N400 Enlarged prostate without lower urinary tract symptoms: Secondary | ICD-10-CM

## 2016-11-14 DIAGNOSIS — I700 Atherosclerosis of aorta: Secondary | ICD-10-CM | POA: Insufficient documentation

## 2016-11-14 DIAGNOSIS — I4891 Unspecified atrial fibrillation: Secondary | ICD-10-CM | POA: Diagnosis not present

## 2016-11-14 DIAGNOSIS — I2510 Atherosclerotic heart disease of native coronary artery without angina pectoris: Secondary | ICD-10-CM

## 2016-11-14 DIAGNOSIS — D801 Nonfamilial hypogammaglobulinemia: Secondary | ICD-10-CM | POA: Insufficient documentation

## 2016-11-14 DIAGNOSIS — K219 Gastro-esophageal reflux disease without esophagitis: Secondary | ICD-10-CM | POA: Diagnosis not present

## 2016-11-14 DIAGNOSIS — Z5112 Encounter for antineoplastic immunotherapy: Secondary | ICD-10-CM | POA: Insufficient documentation

## 2016-11-14 DIAGNOSIS — Z800 Family history of malignant neoplasm of digestive organs: Secondary | ICD-10-CM | POA: Insufficient documentation

## 2016-11-14 DIAGNOSIS — E785 Hyperlipidemia, unspecified: Secondary | ICD-10-CM

## 2016-11-14 DIAGNOSIS — Z950 Presence of cardiac pacemaker: Secondary | ICD-10-CM

## 2016-11-14 DIAGNOSIS — R918 Other nonspecific abnormal finding of lung field: Secondary | ICD-10-CM | POA: Diagnosis not present

## 2016-11-14 DIAGNOSIS — Z87891 Personal history of nicotine dependence: Secondary | ICD-10-CM

## 2016-11-14 DIAGNOSIS — Z8041 Family history of malignant neoplasm of ovary: Secondary | ICD-10-CM

## 2016-11-14 DIAGNOSIS — Z79899 Other long term (current) drug therapy: Secondary | ICD-10-CM | POA: Diagnosis not present

## 2016-11-14 DIAGNOSIS — I255 Ischemic cardiomyopathy: Secondary | ICD-10-CM

## 2016-11-14 DIAGNOSIS — Z7982 Long term (current) use of aspirin: Secondary | ICD-10-CM | POA: Insufficient documentation

## 2016-11-14 DIAGNOSIS — N182 Chronic kidney disease, stage 2 (mild): Secondary | ICD-10-CM | POA: Diagnosis not present

## 2016-11-14 DIAGNOSIS — Z95810 Presence of automatic (implantable) cardiac defibrillator: Secondary | ICD-10-CM | POA: Insufficient documentation

## 2016-11-14 LAB — CBC WITH DIFFERENTIAL/PLATELET
BASOS PCT: 1 %
Basophils Absolute: 0 10*3/uL (ref 0–0.1)
EOS ABS: 0 10*3/uL (ref 0–0.7)
Eosinophils Relative: 0 %
HEMATOCRIT: 34.4 % — AB (ref 40.0–52.0)
Hemoglobin: 11.9 g/dL — ABNORMAL LOW (ref 13.0–18.0)
Lymphocytes Relative: 24 %
Lymphs Abs: 1.2 10*3/uL (ref 1.0–3.6)
MCH: 28.7 pg (ref 26.0–34.0)
MCHC: 34.6 g/dL (ref 32.0–36.0)
MCV: 82.8 fL (ref 80.0–100.0)
MONO ABS: 0.7 10*3/uL (ref 0.2–1.0)
MONOS PCT: 14 %
NEUTROS ABS: 3 10*3/uL (ref 1.4–6.5)
Neutrophils Relative %: 61 %
Platelets: 162 10*3/uL (ref 150–440)
RBC: 4.15 MIL/uL — ABNORMAL LOW (ref 4.40–5.90)
RDW: 15.7 % — AB (ref 11.5–14.5)
WBC: 5 10*3/uL (ref 3.8–10.6)

## 2016-11-14 LAB — COMPREHENSIVE METABOLIC PANEL
ALT: 15 U/L — ABNORMAL LOW (ref 17–63)
ANION GAP: 7 (ref 5–15)
AST: 18 U/L (ref 15–41)
Albumin: 3.5 g/dL (ref 3.5–5.0)
Alkaline Phosphatase: 94 U/L (ref 38–126)
BUN: 15 mg/dL (ref 6–20)
CHLORIDE: 102 mmol/L (ref 101–111)
CO2: 27 mmol/L (ref 22–32)
Calcium: 8.8 mg/dL — ABNORMAL LOW (ref 8.9–10.3)
Creatinine, Ser: 0.66 mg/dL (ref 0.61–1.24)
GFR calc non Af Amer: >60 mL/min (ref >60)
Glucose, Bld: 129 mg/dL — ABNORMAL HIGH (ref 65–99)
POTASSIUM: 4.1 mmol/L (ref 3.5–5.1)
SODIUM: 136 mmol/L (ref 135–145)
Total Bilirubin: 1.3 mg/dL — ABNORMAL HIGH (ref 0.3–1.2)
Total Protein: 5.1 g/dL — ABNORMAL LOW (ref 6.5–8.1)

## 2016-11-14 LAB — LACTATE DEHYDROGENASE: LDH: 152 U/L (ref 98–192)

## 2016-11-14 MED ORDER — ACETAMINOPHEN 325 MG PO TABS
650.0000 mg | ORAL_TABLET | Freq: Once | ORAL | Status: AC
  Administered 2016-11-14: 650 mg via ORAL
  Filled 2016-11-14: qty 2

## 2016-11-14 MED ORDER — SODIUM CHLORIDE 0.9 % IV SOLN
375.0000 mg/m2 | Freq: Once | INTRAVENOUS | Status: AC
  Administered 2016-11-14: 700 mg via INTRAVENOUS
  Filled 2016-11-14: qty 50

## 2016-11-14 MED ORDER — DIPHENHYDRAMINE HCL 25 MG PO CAPS
25.0000 mg | ORAL_CAPSULE | Freq: Once | ORAL | Status: AC
  Administered 2016-11-14: 25 mg via ORAL
  Filled 2016-11-14: qty 1

## 2016-11-14 MED ORDER — SODIUM CHLORIDE 0.9% FLUSH
10.0000 mL | INTRAVENOUS | Status: DC | PRN
  Filled 2016-11-14: qty 10

## 2016-11-14 MED ORDER — SODIUM CHLORIDE 0.9 % IV SOLN
Freq: Once | INTRAVENOUS | Status: AC
  Administered 2016-11-14: 10:00:00 via INTRAVENOUS
  Filled 2016-11-14: qty 1000

## 2016-11-14 MED ORDER — HEPARIN SOD (PORK) LOCK FLUSH 100 UNIT/ML IV SOLN: 500.0000 [IU] | Freq: Once | INTRAVENOUS | Status: DC | PRN

## 2016-11-14 MED ORDER — SODIUM CHLORIDE 0.9% FLUSH
10.0000 mL | INTRAVENOUS | Status: DC | PRN
  Administered 2016-11-14: 10 mL via INTRAVENOUS
  Filled 2016-11-14: qty 10

## 2016-11-14 MED ORDER — HEPARIN SOD (PORK) LOCK FLUSH 100 UNIT/ML IV SOLN
500.0000 [IU] | Freq: Once | INTRAVENOUS | Status: AC
  Administered 2016-11-14: 500 [IU] via INTRAVENOUS
  Filled 2016-11-14: qty 5

## 2016-11-14 NOTE — Assessment & Plan Note (Addendum)
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 [? Transformed lymphoma]. Status post 3 cycles of Rituxan and prednisone-PET scan significant improved response.  # Proceed with cycle #5  of Rituxan-prednisone today; labs are adequate.   # Caecum uptake-question polyp versus primary colon malignancy. Discussed with Dr.Sankar; discussed with son. Unfortunately, patient did not follow-up.   # Oral thrush- recommend diflucan/ nystatin.  # A. Fib-aroxysmal/rate controlled- on aspirin only.  # follow up with me in 3weeks /labs/rituximab.

## 2016-11-14 NOTE — Progress Notes (Signed)
[name] NOTE  Patient Care Team: Sharyne Peach, MD as PCP - General (Family Medicine) Shela Nevin, MD as Attending Physician (Cardiology) Cammie Sickle, MD as Consulting Physician (Internal Medicine) Christene Lye, MD as Surgeon (General Surgery)  CHIEF COMPLAINTS/PURPOSE OF CONSULTATION:   Oncology History   # MARCH- April 2018[facility] STAGE IV- [EBV positive; R. Ax LN; Dr.Sankar]; ? ABC subtype;POSITIVE FOR MYC GENE-RE-ARRANAGMENT.  FEB 20th 2018- PET Borderline enlarged hypermetabolic lymph nodes ] throughout the neck, chest, abdomen and pelvis, together with hypermetabolic Splenomegaly.   # April 2018- R-Pred [hx of ICMP; April 2018- EF65%].    # 2014- THROMBOCYTOPENIA [130s-140s] sec to splenomegaly; Feb- 2017- 107/N-Hb [hep B/C/HIV- Neg]; US-moderate splenomegaly. March 2017- BMBx- Megakaryocytosis/ otherwise no obvious dyspoiesis/ or malignancy; no increase in reticulin fibrosis; cytogenetics-n.    # 2017- ? 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   Gary Ferrell 81 y.o.  male Caucasian patient With newly diagnosed mild to moderate dementia and with above history of diffuse B-cell lymphoma [? Transformed] currently status post 4 cycles of rituximab-prednisone is here for follow-up.  Unfortunately patient did not follow up with Dr. Jamal Collin as recommended. As per the family the nursing home should have taken the patient appointment.  Patient complains of pain in the right upper jaw. Complains of mild difficulty  swallowing.  Patient continues to be nursing home/rehabilitation. No fever or chills. Gaining weight. No skin lesions. Otherwise no nausea no vomiting. Appetite improving.  ROS: Difficult to assess given patient's dementia. MEDICAL HISTORY:  Past Medical History:  Diagnosis Date  . AICD (automatic cardioverter/defibrillator) present   . BPH (benign prostatic hyperplasia)    a. 06/2012 s/p Greenlight Laser Prostatectomy.  . Carotid disease, bilateral ([place])    a. 1990's s/p L CEA;  b. 11/2004 s/p redo L CEA;  c. 06/2013 Carotid U/S: RICA 58-85%, LICA patent.  . Chronic systolic CHF (congestive heart failure) ([place])    a. 04/2013 Echo: EF 35%, inf DK, inflat AK, diast dysfxn, RV HK, mild MR, nmildly dil LA.  Marland Kitchen CINV (chemotherapy-induced nausea and vomiting)   . CKD (chronic kidney disease), stage II   . Coronary artery disease    a. 2007 s/p MI & CABG.  . Dementia   . Diverticulosis    a. 10/2013 colonoscopy (otw nl study).  Marland Kitchen Dyspnea   . GERD (gastroesophageal reflux disease)   . Hyperlipidemia   . Hypertension   . Ischemic cardiomyopathy    a. 04/2013 Echo: EF 35%;  b. 06/2013 s/p SJM Bi-V ICD, ser # [id number].  . Large B-cell lymphoma ([place]) 06/16/2016  . LBBB (left bundle branch block)   . Lung nodules   . Myocardial infarction ([place])   . OSA (obstructive sleep apnea)    a. 12/2013 Sleep study: severe OSA - CPAP-   . PAD (peripheral artery disease) ([place])    a. 1990's s/p Aortobifem bypass.  . Presence of permanent cardiac pacemaker   . Spinal stenosis   . Stricture of urethral meatus    a. 08/2009  s/p dil.  . Stroke [facility])    a. 2007 Periop CABG - no residual.    SURGICAL HISTORY: Past Surgical History:  Procedure Laterality Date  . AORTA - BILATERAL FEMORAL ARTERY BYPASS GRAFT  1992  . APPENDECTOMY  1964  . AXILLARY LYMPH NODE BIOPSY Left 06/16/2016   LARGE B-CELL LYMPHOMA/ AXILLARY LYMPH NODE BIOPSY;  Surgeon: Christene Lye, MD;  Location: [facility] ORS;  Service: General;   Laterality: Left;  . BI-VENTRICULAR IMPLANTABLE CARDIOVERTER DEFIBRILLATOR N/A 06/25/2013   Procedure: BI-VENTRICULAR IMPLANTABLE CARDIOVERTER DEFIBRILLATOR  (CRT-D);  Surgeon: Evans Lance, MD;  Location: [facility] CATH LAB;  Service: Cardiovascular;  Laterality: N/A;  . BI-VENTRICULAR IMPLANTABLE CARDIOVERTER DEFIBRILLATOR  (CRT-D)  06/25/2013  . BI-VENTRICULAR IMPLANTABLE CARDIOVERTER DEFIBRILLATOR  (CRT-D)  06/25/13   STJ CRTD implanted by Dr Lovena Le  . CARDIAC CATHETERIZATION  2007; 2012   "unsuccessful attempt to put stents in in 2007, had MI, had to have CABG;"  . CAROTID ENDARTERECTOMY Left 1992; 2006  . CHOLECYSTECTOMY  ~ 1996  . COLON SURGERY  2007   herniated intestinal obstruction  . CORONARY ARTERY BYPASS GRAFT  2007   "CABG X3" (06/25/2013)  . GREEN LIGHT LASER TURP (TRANSURETHRAL RESECTION OF PROSTATE N/A 07/02/2012   Procedure: GREEN LIGHT LASER TURP (TRANSURETHRAL RESECTION OF PROSTATE;  Surgeon: Ailene Rud, MD;  Location: [facility] ORS;  Service: Urology;  Laterality: N/A;  . PORTACATH PLACEMENT N/A 07/20/2016   Procedure: INSERTION PORT-A-CATH;  Surgeon: Christene Lye, MD;  Location: [facility] ORS;  Service: General;  Laterality: N/A;  . SKIN GRAFT  2008   "area of colon surgery wouldn't heal; had this graft over area"  . TONSILLECTOMY    . URETHRAL MEATOPLASTY  08/2009  . URETHRAL STRICTURE DILATATION  02/2010; 09/2010  . VENTRAL HERNIA REPAIR  2008   ventral epigastric hernia repair [Other]    SOCIAL HISTORY:  Patient currently lives at [facility] in [place]. Quit smoking 1992;  No alcohol. .  Retired Medical illustrator.  Social History   Social History  . Marital status: Married    Spouse name: N/A  . Number of children: 1  . Years of education: N/A   Occupational History  . Retired    Social History Main Topics  . Smoking status: Former Smoker    Packs/day: 1.00    Years: 20.00    Types: Cigarettes    Quit date: 04/11/1989  . Smokeless  tobacco: Never Used  . Alcohol use No  . Drug use: No  . Sexual activity: No   Other Topics Concern  . Not on file   Social History Narrative  . No narrative on file    FAMILY HISTORY: Family History  Problem Relation Age of Onset  . Pancreatic cancer Father   . Diabetes Brother   . Heart disease Brother   . Colon cancer Maternal Grandmother   . Parkinson's disease Mother   . Lung cancer Brother        stage 4    ALLERGIES:  is allergic to dapsone and gluten meal.  MEDICATIONS:  Current Outpatient Prescriptions  Medication Sig Dispense Refill  . aspirin EC 81 MG tablet Take 81 mg by mouth daily.    . carvedilol (COREG) 3.125 MG tablet TAKE 1 TABLET BY MOUTH 2 (TWO) TIMES DAILY WITH MEALS. (Patient taking differently: TAKE 1/2 TABLET BY MOUTH 2 (TWO) TIMES DAILY WITH MEALS.) 180 tablet 1  . cholecalciferol (VITAMIN D) 1000 UNITS tablet Take 1,000 Units by  mouth daily.    . finasteride (PROSCAR) 5 MG tablet Take 5 mg by mouth daily.    . hydrocortisone 2.5 % cream Apply 1 application topically 2 (two) times daily as needed (itching).    Marland Kitchen lidocaine-prilocaine (EMLA) cream Apply 1 application topically as needed. Apply to port cath site 1 hour prior to access. 30 g 3  . Multiple Vitamin (MULTIVITAMIN WITH MINERALS) TABS Take 1 tablet by mouth daily.    . Mupirocin 2 % KIT Apply 1 application topically as needed.    . ondansetron (ZOFRAN) 8 MG tablet Take one pill every 8 hours as needed for nasuea/ vomitting. 40 tablet 1  . predniSONE (DELTASONE) 50 MG tablet Pt to take 2 tablets daily for 5 days starting on the day of chemotherapy regimen.   This rx will need to be with each treatment. Take in AM with food. 10 tablet 3  . prochlorperazine (COMPAZINE) 10 MG tablet Take 1 tablet (10 mg total) by mouth every 6 (six) hours as needed for nausea or vomiting. 40 tablet 1  . rosuvastatin (CRESTOR) 20 MG tablet Take 20 mg by mouth every morning. Reported on 06/16/2015    . tamsulosin  (FLOMAX) 0.4 MG CAPS capsule Take 1 capsule by mouth daily.    . vitamin C (ASCORBIC ACID) 250 MG tablet Take 250 mg by mouth daily.     No current facility-administered medications for this visit.       Marland Kitchen  PHYSICAL EXAMINATION: ECOG PERFORMANCE STATUS: 0 - Asymptomatic  Vitals:   11/14/16 0901  BP: (!) 145/68  Pulse: 64  Temp: (!) 97.4 F (36.3 C)   Filed Weights   11/14/16 0901  Weight: 156 lb (70.8 kg)     GENERAL: Well-nourished well-developed; Alert, no distress and comfortable.  Accompanied by his son/daughter-in-law.Patient is walking himself. EYES: no pallor or icterus OROPHARYNX: no ulceration; good dentition. Positive for thrush. NECK: supple, no masses felt LYMPH:  no palpable lymphadenopathy in the cervical,or inguinal regions; LN excision noted  LUNGS: clear to auscultation and  No wheeze or crackles HEART/CVS: regular rate & rhythm and no murmurs; No lower extremity edema ABDOMEN: abdomen soft, non-tender and normal bowel sounds; positive for splenomegaly.  Musculoskeletal:no cyanosis of digits and no clubbing  PSYCH: alert & oriented x 3; Flat affect. NEURO: no focal motor/sensory deficits SKIN: Skin rash completely resolved.      LABORATORY DATA:  I have reviewed the data as listed Lab Results  Component Value Date   WBC 5.0 11/14/2016   HGB 11.9 (L) 11/14/2016   HCT 34.4 (L) 11/14/2016   MCV 82.8 11/14/2016   PLT 162 11/14/2016    Recent Labs  10/03/16 0910 10/24/16 0842 11/14/16 0828  NA 138 138 136  K 3.9 4.1 4.1  CL 104 105 102  CO2 '28 30 27  ' GLUCOSE 180* 129* 129*  BUN '14 12 15  ' CREATININE 0.61 0.62 0.66  CALCIUM 8.9 9.1 8.8*  GFRNONAA >60 >60 >60  GFRAA >60 >60 >60  PROT 4.9* 5.3* 5.1*  ALBUMIN 3.2* 3.6 3.5  AST '22 21 18  ' ALT 19 17 15*  ALKPHOS 106 115 94  BILITOT 1.4* 1.6* 1.3*     IMPRESSION: 1. Marked improvement and in most regions complete resolution of the prior adenopathy in the neck, chest, abdomen, and pelvis.  Most of these regions are Deauville 1. A subcarinal lymph node is Deauville 3 but is now normal in size. 2. Hypermetabolic activity in the cecum is  present. Whereas on the prior exam this may have resembled physiologic activity, on today' s follow up PET this exact region appears to persist, and there is some increasing conspicuity of wall thickening along the cecum raising concern for colon cancer. Colonoscopy with special attention to the cecum is recommended. 3. Scattered lung nodules and regions of what appear to be rounded atelectasis in both lungs, mostly new compared to the prior exam, some of the nodularity might be attributable to atypical infectious process. I am skeptical that this is due to malignancy given the very minimal metabolic activity, but surveillance is likely warranted. 4. Mild splenomegaly but the hypermetabolic activity diffusely in the spleen on the prior exam has resolved. 5. Aortic Atherosclerosis (ICD10-I70.0). Coronary atherosclerosis with mild cardiomegaly. 6. Pleural calcifications on the right, with small right pleural effusion. 7.  Prominent stool throughout the colon favors constipation. 8. New superior endplate compression fracture at L4, likely benign.   Electronically Signed   By: Van Clines M.D.   On: 10/20/2016 10:09   RADIOGRAPHIC STUDIES: I have personally reviewed the radiological images as listed and agreed with the findings in the report. Nm Pet Image Restag (ps) Skull Base To Thigh  Result Date: 10/20/2016 CLINICAL DATA:  Subsequent treatment strategy for large-cell lymphoma. EXAM: NUCLEAR MEDICINE PET SKULL BASE TO THIGH TECHNIQUE: 12.5 mCi F-18 FDG was injected intravenously. Full-ring PET imaging was performed from the skull base to thigh after the radiotracer. CT data was obtained and used for attenuation correction and anatomic localization. FASTING BLOOD GLUCOSE:  Value: 127 mg/dl COMPARISON:  Multiple exams, including  05/31/2016 FINDINGS: NECK The prior adenopathy and hypermetabolic activity in the neck has resolved (Deauville 1). Low-grade activity along the left posterior nasopharynx was not present previously and currently has maximum SUV 5.2, this could be inflammatory. CHEST Previously hypermetabolic and pathologically enlarged mediastinal, hilar, left infrahilar, and axillary adenopathy is no longer hypermetabolic and for the most part has resolved. The subcarinal node previously measured 1.6 cm in short axis and currently measures 0.9 cm in short axis on image 92 of series 3, maximum SUV in this node is 3.0 (formerly 16.6). Background mediastinal blood pool activity 2.1. A 1.8 by 1.0 cm new right upper lobe pulmonary nodule has maximum SUV 2.0. Small right pleural effusion. Nodular 3.3 by 1.5 cm density peripherally in the left lower lobe has associated volume loss and is suspicious for rounded atelectasis, and is not hypermetabolic. New lingular density potentially also representing round atelectasis measuring 2.0 by 1.5 cm on image 108/3 is observed, maximum SUV 3.1. A right infrahilar nodule measuring 2.6 by 1.8 cm on image 96/3 has maximum SUV 2.4. A new 0.9 by 0.8 cm right middle lobe nodule on image 109/3 is not hypermetabolic. Several other smaller scattered nodular opacities are present in both lungs. There is also old granulomatous disease on the left. Coronary, aortic arch, and branch vessel atherosclerotic vascular disease. Prior CABG. Mild cardiomegaly. Calcification along the right pleural effusion on image 120/3. ABDOMEN/PELVIS Focal hypermetabolic activity in the cecum associated with wall thickening, maximum SUV 11.5, formerly 10.6. Cholecystectomy. Prominent stool throughout the colon favors constipation. No hypermetabolic activity in the spleen, which measures 10.1 by 6.4 by 14.5 cm (volume = 490 cm^3). Aortoiliac atherosclerotic vascular disease.  Aortobifemoral graft. Prior porta hepatis,  retroperitoneal, and pelvic hypermetabolic adenopathy has resolved (Deauville 1). SKELETON No focal hypermetabolic activity to suggest skeletal metastasis. New superior endplate compression fracture at L4. IMPRESSION: 1. Marked improvement and in most regions  complete resolution of the prior adenopathy in the neck, chest, abdomen, and pelvis. Most of these regions are Deauville 1. A subcarinal lymph node is Deauville 3 but is now normal in size. 2. Hypermetabolic activity in the cecum is present. Whereas on the prior exam this may have resembled physiologic activity, on today' s follow up PET this exact region appears to persist, and there is some increasing conspicuity of wall thickening along the cecum raising concern for colon cancer. Colonoscopy with special attention to the cecum is recommended. 3. Scattered lung nodules and regions of what appear to be rounded atelectasis in both lungs, mostly new compared to the prior exam, some of the nodularity might be attributable to atypical infectious process. I am skeptical that this is due to malignancy given the very minimal metabolic activity, but surveillance is likely warranted. 4. Mild splenomegaly but the hypermetabolic activity diffusely in the spleen on the prior exam has resolved. 5. Aortic Atherosclerosis (ICD10-I70.0). Coronary atherosclerosis with mild cardiomegaly. 6. Pleural calcifications on the right, with small right pleural effusion. 7.  Prominent stool throughout the colon favors constipation. 8. New superior endplate compression fracture at L4, likely benign. Electronically Signed   By: Van Clines M.D.   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 [? Transformed lymphoma]. Status post 3 cycles of Rituxan and prednisone-PET scan significant improved  response.  # Proceed with cycle #5  of Rituxan-prednisone today; labs are adequate.   # Caecum uptake-question polyp versus primary colon malignancy. Discussed with Dr.Sankar; discussed with son. Unfortunately, patient did not follow-up.   # Oral thrush- recommend diflucan/ nystatin.  # A. Fib-aroxysmal/rate controlled- on aspirin only.  # follow up with me in 3weeks /labs/rituximab.      Cammie Sickle, MD 11/14/2016 6:04 PM

## 2016-11-16 ENCOUNTER — Encounter: Payer: Self-pay | Admitting: General Surgery

## 2016-11-16 ENCOUNTER — Ambulatory Visit (INDEPENDENT_AMBULATORY_CARE_PROVIDER_SITE_OTHER): Payer: Medicare Other | Admitting: General Surgery

## 2016-11-16 VITALS — BP 118/60 | HR 64 | Resp 14 | Ht 71.0 in | Wt 160.0 lb

## 2016-11-16 DIAGNOSIS — Z86010 Personal history of colonic polyps: Secondary | ICD-10-CM | POA: Diagnosis not present

## 2016-11-16 DIAGNOSIS — C8334 Diffuse large B-cell lymphoma, lymph nodes of axilla and upper limb: Secondary | ICD-10-CM | POA: Diagnosis not present

## 2016-11-16 DIAGNOSIS — R933 Abnormal findings on diagnostic imaging of other parts of digestive tract: Secondary | ICD-10-CM | POA: Diagnosis not present

## 2016-11-16 MED ORDER — POLYETHYLENE GLYCOL 3350 17 GM/SCOOP PO POWD: ORAL | 0 refills | Status: DC

## 2016-11-16 NOTE — Patient Instructions (Signed)
Colonoscopy, Adult A colonoscopy is an exam to look at the entire large intestine. During the exam, a lubricated, bendable tube is inserted into the anus and then passed into the rectum, colon, and other parts of the large intestine. A colonoscopy is often done as a part of normal colorectal screening or in response to certain symptoms, such as anemia, persistent diarrhea, abdominal pain, and blood in the stool. The exam can help screen for and diagnose medical problems, including:  Tumors.  Polyps.  Inflammation.  Areas of bleeding.  Tell a health care provider about:  Any allergies you have.  All medicines you are taking, including vitamins, herbs, eye drops, creams, and over-the-counter medicines.  Any problems you or family members have had with anesthetic medicines.  Any blood disorders you have.  Any surgeries you have had.  Any medical conditions you have.  Any problems you have had passing stool. What are the risks? Generally, this is a safe procedure. However, problems may occur, including:  Bleeding.  A tear in the intestine.  A reaction to medicines given during the exam.  Infection (rare).  What happens before the procedure? Eating and drinking restrictions Follow instructions from your health care provider about eating and drinking, which may include:  A few days before the procedure - follow a low-fiber diet. Avoid nuts, seeds, dried fruit, raw fruits, and vegetables.  1-3 days before the procedure - follow a clear liquid diet. Drink only clear liquids, such as clear broth or bouillon, black coffee or tea, clear juice, clear soft drinks or sports drinks, gelatin dessert, and popsicles. Avoid any liquids that contain red or purple dye.  On the day of the procedure - do not eat or drink anything during the 2 hours before the procedure, or within the time period that your health care provider recommends.  Bowel prep If you were prescribed an oral bowel prep  to clean out your colon:  Take it as told by your health care provider. Starting the day before your procedure, you will need to drink a large amount of medicated liquid. The liquid will cause you to have multiple loose stools until your stool is almost clear or light green.  If your skin or anus gets irritated from diarrhea, you may use these to relieve the irritation: ? Medicated wipes, such as adult wet wipes with aloe and vitamin E. ? A skin soothing-product like petroleum jelly.  If you vomit while drinking the bowel prep, take a break for up to 60 minutes and then begin the bowel prep again. If vomiting continues and you cannot take the bowel prep without vomiting, call your health care provider.  General instructions  Ask your health care provider about changing or stopping your regular medicines. This is especially important if you are taking diabetes medicines or blood thinners.  Plan to have someone take you home from the hospital or clinic. What happens during the procedure?  An IV tube may be inserted into one of your veins.  You will be given medicine to help you relax (sedative).  To reduce your risk of infection: ? Your health care team will wash or sanitize their hands. ? Your anal area will be washed with soap.  You will be asked to lie on your side with your knees bent.  Your health care provider will lubricate a long, thin, flexible tube. The tube will have a camera and a light on the end.  The tube will be inserted into your   anus.  The tube will be gently eased through your rectum and colon.  Air will be delivered into your colon to keep it open. You may feel some pressure or cramping.  The camera will be used to take images during the procedure.  A small tissue sample may be removed from your body to be examined under a microscope (biopsy). If any potential problems are found, the tissue will be sent to a lab for testing.  If small polyps are found, your  health care provider may remove them and have them checked for cancer cells.  The tube that was inserted into your anus will be slowly removed. The procedure may vary among health care providers and hospitals. What happens after the procedure?  Your blood pressure, heart rate, breathing rate, and blood oxygen level will be monitored until the medicines you were given have worn off.  Do not drive for 24 hours after the exam.  You may have a small amount of blood in your stool.  You may pass gas and have mild abdominal cramping or bloating due to the air that was used to inflate your colon during the exam.  It is up to you to get the results of your procedure. Ask your health care provider, or the department performing the procedure, when your results will be ready. This information is not intended to replace advice given to you by your health care provider. Make sure you discuss any questions you have with your health care provider. Document Released: 03/25/2000 Document Revised: 01/27/2016 Document Reviewed: 06/09/2015 Elsevier Interactive Patient Education  2018 [employer]  

## 2016-11-16 NOTE — Progress Notes (Signed)
Patient ID: Gary Ferrell, male   DOB: 09-Nov-1935, 81 y.o.   MRN: [id number]  Chief Complaint  Patient presents with  . Colonoscopy    HPI Gary Ferrell is a 81 y.o. male here today for a colonoscopy discussion. Last colonoscopy was in 2015. Patient states he has not GI problems at this time. He had a PET scan done on 10/20/2016 . Has responded very well to chemo for his large diffuse B-cell lymphoma. Port-a-cath in place. Last chemo infusion was 11/14/16. HPI  Past Medical History:  Diagnosis Date  . AICD (automatic cardioverter/defibrillator) present   . BPH (benign prostatic hyperplasia)    a. 06/2012 s/p Greenlight Laser Prostatectomy.  . Carotid disease, bilateral ([place])    a. 1990's s/p L CEA;  b. 11/2004 s/p redo L CEA;  c. 06/2013 Carotid U/S: RICA 50-53%, LICA patent.  . Chronic systolic CHF (congestive heart failure) ([place])    a. 04/2013 Echo: EF 35%, inf DK, inflat AK, diast dysfxn, RV HK, mild MR, nmildly dil LA.  Marland Kitchen CINV (chemotherapy-induced nausea and vomiting)   . CKD (chronic kidney disease), stage II   . Coronary artery disease    a. 2007 s/p MI & CABG.  . Dementia   . Diverticulosis    a. 10/2013 colonoscopy (otw nl study).  Marland Kitchen Dyspnea   . GERD (gastroesophageal reflux disease)   . Hyperlipidemia   . Hypertension   . Ischemic cardiomyopathy    a. 04/2013 Echo: EF 35%;  b. 06/2013 s/p SJM Bi-V ICD, ser # [id number].  . Large B-cell lymphoma ([place]) 06/16/2016  . LBBB (left bundle branch block)   . Lung nodules   . Myocardial infarction ([place])   . OSA (obstructive sleep apnea)    a. 12/2013 Sleep study: severe OSA - CPAP-   . PAD (peripheral artery disease) ([place])    a. 1990's s/p Aortobifem bypass.  . Presence of permanent cardiac pacemaker   . Spinal stenosis   . Stricture of urethral meatus    a. 08/2009 s/p dil.  . Stroke [facility])    a. 2007 Periop CABG - no residual.    Past Surgical History:  Procedure Laterality Date  . AORTA - BILATERAL FEMORAL ARTERY BYPASS GRAFT   1992  . APPENDECTOMY  1964  . AXILLARY LYMPH NODE BIOPSY Left 06/16/2016   LARGE B-CELL LYMPHOMA/ AXILLARY LYMPH NODE BIOPSY;  Surgeon: Christene Lye, MD;  Location: [facility] ORS;  Service: General;  Laterality: Left;  . BI-VENTRICULAR IMPLANTABLE CARDIOVERTER DEFIBRILLATOR N/A 06/25/2013   Procedure: BI-VENTRICULAR IMPLANTABLE CARDIOVERTER DEFIBRILLATOR  (CRT-D);  Surgeon: Evans Lance, MD;  Location: [facility] C[facility] LAB;  Service: Cardiovascular;  Laterality: N/A;  . BI-VENTRICULAR IMPLANTABLE CARDIOVERTER DEFIBRILLATOR  (CRT-D)  06/25/2013  . BI-VENTRICULAR IMPLANTABLE CARDIOVERTER DEFIBRILLATOR  (CRT-D)  06/25/13   STJ CRTD implanted by Dr Lovena Le  . CARDIAC CATHETERIZATION  2007; 2012   "unsuccessful attempt to put stents in in 2007, had MI, had to have CABG;"  . CAROTID ENDARTERECTOMY Left 1992; 2006  . CHOLECYSTECTOMY  ~ 1996  . COLON SURGERY  2007   herniated intestinal obstruction  . CORONARY ARTERY BYPASS GRAFT  2007   "CABG X3" (06/25/2013)  . GREEN LIGHT LASER TURP (TRANSURETHRAL RESECTION OF PROSTATE N/A 07/02/2012   Procedure: GREEN LIGHT LASER TURP (TRANSURETHRAL RESECTION OF PROSTATE;  Surgeon: Ailene Rud, MD;  Location: [facility] ORS;  Service: Urology;  Laterality: N/A;  . PORTACATH PLACEMENT N/A 07/20/2016   Procedure: INSERTION PORT-A-CATH;  Surgeon: Daryel Gerald  Robinette Haines, MD;  Location: [facility] ORS;  Service: General;  Laterality: N/A;  . SKIN GRAFT  2008   "area of colon surgery wouldn't heal; had this graft over area"  . TONSILLECTOMY    . URETHRAL MEATOPLASTY  08/2009  . URETHRAL STRICTURE DILATATION  02/2010; 09/2010  . VENTRAL HERNIA REPAIR  2008   ventral epigastric hernia repair [Other]    Family History  Problem Relation Age of Onset  . Pancreatic cancer Father   . Diabetes Brother   . Heart disease Brother   . Colon cancer Maternal Grandmother   . Parkinson's disease Mother   . Lung cancer Brother        stage 4    Social History Social History  Substance  Use Topics  . Smoking status: Former Smoker    Packs/day: 1.00    Years: 20.00    Types: Cigarettes    Quit date: 04/11/1989  . Smokeless tobacco: Never Used  . Alcohol use No    Allergies  Allergen Reactions  . Dapsone Other (See Comments)    REACTION: caused neuropathy/ affected walking  patient stated he received high dose of dapsone which caused the neuropathy     Current Outpatient Prescriptions  Medication Sig Dispense Refill  . aspirin EC 81 MG tablet Take 81 mg by mouth daily.    . carvedilol (COREG) 3.125 MG tablet TAKE 1 TABLET BY MOUTH 2 (TWO) TIMES DAILY WITH MEALS. (Patient taking differently: TAKE 1/2 TABLET BY MOUTH 2 (TWO) TIMES DAILY WITH MEALS.) 180 tablet 1  . cholecalciferol (VITAMIN D) 1000 UNITS tablet Take 1,000 Units by mouth daily.    . finasteride (PROSCAR) 5 MG tablet Take 5 mg by mouth daily.    . hydrocortisone 2.5 % cream Apply 1 application topically 2 (two) times daily as needed (itching).    Marland Kitchen lidocaine-prilocaine (EMLA) cream Apply 1 application topically as needed. Apply to port cath site 1 hour prior to access. 30 g 3  . Multiple Vitamin (MULTIVITAMIN WITH MINERALS) TABS Take 1 tablet by mouth daily.    . Mupirocin 2 % KIT Apply 1 application topically as needed.    . ondansetron (ZOFRAN) 8 MG tablet Take one pill every 8 hours as needed for nasuea/ vomitting. 40 tablet 1  . predniSONE (DELTASONE) 50 MG tablet Pt to take 2 tablets daily for 5 days starting on the day of chemotherapy regimen.   This rx will need to be with each treatment. Take in AM with food. 10 tablet 3  . prochlorperazine (COMPAZINE) 10 MG tablet Take 1 tablet (10 mg total) by mouth every 6 (six) hours as needed for nausea or vomiting. 40 tablet 1  . rosuvastatin (CRESTOR) 20 MG tablet Take 20 mg by mouth every morning. Reported on 06/16/2015    . tamsulosin (FLOMAX) 0.4 MG CAPS capsule Take 1 capsule by mouth daily.    . vitamin C (ASCORBIC ACID) 250 MG tablet Take 250 mg by mouth  daily.    . polyethylene glycol powder (GLYCOLAX/MIRALAX) powder 255 grams one bottle for colonoscopy prep 255 g 0   No current facility-administered medications for this visit.     Review of Systems Review of Systems  Constitutional: Negative.   Respiratory: Negative.   Cardiovascular: Negative.   Gastrointestinal: Negative.     Blood pressure 118/60, pulse 64, resp. rate 14, height _0  (1.803 m), weight 160 lb (72.6 kg).  Physical Exam Physical Exam  Constitutional: He is oriented to person, place,  and time. He appears well-developed and well-nourished.  Eyes: Conjunctivae are normal. No scleral icterus.  Neck: Neck supple.  Cardiovascular: Normal rate, regular rhythm and normal heart sounds.   Pulmonary/Chest: Effort normal and breath sounds normal.  Abdominal: Soft. Bowel sounds are normal. There is no tenderness.  Lymphadenopathy:    He has no cervical adenopathy.    He has no axillary adenopathy.  Neurological: He is alert and oriented to person, place, and time.  Skin: Skin is warm and dry.  Psychiatric: He has a normal mood and affect.    Data Reviewed Prior colonoscopy report, PET imaging reviewed   Assessment    Abnormal imaging of cecum - PET scan done on 10/20/16 showed hypermetabolic activity and wall thickening in the cecum concerning for colon cancer.  This was discussed with pt and his son- both agreeable to colonoscopy to further evaluate.     Plan   Schedule colonoscopy   Colonoscopy with possible biopsy/polypectomy prn: Information regarding the procedure, including its potential risks and complications (including but not limited to perforation of the bowel, which may require emergency surgery to repair, and bleeding) was verbally given to the patient. Educational information regarding lower intestinal endoscopy was given to the patient. Written instructions for how to complete the bowel prep using Miralax were provided. The importance of drinking  ample fluids to avoid dehydration as a result of the prep emphasized.       HPI, Physical Exam, Assessment and Plan have been scribed under the direction and in the presence of Mckinley Jewel, MD  Gaspar Cola, CMA  I have completed the exam and reviewed the above documentation for accuracy and completeness.  I agree with the above.  Haematologist has been used and any errors in dictation or transcription are unintentional.  Riggins Cisek G. Jamal Collin, M.D., F.A.C.S.  Junie Panning G 11/16/2016, 12:48 PM  Patient has been scheduled for a colonoscopy on 12-20-16 at [facility] Miralax prescription has been sent in to the patient's pharmacy today. He will only take Coreg the morning of procedure with a sip of water. Colonoscopy instructions have been reviewed with the patient. This patient is aware to call the office if they have further questions.   Dominga Ferry, CMA

## 2016-11-16 NOTE — Progress Notes (Signed)
  This encounter was created in error - please disregard.  This encounter was created in error - please disregard.  This encounter was created in error - please disregard.  This encounter was created in error - please disregard. 

## 2016-11-21 ENCOUNTER — Telehealth: Payer: Self-pay | Admitting: *Deleted

## 2016-11-21 NOTE — Telephone Encounter (Addendum)
Call giver called to report that patient has been on Nystatin susp for 7 days and has had no improvement. Asking what else can be done for him. He has thrush, difficulty swallowing due to the pain, Wants an appt to be seen and evaluated. Please advise

## 2016-11-21 NOTE — Telephone Encounter (Signed)
I could see pt tomorrow in [place] at 2:00 pm. Please inform pt/family.

## 2016-11-21 NOTE — Telephone Encounter (Signed)
I spoke with Care facility and they will try to get patient to appt tomorrow in [facility]

## 2016-11-22 ENCOUNTER — Inpatient Hospital Stay (HOSPITAL_BASED_OUTPATIENT_CLINIC_OR_DEPARTMENT_OTHER): Payer: Medicare Other | Admitting: Internal Medicine

## 2016-11-22 VITALS — BP 144/45 | HR 71 | Temp 97.8°F | Resp 20 | Ht 71.0 in | Wt 164.0 lb

## 2016-11-22 DIAGNOSIS — R1310 Dysphagia, unspecified: Secondary | ICD-10-CM

## 2016-11-22 DIAGNOSIS — Z7952 Long term (current) use of systemic steroids: Secondary | ICD-10-CM

## 2016-11-22 DIAGNOSIS — R918 Other nonspecific abnormal finding of lung field: Secondary | ICD-10-CM

## 2016-11-22 DIAGNOSIS — G4733 Obstructive sleep apnea (adult) (pediatric): Secondary | ICD-10-CM

## 2016-11-22 DIAGNOSIS — N182 Chronic kidney disease, stage 2 (mild): Secondary | ICD-10-CM

## 2016-11-22 DIAGNOSIS — Z87891 Personal history of nicotine dependence: Secondary | ICD-10-CM

## 2016-11-22 DIAGNOSIS — I5022 Chronic systolic (congestive) heart failure: Secondary | ICD-10-CM | POA: Diagnosis not present

## 2016-11-22 DIAGNOSIS — E785 Hyperlipidemia, unspecified: Secondary | ICD-10-CM

## 2016-11-22 DIAGNOSIS — I4891 Unspecified atrial fibrillation: Secondary | ICD-10-CM | POA: Diagnosis not present

## 2016-11-22 DIAGNOSIS — I252 Old myocardial infarction: Secondary | ICD-10-CM

## 2016-11-22 DIAGNOSIS — Z801 Family history of malignant neoplasm of trachea, bronchus and lung: Secondary | ICD-10-CM

## 2016-11-22 DIAGNOSIS — R161 Splenomegaly, not elsewhere classified: Secondary | ICD-10-CM

## 2016-11-22 DIAGNOSIS — Z950 Presence of cardiac pacemaker: Secondary | ICD-10-CM

## 2016-11-22 DIAGNOSIS — I2510 Atherosclerotic heart disease of native coronary artery without angina pectoris: Secondary | ICD-10-CM | POA: Diagnosis not present

## 2016-11-22 DIAGNOSIS — I130 Hypertensive heart and chronic kidney disease with heart failure and stage 1 through stage 4 chronic kidney disease, or unspecified chronic kidney disease: Secondary | ICD-10-CM | POA: Diagnosis not present

## 2016-11-22 DIAGNOSIS — C8584 Other specified types of non-Hodgkin lymphoma, lymph nodes of axilla and upper limb: Secondary | ICD-10-CM

## 2016-11-22 DIAGNOSIS — B370 Candidal stomatitis: Secondary | ICD-10-CM

## 2016-11-22 DIAGNOSIS — N400 Enlarged prostate without lower urinary tract symptoms: Secondary | ICD-10-CM

## 2016-11-22 DIAGNOSIS — Z79899 Other long term (current) drug therapy: Secondary | ICD-10-CM

## 2016-11-22 DIAGNOSIS — I255 Ischemic cardiomyopathy: Secondary | ICD-10-CM

## 2016-11-22 DIAGNOSIS — Z800 Family history of malignant neoplasm of digestive organs: Secondary | ICD-10-CM

## 2016-11-22 DIAGNOSIS — F0390 Unspecified dementia without behavioral disturbance: Secondary | ICD-10-CM

## 2016-11-22 DIAGNOSIS — C8334 Diffuse large B-cell lymphoma, lymph nodes of axilla and upper limb: Secondary | ICD-10-CM | POA: Diagnosis not present

## 2016-11-22 DIAGNOSIS — Z951 Presence of aortocoronary bypass graft: Secondary | ICD-10-CM

## 2016-11-22 DIAGNOSIS — Z5112 Encounter for antineoplastic immunotherapy: Secondary | ICD-10-CM | POA: Diagnosis not present

## 2016-11-22 DIAGNOSIS — K219 Gastro-esophageal reflux disease without esophagitis: Secondary | ICD-10-CM

## 2016-11-22 DIAGNOSIS — Z8673 Personal history of transient ischemic attack (TIA), and cerebral infarction without residual deficits: Secondary | ICD-10-CM

## 2016-11-22 DIAGNOSIS — Z7982 Long term (current) use of aspirin: Secondary | ICD-10-CM

## 2016-11-22 MED ORDER — MAGIC MOUTHWASH W/LIDOCAINE: 5.0000 mL | Freq: Four times a day (QID) | ORAL | 3 refills | Status: DC

## 2016-11-22 NOTE — Assessment & Plan Note (Addendum)
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 [? Transformed lymphoma]. Status post 3 cycles of Rituxan and prednisone-PET scan significant improved response. Currently status post cycle #5  of Rituxan-prednisone today-approximately 2 weeks ago. No concerns for any progression.  # Difficulty swallowing pain/difficulty swallowing- no thrush noted. Question mucositis. Recommend magic mouthwash. Prescription given.  # Caecum uptake-question polyp versus primary colon malignancy. Awaiting repeat evaluation with Dr. Sankar.  # A. Fib-aroxysmal/rate controlled- on aspirin only.  # Patient recommended follow up as planned for cycle #6 of treatment.  

## 2016-11-22 NOTE — Progress Notes (Signed)
[place] NOTE  Patient Care Team: Sharyne Peach, MD as PCP - General (Family Medicine) Shela Nevin, MD as Attending Physician (Cardiology) Cammie Sickle, MD as Consulting Physician (Internal Medicine) Christene Lye, MD as Surgeon (General Surgery)  CHIEF COMPLAINTS/PURPOSE OF CONSULTATION:   Oncology History   # MARCH- April 2018[facility] STAGE IV- [EBV positive; R. Ax LN; Dr.Sankar]; ? ABC subtype;POSITIVE FOR MYC GENE-RE-ARRANAGMENT.  FEB 20th 2018- PET Borderline enlarged hypermetabolic lymph nodes ] throughout the neck, chest, abdomen and pelvis, together with hypermetabolic Splenomegaly.   # April 2018- R-Pred [hx of ICMP; April 2018- EF65%].    # 2014- THROMBOCYTOPENIA [130s-140s] sec to splenomegaly; Feb- 2017- 107/N-Hb [hep B/C/HIV- Neg]; US-moderate splenomegaly. March 2017- BMBx- Megakaryocytosis/ otherwise no obvious dyspoiesis/ or malignancy; no increase in reticulin fibrosis; cytogenetics-n.    # 2017- ? 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 He is accompanied by his daughter-in-law.  Gary Ferrell 81 y.o.  male Caucasian patient With newly diagnosed mild to moderate dementia and with above history of diffuse B-cell lymphoma [? Transformed] currently status post 4 cycles of rituximab-prednisone. He had his last treatment approximately 2 weeks ago.  As per family patient noted to have difficulty swallowing/pain with swallowing in the last 2-3 days. Patient was recently treated with nystatin for thrush.   Patient  continues to be nursing home/rehabilitation. No fever or chills. Gaining weight. No skin lesions. Otherwise no nausea no vomiting. Appetite improving. Otherwise denies any new lumps or bumps.  ROS: Difficult to assess given patient's dementia. MEDICAL HISTORY:  Past Medical History:  Diagnosis Date  . AICD (automatic cardioverter/defibrillator) present   . BPH (benign prostatic hyperplasia)    a. 06/2012 s/p Greenlight Laser Prostatectomy.  . Carotid disease, bilateral ([place])    a. 1990's s/p L CEA;  b. 11/2004 s/p redo L CEA;  c. 06/2013 Carotid U/S: RICA 72-53%, LICA patent.  . Chronic systolic CHF (congestive heart failure) ([place])    a. 04/2013 Echo: EF 35%, inf DK, inflat AK, diast dysfxn, RV HK, mild MR, nmildly dil LA.  Marland Kitchen CINV (chemotherapy-induced nausea and vomiting)   . CKD (chronic kidney disease), stage II   . Coronary artery disease    a. 2007 s/p MI & CABG.  . Dementia   . Diverticulosis    a. 10/2013 colonoscopy (otw nl study).  Marland Kitchen Dyspnea   . GERD (gastroesophageal reflux disease)   . Hyperlipidemia   . Hypertension   . Ischemic cardiomyopathy    a. 04/2013 Echo: EF 35%;  b. 06/2013 s/p SJM Bi-V ICD, ser # [id number].  . Large B-cell lymphoma ([place]) 06/16/2016  . LBBB (left bundle branch block)   . Lung nodules   . Myocardial infarction ([place])   . OSA (obstructive sleep apnea)    a. 12/2013 Sleep study: severe OSA - CPAP-   . PAD (peripheral artery disease) ([place])    a. 1990's s/p Aortobifem bypass.  . Presence of permanent cardiac pacemaker   . Spinal stenosis   . Stricture of urethral meatus  a. 08/2009 s/p dil.  . Stroke [facility])    a. 2007 Periop CABG - no residual.    SURGICAL HISTORY: Past Surgical History:  Procedure Laterality Date  . AORTA - BILATERAL FEMORAL ARTERY BYPASS GRAFT  1992  . APPENDECTOMY  1964  . AXILLARY LYMPH NODE BIOPSY Left 06/16/2016   LARGE B-CELL LYMPHOMA/ AXILLARY LYMPH NODE BIOPSY;  Surgeon: Christene Lye, MD;  Location: [facility] ORS;   Service: General;  Laterality: Left;  . BI-VENTRICULAR IMPLANTABLE CARDIOVERTER DEFIBRILLATOR N/A 06/25/2013   Procedure: BI-VENTRICULAR IMPLANTABLE CARDIOVERTER DEFIBRILLATOR  (CRT-D);  Surgeon: Evans Lance, MD;  Location: [facility] CATH LAB;  Service: Cardiovascular;  Laterality: N/A;  . BI-VENTRICULAR IMPLANTABLE CARDIOVERTER DEFIBRILLATOR  (CRT-D)  06/25/2013  . BI-VENTRICULAR IMPLANTABLE CARDIOVERTER DEFIBRILLATOR  (CRT-D)  06/25/13   STJ CRTD implanted by Dr Lovena Le  . CARDIAC CATHETERIZATION  2007; 2012   "unsuccessful attempt to put stents in in 2007, had MI, had to have CABG;"  . CAROTID ENDARTERECTOMY Left 1992; 2006  . CHOLECYSTECTOMY  ~ 1996  . COLON SURGERY  2007   herniated intestinal obstruction  . CORONARY ARTERY BYPASS GRAFT  2007   "CABG X3" (06/25/2013)  . GREEN LIGHT LASER TURP (TRANSURETHRAL RESECTION OF PROSTATE N/A 07/02/2012   Procedure: GREEN LIGHT LASER TURP (TRANSURETHRAL RESECTION OF PROSTATE;  Surgeon: Ailene Rud, MD;  Location: [facility] ORS;  Service: Urology;  Laterality: N/A;  . PORTACATH PLACEMENT N/A 07/20/2016   Procedure: INSERTION PORT-A-CATH;  Surgeon: Christene Lye, MD;  Location: [facility] ORS;  Service: General;  Laterality: N/A;  . SKIN GRAFT  2008   "area of colon surgery wouldn't heal; had this graft over area"  . TONSILLECTOMY    . URETHRAL MEATOPLASTY  08/2009  . URETHRAL STRICTURE DILATATION  02/2010; 09/2010  . VENTRAL HERNIA REPAIR  2008   ventral epigastric hernia repair [Other]    SOCIAL HISTORY:  Patient currently lives [facility] [place]. Quit smoking 1992;  No alcohol. .  Retired Medical illustrator.  Social History   Social History  . Marital status: Married    Spouse name: N/A  . Number of children: 1  . Years of education: N/A   Occupational History  . Retired    Social History Main Topics  . Smoking status: Former Smoker    Packs/day: 1.00    Years: 20.00    Types: Cigarettes    Quit date: 04/11/1989   . Smokeless tobacco: Never Used  . Alcohol use No  . Drug use: No  . Sexual activity: No   Other Topics Concern  . Not on file   Social History Narrative  . No narrative on file    FAMILY HISTORY: Family History  Problem Relation Age of Onset  . Pancreatic cancer Father   . Diabetes Brother   . Heart disease Brother   . Colon cancer Maternal Grandmother   . Parkinson's disease Mother   . Lung cancer Brother        stage 4    ALLERGIES:  is allergic to dapsone.  MEDICATIONS:  Current Outpatient Prescriptions  Medication Sig Dispense Refill  . aspirin EC 81 MG tablet Take 81 mg by mouth daily.    . carvedilol (COREG) 3.125 MG tablet TAKE 1 TABLET BY MOUTH 2 (TWO) TIMES DAILY WITH MEALS. (Patient taking differently: TAKE 1/2 TABLET BY MOUTH 2 (TWO) TIMES DAILY WITH MEALS.) 180 tablet 1  . cholecalciferol (VITAMIN D) 1000 UNITS tablet Take 1,000 Units by mouth  daily.    . finasteride (PROSCAR) 5 MG tablet Take 5 mg by mouth daily.    . hydrocortisone 2.5 % cream Apply 1 application topically 2 (two) times daily as needed (itching).    Marland Kitchen lidocaine-prilocaine (EMLA) cream Apply 1 application topically as needed. Apply to port cath site 1 hour prior to access. 30 g 3  . Multiple Vitamin (MULTIVITAMIN WITH MINERALS) TABS Take 1 tablet by mouth daily.    . Mupirocin 2 % KIT Apply 1 application topically as needed.    . ondansetron (ZOFRAN) 8 MG tablet Take one pill every 8 hours as needed for nasuea/ vomitting. 40 tablet 1  . prochlorperazine (COMPAZINE) 10 MG tablet Take 1 tablet (10 mg total) by mouth every 6 (six) hours as needed for nausea or vomiting. 40 tablet 1  . rosuvastatin (CRESTOR) 20 MG tablet Take 20 mg by mouth every morning. Reported on 06/16/2015    . tamsulosin (FLOMAX) 0.4 MG CAPS capsule Take 1 capsule by mouth daily.    . vitamin C (ASCORBIC ACID) 250 MG tablet Take 250 mg by mouth daily.    . magic mouthwash w/lidocaine SOLN Take 5 mLs by mouth 4 (four) times  daily. 240 mL 3  . polyethylene glycol powder (GLYCOLAX/MIRALAX) powder 255 grams one bottle for colonoscopy prep (Patient not taking: Reported on 11/22/2016) 255 g 0  . predniSONE (DELTASONE) 50 MG tablet Pt to take 2 tablets daily for 5 days starting on the day of chemotherapy regimen.   This rx will need to be with each treatment. Take in AM with food. (Patient not taking: Reported on 11/22/2016) 10 tablet 3   No current facility-administered medications for this visit.       Marland Kitchen  PHYSICAL EXAMINATION: ECOG PERFORMANCE STATUS: 0 - Asymptomatic  Vitals:   11/22/16 1437  BP: (!) 144/45  Pulse: 71  Resp: 20  Temp: 97.8 F (36.6 C)  SpO2: 100%   Filed Weights   11/22/16 1437  Weight: 164 lb (74.4 kg)     GENERAL: Well-nourished well-developed; Alert, no distress and comfortable.  Accompanied by his daughter-in-law.Patient is walking himself. EYES: no pallor or icterus OROPHARYNX: no ulceration; good dentition.No thrush noted. NECK: supple, no masses felt LYMPH:  no palpable lymphadenopathy in the cervical,or inguinal regions; LN excision noted  LUNGS: clear to auscultation and  No wheeze or crackles HEART/CVS: regular rate & rhythm and no murmurs; No lower extremity edema ABDOMEN: abdomen soft, non-tender and normal bowel sounds; positive for splenomegaly.  Musculoskeletal:no cyanosis of digits and no clubbing  PSYCH: alert & oriented x 3; Flat affect. NEURO: no focal motor/sensory deficits SKIN: Skin rash completely resolved.      LABORATORY DATA:  I have reviewed the data as listed Lab Results  Component Value Date   WBC 5.0 11/14/2016   HGB 11.9 (L) 11/14/2016   HCT 34.4 (L) 11/14/2016   MCV 82.8 11/14/2016   PLT 162 11/14/2016    Recent Labs  10/03/16 0910 10/24/16 0842 11/14/16 0828  NA 138 138 136  K 3.9 4.1 4.1  CL 104 105 102  CO2 '28 30 27  '$ GLUCOSE 180* 129* 129*  BUN '14 12 15  '$ CREATININE 0.61 0.62 0.66  CALCIUM 8.9 9.1 8.8*  GFRNONAA >60 >60 >60   GFRAA >60 >60 >60  PROT 4.9* 5.3* 5.1*  ALBUMIN 3.2* 3.6 3.5  AST '22 21 18  '$ ALT 19 17 15*  ALKPHOS 106 115 94  BILITOT 1.4* 1.6* 1.3*  IMPRESSION: 1. Marked improvement and in most regions complete resolution of the prior adenopathy in the neck, chest, abdomen, and pelvis. Most of these regions are [place] 1. A subcarinal lymph node is [place] 3 but is now normal in size. 2. Hypermetabolic activity in the cecum is present. Whereas on the prior exam this may have resembled physiologic activity, on today' s follow up PET this exact region appears to persist, and there is some increasing conspicuity of wall thickening along the cecum raising concern for colon cancer. Colonoscopy with special attention to the cecum is recommended. 3. Scattered lung nodules and regions of what appear to be rounded atelectasis in both lungs, mostly new compared to the prior exam, some of the nodularity might be attributable to atypical infectious process. I am skeptical that this is due to malignancy given the very minimal metabolic activity, but surveillance is likely warranted. 4. Mild splenomegaly but the hypermetabolic activity diffusely in the spleen on the prior exam has resolved. 5. Aortic Atherosclerosis (ICD10-I70.0). Coronary atherosclerosis with mild cardiomegaly. 6. Pleural calcifications on the right, with small right pleural effusion. 7.  Prominent stool throughout the colon favors constipation. 8. New superior endplate compression fracture at L4, likely benign.   Electronically Signed   By: Van Clines M.D.   On: 10/20/2016 10:09   RADIOGRAPHIC STUDIES: I have personally reviewed the radiological images as listed and agreed with the findings in the report. No results found.  ASSESSMENT & PLAN:   Large cell lymphoma of axillary lymph nodes ([place]) # STAGE IV- Diffuse large B-cell lymphoma-left axillary lymph node; Positive for myc gene re-arrangement 8:14  translocation. [? Transformed lymphoma]. Status post 3 cycles of Rituxan and prednisone-PET scan significant improved response. Currently status post cycle #5  of Rituxan-prednisone today-approximately 2 weeks ago. No concerns for any progression.  # Difficulty swallowing pain/difficulty swallowing- no thrush noted. Question mucositis. Recommend magic mouthwash. Prescription given.  # Caecum uptake-question polyp versus primary colon malignancy. Awaiting repeat evaluation with Dr. Jamal Collin.  # A. Fib-aroxysmal/rate controlled- on aspirin only.  # Patient recommended follow up as planned for cycle #6 of treatment.      Cammie Sickle, MD 11/22/2016 9:58 PM

## 2016-12-05 ENCOUNTER — Inpatient Hospital Stay: Payer: Medicare Other

## 2016-12-05 ENCOUNTER — Inpatient Hospital Stay: Payer: Medicare Other | Admitting: Internal Medicine

## 2016-12-05 NOTE — Progress Notes (Deleted)
[place] NOTE  Patient Care Team: Sharyne Peach, MD as PCP - General (Family Medicine) Shela Nevin, MD as Attending Physician (Cardiology) Cammie Sickle, MD as Consulting Physician (Internal Medicine) Christene Lye, MD as Surgeon (General Surgery)  CHIEF COMPLAINTS/PURPOSE OF CONSULTATION:   Oncology History   # MARCH- April 2018[facility] STAGE IV- [EBV positive; R. Ax LN; Dr.Sankar]; ? ABC subtype;POSITIVE FOR MYC GENE-RE-ARRANAGMENT.  FEB 20th 2018- PET Borderline enlarged hypermetabolic lymph nodes ] throughout the neck, chest, abdomen and pelvis, together with hypermetabolic Splenomegaly.   # April 2018- R-Pred [hx of ICMP; April 2018- EF65%].    # 2014- THROMBOCYTOPENIA [130s-140s] sec to splenomegaly; Feb- 2017- 107/N-Hb [hep B/C/HIV- Neg]; US-moderate splenomegaly. March 2017- BMBx- Megakaryocytosis/ otherwise no obvious dyspoiesis/ or malignancy; no increase in reticulin fibrosis; cytogenetics-n.    # 2017- ? 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 He is accompanied by his daughter-in-law.  Gary Ferrell 81 y.o.  male Caucasian patient With newly diagnosed mild to moderate dementia and with above history of diffuse B-cell lymphoma [? Transformed] currently status post 4 cycles of rituximab-prednisone. He had his last treatment approximately 2 weeks ago.  As per family patient noted to have difficulty swallowing/pain with swallowing in the last 2-3 days. Patient was recently treated with nystatin for thrush.   Patient  continues to be nursing home/rehabilitation. No fever or chills. Gaining weight. No skin lesions. Otherwise no nausea no vomiting. Appetite improving. Otherwise denies any new lumps or bumps.  ROS: Difficult to assess given patient's dementia. MEDICAL HISTORY:  Past Medical History:  Diagnosis Date  . AICD (automatic cardioverter/defibrillator) present   . BPH (benign prostatic hyperplasia)    a. 06/2012 s/p Greenlight Laser Prostatectomy.  . Carotid disease, bilateral ([place])    a. 1990's s/p L CEA;  b. 11/2004 s/p redo L CEA;  c. 06/2013 Carotid U/S: RICA 72-53%, LICA patent.  . Chronic systolic CHF (congestive heart failure) ([place])    a. 04/2013 Echo: EF 35%, inf DK, inflat AK, diast dysfxn, RV HK, mild MR, nmildly dil LA.  Marland Kitchen CINV (chemotherapy-induced nausea and vomiting)   . CKD (chronic kidney disease), stage II   . Coronary artery disease    a. 2007 s/p MI & CABG.  . Dementia   . Diverticulosis    a. 10/2013 colonoscopy (otw nl study).  Marland Kitchen Dyspnea   . GERD (gastroesophageal reflux disease)   . Hyperlipidemia   . Hypertension   . Ischemic cardiomyopathy    a. 04/2013 Echo: EF 35%;  b. 06/2013 s/p SJM Bi-V ICD, ser # [id number].  . Large B-cell lymphoma ([place]) 06/16/2016  . LBBB (left bundle branch block)   . Lung nodules   . Myocardial infarction ([place])   . OSA (obstructive sleep apnea)    a. 12/2013 Sleep study: severe OSA - CPAP-   . PAD (peripheral artery disease) ([place])    a. 1990's s/p Aortobifem bypass.  . Presence of permanent cardiac pacemaker   . Spinal stenosis   . Stricture of urethral meatus  a. 08/2009 s/p dil.  . Stroke [facility])    a. 2007 Periop CABG - no residual.    SURGICAL HISTORY: Past Surgical History:  Procedure Laterality Date  . AORTA - BILATERAL FEMORAL ARTERY BYPASS GRAFT  1992  . APPENDECTOMY  1964  . AXILLARY LYMPH NODE BIOPSY Left 06/16/2016   LARGE B-CELL LYMPHOMA/ AXILLARY LYMPH NODE BIOPSY;  Surgeon: Christene Lye, MD;  Location: [facility] ORS;   Service: General;  Laterality: Left;  . BI-VENTRICULAR IMPLANTABLE CARDIOVERTER DEFIBRILLATOR N/A 06/25/2013   Procedure: BI-VENTRICULAR IMPLANTABLE CARDIOVERTER DEFIBRILLATOR  (CRT-D);  Surgeon: Evans Lance, MD;  Location: [facility] CATH LAB;  Service: Cardiovascular;  Laterality: N/A;  . BI-VENTRICULAR IMPLANTABLE CARDIOVERTER DEFIBRILLATOR  (CRT-D)  06/25/2013  . BI-VENTRICULAR IMPLANTABLE CARDIOVERTER DEFIBRILLATOR  (CRT-D)  06/25/13   STJ CRTD implanted by Dr Lovena Le  . CARDIAC CATHETERIZATION  2007; 2012   "unsuccessful attempt to put stents in in 2007, had MI, had to have CABG;"  . CAROTID ENDARTERECTOMY Left 1992; 2006  . CHOLECYSTECTOMY  ~ 1996  . COLON SURGERY  2007   herniated intestinal obstruction  . CORONARY ARTERY BYPASS GRAFT  2007   "CABG X3" (06/25/2013)  . GREEN LIGHT LASER TURP (TRANSURETHRAL RESECTION OF PROSTATE N/A 07/02/2012   Procedure: GREEN LIGHT LASER TURP (TRANSURETHRAL RESECTION OF PROSTATE;  Surgeon: Ailene Rud, MD;  Location: [facility] ORS;  Service: Urology;  Laterality: N/A;  . PORTACATH PLACEMENT N/A 07/20/2016   Procedure: INSERTION PORT-A-CATH;  Surgeon: Christene Lye, MD;  Location: [facility] ORS;  Service: General;  Laterality: N/A;  . SKIN GRAFT  2008   "area of colon surgery wouldn't heal; had this graft over area"  . TONSILLECTOMY    . URETHRAL MEATOPLASTY  08/2009  . URETHRAL STRICTURE DILATATION  02/2010; 09/2010  . VENTRAL HERNIA REPAIR  2008   ventral epigastric hernia repair [Other]    SOCIAL HISTORY:  Patient currently lives at [facility] in [place]. Quit smoking 1992;  No alcohol. .  Retired Medical illustrator.  Social History   Social History  . Marital status: Married    Spouse name: N/A  . Number of children: 1  . Years of education: N/A   Occupational History  . Retired    Social History Main Topics  . Smoking status: Former Smoker    Packs/day: 1.00    Years: 20.00    Types: Cigarettes    Quit date: 04/11/1989   . Smokeless tobacco: Never Used  . Alcohol use No  . Drug use: No  . Sexual activity: No   Other Topics Concern  . Not on file   Social History Narrative  . No narrative on file    FAMILY HISTORY: Family History  Problem Relation Age of Onset  . Pancreatic cancer Father   . Diabetes Brother   . Heart disease Brother   . Colon cancer Maternal Grandmother   . Parkinson's disease Mother   . Lung cancer Brother        stage 4    ALLERGIES:  is allergic to dapsone.  MEDICATIONS:  Current Outpatient Prescriptions  Medication Sig Dispense Refill  . aspirin EC 81 MG tablet Take 81 mg by mouth daily.    . carvedilol (COREG) 3.125 MG tablet TAKE 1 TABLET BY MOUTH 2 (TWO) TIMES DAILY WITH MEALS. (Patient taking differently: TAKE 1/2 TABLET BY MOUTH 2 (TWO) TIMES DAILY WITH MEALS.) 180 tablet 1  . cholecalciferol (VITAMIN D) 1000 UNITS tablet Take 1,000 Units by mouth  daily.    . finasteride (PROSCAR) 5 MG tablet Take 5 mg by mouth daily.    . hydrocortisone 2.5 % cream Apply 1 application topically 2 (two) times daily as needed (itching).    Marland Kitchen lidocaine-prilocaine (EMLA) cream Apply 1 application topically as needed. Apply to port cath site 1 hour prior to access. 30 g 3  . magic mouthwash w/lidocaine SOLN Take 5 mLs by mouth 4 (four) times daily. 240 mL 3  . Multiple Vitamin (MULTIVITAMIN WITH MINERALS) TABS Take 1 tablet by mouth daily.    . Mupirocin 2 % KIT Apply 1 application topically as needed.    . ondansetron (ZOFRAN) 8 MG tablet Take one pill every 8 hours as needed for nasuea/ vomitting. 40 tablet 1  . polyethylene glycol powder (GLYCOLAX/MIRALAX) powder 255 grams one bottle for colonoscopy prep (Patient not taking: Reported on 11/22/2016) 255 g 0  . predniSONE (DELTASONE) 50 MG tablet Pt to take 2 tablets daily for 5 days starting on the day of chemotherapy regimen.   This rx will need to be with each treatment. Take in AM with food. (Patient not taking: Reported on  11/22/2016) 10 tablet 3  . prochlorperazine (COMPAZINE) 10 MG tablet Take 1 tablet (10 mg total) by mouth every 6 (six) hours as needed for nausea or vomiting. 40 tablet 1  . rosuvastatin (CRESTOR) 20 MG tablet Take 20 mg by mouth every morning. Reported on 06/16/2015    . tamsulosin (FLOMAX) 0.4 MG CAPS capsule Take 1 capsule by mouth daily.    . vitamin C (ASCORBIC ACID) 250 MG tablet Take 250 mg by mouth daily.     No current facility-administered medications for this visit.       Marland Kitchen  PHYSICAL EXAMINATION: ECOG PERFORMANCE STATUS: 0 - Asymptomatic  There were no vitals filed for this visit. There were no vitals filed for this visit.   GENERAL: Well-nourished well-developed; Alert, no distress and comfortable.  Accompanied by his daughter-in-law.Patient is walking himself. EYES: no pallor or icterus OROPHARYNX: no ulceration; good dentition.No thrush noted. NECK: supple, no masses felt LYMPH:  no palpable lymphadenopathy in the cervical,or inguinal regions; LN excision noted  LUNGS: clear to auscultation and  No wheeze or crackles HEART/CVS: regular rate & rhythm and no murmurs; No lower extremity edema ABDOMEN: abdomen soft, non-tender and normal bowel sounds; positive for splenomegaly.  Musculoskeletal:no cyanosis of digits and no clubbing  PSYCH: alert & oriented x 3; Flat affect. NEURO: no focal motor/sensory deficits SKIN: Skin rash completely resolved.      LABORATORY DATA:  I have reviewed the data as listed Lab Results  Component Value Date   WBC 5.0 11/14/2016   HGB 11.9 (L) 11/14/2016   HCT 34.4 (L) 11/14/2016   MCV 82.8 11/14/2016   PLT 162 11/14/2016    Recent Labs  10/03/16 0910 10/24/16 0842 11/14/16 0828  NA 138 138 136  K 3.9 4.1 4.1  CL 104 105 102  CO2 _0 GLUCOSE 180* 129* 129*  BUN _1 CREATININE 0.61 0.62 0.66  CALCIUM 8.9 9.1 8.8*  GFRNONAA >60 >60 >60  GFRAA >60 >60 >60  PROT 4.9* 5.3* 5.1*  ALBUMIN 3.2* 3.6 3.5  AST _2 ALT 19 17 15*  ALKPHOS 106 115 94  BILITOT 1.4* 1.6* 1.3*     IMPRESSION: 1. Marked improvement and in most regions complete resolution of the prior adenopathy in the neck, chest, abdomen, and pelvis. Most  of these regions are [place] 1. A subcarinal lymph node is De[place] 3 but is now normal in size. 2. Hypermetabolic activity in the cecum is present. Whereas on the prior exam this may have resembled physiologic activity, on today' s follow up PET this exact region appears to persist, and there is some increasing conspicuity of wall thickening along the cecum raising concern for colon cancer. Colonoscopy with special attention to the cecum is recommended. 3. Scattered lung nodules and regions of what appear to be rounded atelectasis in both lungs, mostly new compared to the prior exam, some of the nodularity might be attributable to atypical infectious process. I am skeptical that this is due to malignancy given the very minimal metabolic activity, but surveillance is likely warranted. 4. Mild splenomegaly but the hypermetabolic activity diffusely in the spleen on the prior exam has resolved. 5. Aortic Atherosclerosis (ICD10-I70.0). Coronary atherosclerosis with mild cardiomegaly. 6. Pleural calcifications on the right, with small right pleural effusion. 7.  Prominent stool throughout the colon favors constipation. 8. New superior endplate compression fracture at L4, likely benign.   Electronically Signed   By: Van Clines M.D.   On: 10/20/2016 10:09   RADIOGRAPHIC STUDIES: I have personally reviewed the radiological images as listed and agreed with the findings in the report. No results found.  ASSESSMENT & PLAN:   No problem-specific Assessment & Plan notes found for this encounter.     Cammie Sickle, MD 12/05/2016 8:21 AM

## 2016-12-05 NOTE — Assessment & Plan Note (Deleted)
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 [? Transformed lymphoma]. Status post 3 cycles of Rituxan and prednisone-PET scan significant improved response. Currently status post cycle #5  of Rituxan-prednisone today-approximately 2 weeks ago. No concerns for any progression.  # Difficulty swallowing pain/difficulty swallowing- no thrush noted. Question mucositis. Recommend magic mouthwash. Prescription given.  # Caecum uptake-question polyp versus primary colon malignancy. Awaiting repeat evaluation with Dr. Jamal Collin.  # A. Fib-aroxysmal/rate controlled- on aspirin only.  # Patient recommended follow up as planned for cycle #6 of treatment.

## 2016-12-07 ENCOUNTER — Other Ambulatory Visit: Payer: Self-pay | Admitting: *Deleted

## 2016-12-07 ENCOUNTER — Telehealth: Payer: Self-pay | Admitting: *Deleted

## 2016-12-07 DIAGNOSIS — C8584 Other specified types of non-Hodgkin lymphoma, lymph nodes of axilla and upper limb: Secondary | ICD-10-CM

## 2016-12-07 MED ORDER — MAGIC MOUTHWASH W/LIDOCAINE: 5.0000 mL | Freq: Four times a day (QID) | ORAL | 3 refills | Status: DC

## 2016-12-07 NOTE — Telephone Encounter (Signed)
Gary Ferrell at [facility] facility called regarding orders for magic mouth wash. Patient's script for magic mouth wash was sent to [facility] pharmacy. Will need new rx sent to    [facility] [place], [place] - [address] [phone] (Phone) [phone] (Fax)   This med could then be mailed to the facility.  New RX printed and faxed.

## 2016-12-08 NOTE — Telephone Encounter (Signed)
New rx for magic mouthwash signed by Dr. Jacinto Reap and faxed to preferred pharmacy

## 2016-12-09 ENCOUNTER — Inpatient Hospital Stay: Payer: Medicare Other

## 2016-12-09 ENCOUNTER — Inpatient Hospital Stay (HOSPITAL_BASED_OUTPATIENT_CLINIC_OR_DEPARTMENT_OTHER): Payer: Medicare Other | Admitting: Internal Medicine

## 2016-12-09 VITALS — BP 135/65 | HR 72 | Temp 97.8°F | Resp 20 | Ht 71.0 in | Wt 156.7 lb

## 2016-12-09 DIAGNOSIS — D801 Nonfamilial hypogammaglobulinemia: Secondary | ICD-10-CM | POA: Diagnosis not present

## 2016-12-09 DIAGNOSIS — N400 Enlarged prostate without lower urinary tract symptoms: Secondary | ICD-10-CM

## 2016-12-09 DIAGNOSIS — C8584 Other specified types of non-Hodgkin lymphoma, lymph nodes of axilla and upper limb: Secondary | ICD-10-CM

## 2016-12-09 DIAGNOSIS — C8334 Diffuse large B-cell lymphoma, lymph nodes of axilla and upper limb: Secondary | ICD-10-CM | POA: Diagnosis not present

## 2016-12-09 DIAGNOSIS — Z7982 Long term (current) use of aspirin: Secondary | ICD-10-CM

## 2016-12-09 DIAGNOSIS — Z95810 Presence of automatic (implantable) cardiac defibrillator: Secondary | ICD-10-CM

## 2016-12-09 DIAGNOSIS — B370 Candidal stomatitis: Secondary | ICD-10-CM | POA: Diagnosis not present

## 2016-12-09 DIAGNOSIS — R918 Other nonspecific abnormal finding of lung field: Secondary | ICD-10-CM | POA: Diagnosis not present

## 2016-12-09 DIAGNOSIS — F0390 Unspecified dementia without behavioral disturbance: Secondary | ICD-10-CM

## 2016-12-09 DIAGNOSIS — I700 Atherosclerosis of aorta: Secondary | ICD-10-CM | POA: Diagnosis not present

## 2016-12-09 DIAGNOSIS — R1310 Dysphagia, unspecified: Secondary | ICD-10-CM

## 2016-12-09 DIAGNOSIS — Z951 Presence of aortocoronary bypass graft: Secondary | ICD-10-CM

## 2016-12-09 DIAGNOSIS — N182 Chronic kidney disease, stage 2 (mild): Secondary | ICD-10-CM

## 2016-12-09 DIAGNOSIS — I130 Hypertensive heart and chronic kidney disease with heart failure and stage 1 through stage 4 chronic kidney disease, or unspecified chronic kidney disease: Secondary | ICD-10-CM

## 2016-12-09 DIAGNOSIS — R161 Splenomegaly, not elsewhere classified: Secondary | ICD-10-CM

## 2016-12-09 DIAGNOSIS — I4891 Unspecified atrial fibrillation: Secondary | ICD-10-CM

## 2016-12-09 DIAGNOSIS — K219 Gastro-esophageal reflux disease without esophagitis: Secondary | ICD-10-CM

## 2016-12-09 DIAGNOSIS — Z79899 Other long term (current) drug therapy: Secondary | ICD-10-CM

## 2016-12-09 DIAGNOSIS — E785 Hyperlipidemia, unspecified: Secondary | ICD-10-CM | POA: Diagnosis not present

## 2016-12-09 DIAGNOSIS — Z7952 Long term (current) use of systemic steroids: Secondary | ICD-10-CM

## 2016-12-09 DIAGNOSIS — I252 Old myocardial infarction: Secondary | ICD-10-CM

## 2016-12-09 DIAGNOSIS — I255 Ischemic cardiomyopathy: Secondary | ICD-10-CM

## 2016-12-09 DIAGNOSIS — I5022 Chronic systolic (congestive) heart failure: Secondary | ICD-10-CM

## 2016-12-09 DIAGNOSIS — I2510 Atherosclerotic heart disease of native coronary artery without angina pectoris: Secondary | ICD-10-CM

## 2016-12-09 DIAGNOSIS — Z5112 Encounter for antineoplastic immunotherapy: Secondary | ICD-10-CM | POA: Diagnosis not present

## 2016-12-09 DIAGNOSIS — Z8673 Personal history of transient ischemic attack (TIA), and cerebral infarction without residual deficits: Secondary | ICD-10-CM

## 2016-12-09 DIAGNOSIS — G4733 Obstructive sleep apnea (adult) (pediatric): Secondary | ICD-10-CM

## 2016-12-09 DIAGNOSIS — Z950 Presence of cardiac pacemaker: Secondary | ICD-10-CM

## 2016-12-09 LAB — COMPREHENSIVE METABOLIC PANEL
ALK PHOS: 99 U/L (ref 38–126)
ALT: 16 U/L — AB (ref 17–63)
AST: 22 U/L (ref 15–41)
Albumin: 3.6 g/dL (ref 3.5–5.0)
Anion gap: 5 (ref 5–15)
BUN: 13 mg/dL (ref 6–20)
CALCIUM: 8.8 mg/dL — AB (ref 8.9–10.3)
CHLORIDE: 103 mmol/L (ref 101–111)
CO2: 29 mmol/L (ref 22–32)
CREATININE: 0.74 mg/dL (ref 0.61–1.24)
GFR calc Af Amer: >60 mL/min (ref >60)
Glucose, Bld: 189 mg/dL — ABNORMAL HIGH (ref 65–99)
Potassium: 3.9 mmol/L (ref 3.5–5.1)
Sodium: 137 mmol/L (ref 135–145)
TOTAL PROTEIN: 5.2 g/dL — AB (ref 6.5–8.1)
Total Bilirubin: 1.1 mg/dL (ref 0.3–1.2)

## 2016-12-09 LAB — CBC WITH DIFFERENTIAL/PLATELET
Basophils Absolute: 0 10*3/uL (ref 0–0.1)
Basophils Relative: 1 %
EOS PCT: 0 %
Eosinophils Absolute: 0 10*3/uL (ref 0–0.7)
HCT: 34.9 % — ABNORMAL LOW (ref 40.0–52.0)
Hemoglobin: 11.9 g/dL — ABNORMAL LOW (ref 13.0–18.0)
LYMPHS ABS: 1 10*3/uL (ref 1.0–3.6)
Lymphocytes Relative: 24 %
MCH: 28.1 pg (ref 26.0–34.0)
MCHC: 34.3 g/dL (ref 32.0–36.0)
MCV: 82 fL (ref 80.0–100.0)
Monocytes Absolute: 0.8 10*3/uL (ref 0.2–1.0)
Monocytes Relative: 18 %
Neutro Abs: 2.5 10*3/uL (ref 1.4–6.5)
Neutrophils Relative %: 57 %
PLATELETS: 169 10*3/uL (ref 150–440)
RBC: 4.25 MIL/uL — AB (ref 4.40–5.90)
RDW: 14.6 % — ABNORMAL HIGH (ref 11.5–14.5)
WBC: 4.3 10*3/uL (ref 3.8–10.6)

## 2016-12-09 LAB — LACTATE DEHYDROGENASE: LDH: 166 U/L (ref 98–192)

## 2016-12-09 MED ORDER — SODIUM CHLORIDE 0.9 % IV SOLN
Freq: Once | INTRAVENOUS | Status: AC
  Administered 2016-12-09: 10:00:00 via INTRAVENOUS
  Filled 2016-12-09: qty 1000

## 2016-12-09 MED ORDER — SODIUM CHLORIDE 0.9% FLUSH
10.0000 mL | INTRAVENOUS | Status: DC | PRN
  Administered 2016-12-09: 10 mL
  Filled 2016-12-09: qty 10

## 2016-12-09 MED ORDER — SODIUM CHLORIDE 0.9 % IV SOLN
375.0000 mg/m2 | Freq: Once | INTRAVENOUS | Status: AC
  Administered 2016-12-09: 700 mg via INTRAVENOUS
  Filled 2016-12-09: qty 50

## 2016-12-09 MED ORDER — ACETAMINOPHEN 325 MG PO TABS
650.0000 mg | ORAL_TABLET | Freq: Once | ORAL | Status: AC
  Administered 2016-12-09: 650 mg via ORAL
  Filled 2016-12-09: qty 2

## 2016-12-09 MED ORDER — HEPARIN SOD (PORK) LOCK FLUSH 100 UNIT/ML IV SOLN
500.0000 [IU] | Freq: Once | INTRAVENOUS | Status: AC | PRN
  Administered 2016-12-09: 500 [IU]
  Filled 2016-12-09: qty 5

## 2016-12-09 MED ORDER — DIPHENHYDRAMINE HCL 25 MG PO CAPS
25.0000 mg | ORAL_CAPSULE | Freq: Once | ORAL | Status: AC
  Administered 2016-12-09: 25 mg via ORAL
  Filled 2016-12-09: qty 1

## 2016-12-09 MED ORDER — PREDNISONE 50 MG PO TABS: ORAL_TABLET | ORAL | 0 refills | Status: DC

## 2016-12-09 NOTE — Assessment & Plan Note (Addendum)
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 [? Transformed lymphoma]. Status post 3 cycles of Rituxan and prednisone-PET scan significant improved response.  Currently status post cycle #5  of Rituxan-prednisone today-approximately 3 weeks ago. No concerns for any progression.  # Proceed with cycle # 6 today; with prednisone. New script given. Discussed re: PET scan in 8 weeks; also re: maintenance vs versus treatment with Revlimid-rituximab on progression.  # Difficulty swallowing pain/difficulty swallowing-mucositis- improved. Continue magic mouthwash. Prescription given.  # Caecum uptake-question polyp versus primary colon malignancy. s/p evaluation with Dr. Jamal Collin.awiating colonoscopy on sep 11th.  # A. Fib-aroxysmal/rate controlled- on aspirin only.  # follow up with me/labs/LDH in 4 weeks. Will order PET scan at that time.

## 2016-12-11 NOTE — Progress Notes (Signed)
[place] NOTE  Patient Care Team: Sharyne Peach, MD as PCP - General (Family Medicine) Shela Nevin, MD as Attending Physician (Cardiology) Cammie Sickle, MD as Consulting Physician (Internal Medicine) Christene Lye, MD as Surgeon (General Surgery)  CHIEF COMPLAINTS/PURPOSE OF CONSULTATION:   Oncology History   # MARCH- April 2018[facility] STAGE IV- [EBV positive; R. Ax LN; Dr.Sankar]; ? ABC subtype;POSITIVE FOR MYC GENE-RE-ARRANAGMENT.  FEB 20th 2018- PET Borderline enlarged hypermetabolic lymph nodes ] throughout the neck, chest, abdomen and pelvis, together with hypermetabolic Splenomegaly.   # April 2018- R-Pred [hx of ICMP; April 2018- EF65%].    # 2014- THROMBOCYTOPENIA [130s-140s] sec to splenomegaly; Feb- 2017- 107/N-Hb [hep B/C/HIV- Neg]; US-moderate splenomegaly. March 2017- BMBx- Megakaryocytosis/ otherwise no obvious dyspoiesis/ or malignancy; no increase in reticulin fibrosis; cytogenetics-n.    # 2017- ? 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 He is accompanied by his daughter-in-law.  Gary Ferrell 81 y.o.  male Caucasian patient With newly diagnosed mild to moderate dementia and with above history of diffuse B-cell lymphoma [? Transformed] currently status post 5 cycles of rituximab-prednisone. He had his last treatment approximately 3 weeks ago.  Patient swallowing difficulty has improved. He is using Magic mouthwash intermittently. Patient continues to be nursing home/rehabilitation. No fever or chills. Gaining  weight. No skin lesions. Otherwise no nausea no vomiting. Appetite improving. Otherwise denies any new lumps or bumps. No swelling in the legs. No recent infections otherwise.  ROS: Difficult to assess given patient's dementia. MEDICAL HISTORY:  Past Medical History:  Diagnosis Date  . AICD (automatic cardioverter/defibrillator) present   . BPH (benign prostatic hyperplasia)    a. 06/2012 s/p Greenlight Laser Prostatectomy.  . Carotid disease, bilateral ([place])    a. 1990's s/p L CEA;  b. 11/2004 s/p redo L CEA;  c. 06/2013 Carotid U/S: RICA 25-85%, LICA patent.  . Chronic systolic CHF (congestive heart failure) ([place])    a. 04/2013 Echo: EF 35%, inf DK, inflat AK, diast dysfxn, RV HK, mild MR, nmildly dil LA.  Marland Kitchen CINV (chemotherapy-induced nausea and vomiting)   . CKD (chronic kidney disease), stage II   . Coronary artery disease    a. 2007 s/p MI & CABG.  . Dementia   . Diverticulosis    a. 10/2013 colonoscopy (otw nl study).  Marland Kitchen Dyspnea   . GERD (gastroesophageal reflux disease)   . Hyperlipidemia   . Hypertension   . Ischemic cardiomyopathy    a. 04/2013 Echo: EF 35%;  b. 06/2013 s/p SJM Bi-V ICD, ser # [id number].  . Large B-cell lymphoma ([place]) 06/16/2016  . LBBB (left bundle branch block)   . Lung nodules   . Myocardial infarction ([place])   . OSA (obstructive sleep apnea)    a. 12/2013 Sleep study: severe OSA - CPAP-   . PAD (peripheral artery disease) ([place])    a. 1990's s/p Aortobifem bypass.  . Presence of permanent cardiac pacemaker   . Spinal stenosis   . Stricture of urethral meatus    a. 08/2009 s/p dil.  Marland Kitchen  Stroke [facility])    a. 2007 Periop CABG - no residual.    SURGICAL HISTORY: Past Surgical History:  Procedure Laterality Date  . AORTA - BILATERAL FEMORAL ARTERY BYPASS GRAFT  1992  . APPENDECTOMY  1964  . AXILLARY LYMPH NODE BIOPSY Left 06/16/2016   LARGE B-CELL LYMPHOMA/ AXILLARY LYMPH NODE BIOPSY;  Surgeon: Christene Lye, MD;  Location: [facility] ORS;  Service: General;   Laterality: Left;  . BI-VENTRICULAR IMPLANTABLE CARDIOVERTER DEFIBRILLATOR N/A 06/25/2013   Procedure: BI-VENTRICULAR IMPLANTABLE CARDIOVERTER DEFIBRILLATOR  (CRT-D);  Surgeon: Evans Lance, MD;  Location: [facility] CATH LAB;  Service: Cardiovascular;  Laterality: N/A;  . BI-VENTRICULAR IMPLANTABLE CARDIOVERTER DEFIBRILLATOR  (CRT-D)  06/25/2013  . BI-VENTRICULAR IMPLANTABLE CARDIOVERTER DEFIBRILLATOR  (CRT-D)  06/25/13   STJ CRTD implanted by Dr Lovena Le  . CARDIAC CATHETERIZATION  2007; 2012   "unsuccessful attempt to put stents in in 2007, had MI, had to have CABG;"  . CAROTID ENDARTERECTOMY Left 1992; 2006  . CHOLECYSTECTOMY  ~ 1996  . COLON SURGERY  2007   herniated intestinal obstruction  . CORONARY ARTERY BYPASS GRAFT  2007   "CABG X3" (06/25/2013)  . GREEN LIGHT LASER TURP (TRANSURETHRAL RESECTION OF PROSTATE N/A 07/02/2012   Procedure: GREEN LIGHT LASER TURP (TRANSURETHRAL RESECTION OF PROSTATE;  Surgeon: Ailene Rud, MD;  Location: [facility] ORS;  Service: Urology;  Laterality: N/A;  . PORTACATH PLACEMENT N/A 07/20/2016   Procedure: INSERTION PORT-A-CATH;  Surgeon: Christene Lye, MD;  Location: [facility] ORS;  Service: General;  Laterality: N/A;  . SKIN GRAFT  2008   "area of colon surgery wouldn't heal; had this graft over area"  . TONSILLECTOMY    . URETHRAL MEATOPLASTY  08/2009  . URETHRAL STRICTURE DILATATION  02/2010; 09/2010  . VENTRAL HERNIA REPAIR  2008   ventral epigastric hernia repair [Other]    SOCIAL HISTORY:  Patient currently lives at [facility] in [place]. Quit smoking 1992;  No alcohol. .  Retired Medical illustrator.  Social History   Social History  . Marital status: Married    Spouse name: N/A  . Number of children: 1  . Years of education: N/A   Occupational History  . Retired    Social History Main Topics  . Smoking status: Former Smoker    Packs/day: 1.00    Years: 20.00    Types: Cigarettes    Quit date: 04/11/1989  . Smokeless  tobacco: Never Used  . Alcohol use No  . Drug use: No  . Sexual activity: No   Other Topics Concern  . Not on file   Social History Narrative  . No narrative on file    FAMILY HISTORY: Family History  Problem Relation Age of Onset  . Pancreatic cancer Father   . Diabetes Brother   . Heart disease Brother   . Colon cancer Maternal Grandmother   . Parkinson's disease Mother   . Lung cancer Brother        stage 4    ALLERGIES:  is allergic to dapsone.  MEDICATIONS:  Current Outpatient Prescriptions  Medication Sig Dispense Refill  . aspirin EC 81 MG tablet Take 81 mg by mouth daily.    . carvedilol (COREG) 3.125 MG tablet TAKE 1 TABLET BY MOUTH 2 (TWO) TIMES DAILY WITH MEALS. (Patient taking differently: TAKE 1/2 TABLET BY MOUTH 2 (TWO) TIMES DAILY WITH MEALS.) 180 tablet 1  . cholecalciferol (VITAMIN D) 1000 UNITS tablet Take 1,000 Units by mouth daily.    . diclofenac (  VOLTAREN) 0.1 % ophthalmic solution Apply 1 drop to eye 4 (four) times daily. Both eyes    . donepezil (ARICEPT) 5 MG tablet Take 1 tablet by mouth at bedtime.    . finasteride (PROSCAR) 5 MG tablet Take 5 mg by mouth daily.    . magic mouthwash w/lidocaine SOLN Take 5 mLs by mouth 4 (four) times daily. 240 mL 3  . Multiple Vitamin (MULTIVITAMIN WITH MINERALS) TABS Take 1 tablet by mouth daily.    . rosuvastatin (CRESTOR) 20 MG tablet Take 20 mg by mouth every morning. Reported on 06/16/2015    . tamsulosin (FLOMAX) 0.4 MG CAPS capsule Take 1 capsule by mouth daily.    Marland Kitchen acetaminophen (TYLENOL) 500 MG tablet Take 1,000 mg by mouth every 4 (four) hours as needed for mild pain or moderate pain.    Marland Kitchen alum & mag hydroxide-simeth (MAALOX/MYLANTA) 200-200-20 MG/5ML suspension Take 30 mLs by mouth every 6 (six) hours as needed for indigestion or heartburn.    . lidocaine-prilocaine (EMLA) cream Apply 1 application topically as needed. Apply to port cath site 1 hour prior to access. (Patient not taking: Reported on  12/09/2016) 30 g 3  . loperamide (IMODIUM A-D) 2 MG tablet Take 2 mg by mouth 4 (four) times daily as needed for diarrhea or loose stools.    . magnesium hydroxide (MILK OF MAGNESIA) 400 MG/5ML suspension Take 30 mLs by mouth daily as needed for mild constipation.    Marland Kitchen Neomycin-Bacitracin-Polymyxin (NEOPORACIN EX) Apply 1 application topically every 12 (twelve) hours as needed (abrasion).    . ondansetron (ZOFRAN) 8 MG tablet Take one pill every 8 hours as needed for nasuea/ vomitting. (Patient not taking: Reported on 12/09/2016) 40 tablet 1  . predniSONE (DELTASONE) 50 MG tablet Pt to take 2 tablets daily for 5 days starting on the day of chemotherapy regimen.   This rx will need to be with each treatment. Take in AM with food. 10 tablet 0   No current facility-administered medications for this visit.       Marland Kitchen  PHYSICAL EXAMINATION: ECOG PERFORMANCE STATUS: 0 - Asymptomatic  Vitals:   12/09/16 0830  BP: 135/65  Pulse: 72  Resp: 20  Temp: 97.8 F (36.6 C)   Filed Weights   12/09/16 0841  Weight: 156 lb 11.2 oz (71.1 kg)     GENERAL: Well-nourished well-developed; Alert, no distress and comfortable.  Accompanied by his daughter-in-law.Patient is walking himself. EYES: no pallor or icterus OROPHARYNX: no ulceration; good dentition.No thrush noted. NECK: supple, no masses felt LYMPH:  no palpable lymphadenopathy in the cervical,or inguinal regions; LN excision noted  LUNGS: clear to auscultation and  No wheeze or crackles HEART/CVS: regular rate & rhythm and no murmurs; No lower extremity edema ABDOMEN: abdomen soft, non-tender and normal bowel sounds; positive for splenomegaly.  Musculoskeletal:no cyanosis of digits and no clubbing  PSYCH: alert & oriented x 3; Flat affect. NEURO: no focal motor/sensory deficits SKIN: Skin rash completely resolved.      LABORATORY DATA:  I have reviewed the data as listed Lab Results  Component Value Date   WBC 4.3 12/09/2016   HGB 11.9  (L) 12/09/2016   HCT 34.9 (L) 12/09/2016   MCV 82.0 12/09/2016   PLT 169 12/09/2016    Recent Labs  10/24/16 0842 11/14/16 0828 12/09/16 0815  NA 138 136 137  K 4.1 4.1 3.9  CL 105 102 103  CO2 30 27 29   GLUCOSE 129* 129* 189*  BUN 12  15 13  CREATININE 0.62 0.66 0.74  CALCIUM 9.1 8.8* 8.8*  GFRNONAA >60 >60 >60  GFRAA >60 >60 >60  PROT 5.3* 5.1* 5.2*  ALBUMIN 3.6 3.5 3.6  AST 21 18 22   ALT 17 15* 16*  ALKPHOS 115 94 99  BILITOT 1.6* 1.3* 1.1     IMPRESSION: 1. Marked improvement and in most regions complete resolution of the prior adenopathy in the neck, chest, abdomen, and pelvis. Most of these regions are [place] 1. A subcarinal lymph node is [place] 3 but is now normal in size. 2. Hypermetabolic activity in the cecum is present. Whereas on the prior exam this may have resembled physiologic activity, on today' s follow up PET this exact region appears to persist, and there is some increasing conspicuity of wall thickening along the cecum raising concern for colon cancer. Colonoscopy with special attention to the cecum is recommended. 3. Scattered lung nodules and regions of what appear to be rounded atelectasis in both lungs, mostly new compared to the prior exam, some of the nodularity might be attributable to atypical infectious process. I am skeptical that this is due to malignancy given the very minimal metabolic activity, but surveillance is likely warranted. 4. Mild splenomegaly but the hypermetabolic activity diffusely in the spleen on the prior exam has resolved. 5. Aortic Atherosclerosis (ICD10-I70.0). Coronary atherosclerosis with mild cardiomegaly. 6. Pleural calcifications on the right, with small right pleural effusion. 7.  Prominent stool throughout the colon favors constipation. 8. New superior endplate compression fracture at L4, likely benign.   Electronically Signed   By: Van Clines M.D.   On: 10/20/2016  10:09   RADIOGRAPHIC STUDIES: I have personally reviewed the radiological images as listed and agreed with the findings in the report. No results found.  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 [? Transformed lymphoma]. Status post 3 cycles of Rituxan and prednisone-PET scan significant improved response.  Currently status post cycle #5  of Rituxan-prednisone today-approximately 3 weeks ago. No concerns for any progression.  # Proceed with cycle # 6 today; with prednisone. New script given. Discussed re: PET scan in 8 weeks; also re: maintenance vs versus treatment with Revlimid-rituximab on progression.  # Difficulty swallowing pain/difficulty swallowing-mucositis- improved. Continue magic mouthwash. Prescription given.  # Caecum uptake-question polyp versus primary colon malignancy. s/p evaluation with Dr. Jamal Collin.awiating colonoscopy on sep 11th.  # A. Fib-aroxysmal/rate controlled- on aspirin only.  # follow up with me/labs/LDH in 4 weeks. Will order PET scan at that time.      Cammie Sickle, MD 12/11/2016 5:29 AM

## 2016-12-15 ENCOUNTER — Other Ambulatory Visit: Payer: Self-pay | Admitting: General Surgery

## 2016-12-15 DIAGNOSIS — Z86010 Personal history of colonic polyps: Secondary | ICD-10-CM

## 2016-12-15 DIAGNOSIS — R933 Abnormal findings on diagnostic imaging of other parts of digestive tract: Secondary | ICD-10-CM

## 2016-12-20 ENCOUNTER — Ambulatory Visit: Payer: Medicare Other | Admitting: Certified Registered"

## 2016-12-20 ENCOUNTER — Encounter: Payer: Self-pay | Admitting: Anesthesiology

## 2016-12-20 ENCOUNTER — Encounter
Admission: RE | Disposition: A | Discharge status: Home / Self Care | Payer: Self-pay | Source: Ambulatory Visit | Attending: General Surgery

## 2016-12-20 ENCOUNTER — Ambulatory Visit
Admission: RE | Admit: 2016-12-20 | Discharge: 2016-12-20 | Disposition: A | Discharge status: Home / Self Care | Payer: Medicare Other | Source: Ambulatory Visit | Attending: General Surgery | Admitting: General Surgery

## 2016-12-20 DIAGNOSIS — K219 Gastro-esophageal reflux disease without esophagitis: Secondary | ICD-10-CM | POA: Insufficient documentation

## 2016-12-20 DIAGNOSIS — I255 Ischemic cardiomyopathy: Secondary | ICD-10-CM | POA: Insufficient documentation

## 2016-12-20 DIAGNOSIS — D490 Neoplasm of unspecified behavior of digestive system: Secondary | ICD-10-CM

## 2016-12-20 DIAGNOSIS — Z95810 Presence of automatic (implantable) cardiac defibrillator: Secondary | ICD-10-CM | POA: Diagnosis not present

## 2016-12-20 DIAGNOSIS — C180 Malignant neoplasm of cecum: Secondary | ICD-10-CM | POA: Diagnosis not present

## 2016-12-20 DIAGNOSIS — Z86010 Personal history of colonic polyps: Secondary | ICD-10-CM | POA: Insufficient documentation

## 2016-12-20 DIAGNOSIS — R933 Abnormal findings on diagnostic imaging of other parts of digestive tract: Secondary | ICD-10-CM

## 2016-12-20 DIAGNOSIS — I2510 Atherosclerotic heart disease of native coronary artery without angina pectoris: Secondary | ICD-10-CM | POA: Insufficient documentation

## 2016-12-20 DIAGNOSIS — E785 Hyperlipidemia, unspecified: Secondary | ICD-10-CM | POA: Diagnosis not present

## 2016-12-20 DIAGNOSIS — K5730 Diverticulosis of large intestine without perforation or abscess without bleeding: Secondary | ICD-10-CM | POA: Diagnosis not present

## 2016-12-20 DIAGNOSIS — F0390 Unspecified dementia without behavioral disturbance: Secondary | ICD-10-CM | POA: Insufficient documentation

## 2016-12-20 DIAGNOSIS — I130 Hypertensive heart and chronic kidney disease with heart failure and stage 1 through stage 4 chronic kidney disease, or unspecified chronic kidney disease: Secondary | ICD-10-CM | POA: Diagnosis not present

## 2016-12-20 DIAGNOSIS — Z79899 Other long term (current) drug therapy: Secondary | ICD-10-CM | POA: Insufficient documentation

## 2016-12-20 DIAGNOSIS — Z951 Presence of aortocoronary bypass graft: Secondary | ICD-10-CM | POA: Insufficient documentation

## 2016-12-20 DIAGNOSIS — K633 Ulcer of intestine: Secondary | ICD-10-CM | POA: Diagnosis not present

## 2016-12-20 DIAGNOSIS — I252 Old myocardial infarction: Secondary | ICD-10-CM | POA: Insufficient documentation

## 2016-12-20 DIAGNOSIS — I5022 Chronic systolic (congestive) heart failure: Secondary | ICD-10-CM | POA: Diagnosis not present

## 2016-12-20 DIAGNOSIS — N182 Chronic kidney disease, stage 2 (mild): Secondary | ICD-10-CM | POA: Diagnosis not present

## 2016-12-20 DIAGNOSIS — I739 Peripheral vascular disease, unspecified: Secondary | ICD-10-CM | POA: Insufficient documentation

## 2016-12-20 DIAGNOSIS — G4730 Sleep apnea, unspecified: Secondary | ICD-10-CM | POA: Diagnosis not present

## 2016-12-20 DIAGNOSIS — Z8673 Personal history of transient ischemic attack (TIA), and cerebral infarction without residual deficits: Secondary | ICD-10-CM | POA: Diagnosis not present

## 2016-12-20 DIAGNOSIS — G4733 Obstructive sleep apnea (adult) (pediatric): Secondary | ICD-10-CM | POA: Insufficient documentation

## 2016-12-20 DIAGNOSIS — I447 Left bundle-branch block, unspecified: Secondary | ICD-10-CM | POA: Diagnosis not present

## 2016-12-20 DIAGNOSIS — Z800 Family history of malignant neoplasm of digestive organs: Secondary | ICD-10-CM | POA: Diagnosis not present

## 2016-12-20 DIAGNOSIS — Z7982 Long term (current) use of aspirin: Secondary | ICD-10-CM | POA: Insufficient documentation

## 2016-12-20 DIAGNOSIS — N400 Enlarged prostate without lower urinary tract symptoms: Secondary | ICD-10-CM | POA: Diagnosis not present

## 2016-12-20 DIAGNOSIS — Z87891 Personal history of nicotine dependence: Secondary | ICD-10-CM | POA: Diagnosis not present

## 2016-12-20 DIAGNOSIS — C8511 Unspecified B-cell lymphoma, lymph nodes of head, face, and neck: Secondary | ICD-10-CM | POA: Insufficient documentation

## 2016-12-20 HISTORY — PX: COLONOSCOPY WITH PROPOFOL: SHX5780

## 2016-12-20 SURGERY — COLONOSCOPY WITH PROPOFOL
Anesthesia: General

## 2016-12-20 MED ORDER — PROPOFOL 500 MG/50ML IV EMUL
INTRAVENOUS | Status: AC
  Filled 2016-12-20: qty 50

## 2016-12-20 MED ORDER — SODIUM CHLORIDE 0.9 % IV SOLN
INTRAVENOUS | Status: DC
  Administered 2016-12-20: 14:00:00 via INTRAVENOUS
  Administered 2016-12-20: 1000 mL via INTRAVENOUS

## 2016-12-20 MED ORDER — LIDOCAINE HCL (CARDIAC) 20 MG/ML IV SOLN
INTRAVENOUS | Status: DC | PRN
  Administered 2016-12-20: 40 mg via INTRAVENOUS

## 2016-12-20 MED ORDER — GLYCOPYRROLATE 0.2 MG/ML IJ SOLN
INTRAMUSCULAR | Status: AC
  Filled 2016-12-20: qty 1

## 2016-12-20 MED ORDER — PROPOFOL 10 MG/ML IV BOLUS
INTRAVENOUS | Status: DC | PRN
  Administered 2016-12-20: 50 mg via INTRAVENOUS

## 2016-12-20 MED ORDER — LIDOCAINE HCL (PF) 2 % IJ SOLN
INTRAMUSCULAR | Status: AC
  Filled 2016-12-20: qty 2

## 2016-12-20 MED ORDER — PROPOFOL 500 MG/50ML IV EMUL
INTRAVENOUS | Status: DC | PRN
  Administered 2016-12-20: 120 ug/kg/min via INTRAVENOUS

## 2016-12-20 MED ORDER — PHENYLEPHRINE HCL 10 MG/ML IJ SOLN
INTRAMUSCULAR | Status: DC | PRN
  Administered 2016-12-20: 200 ug via INTRAVENOUS
  Administered 2016-12-20 (×2): 100 ug via INTRAVENOUS

## 2016-12-20 MED ORDER — GLYCOPYRROLATE 0.2 MG/ML IJ SOLN
INTRAMUSCULAR | Status: DC | PRN
  Administered 2016-12-20: 0.1 mg via INTRAVENOUS

## 2016-12-20 NOTE — Transfer of Care (Signed)
Immediate Anesthesia Transfer of Care Note  Patient: Gary Ferrell  Procedure(s) Performed: Procedure(s): COLONOSCOPY WITH PROPOFOL (N/A)  Patient Location: Endoscopy Unit  Anesthesia Type:General  Level of Consciousness: drowsy and patient cooperative  Airway & Oxygen Therapy: Patient Spontanous Breathing and Patient connected to nasal cannula oxygen  Post-op Assessment: Report given to RN and Post -op Vital signs reviewed and stable  Post vital signs: Reviewed and stable  Last Vitals:  Vitals:   12/20/16 1325 12/20/16 1448  BP: (!) 158/74   Pulse: 81   Resp: 16   Temp: 36.4 C (!) (P) 36.2 C  SpO2: 100%     Last Pain:  Vitals:   12/20/16 1325  TempSrc: Tympanic         Complications: No apparent anesthesia complications

## 2016-12-20 NOTE — Op Note (Signed)
[facility] Gastroenterology Patient Name: Gary Ferrell Procedure Date: 12/20/2016 1:34 PM MRN: [id number] Account #: [id number] Date of Birth: [birth year] Admit Type: Outpatient Age: 81 Room: [facility] ENDO ROOM 1 Gender: Male Note Status: Finalized Procedure:            Colonoscopy Indications:          Abnormal PET scan of the GI tract Providers:            Durante Violett G. Jamal Collin, MD Medicines:            Total IV Anesthesia (TIVA) Complications:        No immediate complications. Procedure:            Pre-Anesthesia Assessment:                       - Using IV propofol under the supervision of an                        anesthesiologist was determined to be medically                        necessary for this procedure based on review of the                        patient's medical history, medications, and prior                        anesthesia history.                       After obtaining informed consent, the colonoscope was                        passed under direct vision. Throughout the procedure,                        the patient's blood pressure, pulse, and oxygen                        saturations were monitored continuously. The                        Colonoscope was introduced through the anus and                        advanced to the the cecum, identified by the ileocecal                        valve. The colonoscopy was performed without                        difficulty. The patient tolerated the procedure fairly                        well. The quality of the bowel preparation was good. Findings:      The perianal and digital rectal examinations were normal.      An infiltrative partially obstructing large mass was found in the cecum.       The mass was circumferential. No bleeding was present. This was biopsied       with a cold forceps for histology.  A few small-mouthed diverticula were found in the sigmoid colon.      The exam was  otherwise without abnormality. Impression:           - Partially obstructing tumor in the cecum. Biopsied.                       - Diverticulosis in the sigmoid colon.                       - The examination was otherwise normal. Recommendation:       - Await pathology results.                       - Return to my office in 1 week. Procedure Code(s):    --- Professional ---                       [phone], Colonoscopy, flexible; with biopsy, single or                        multiple Diagnosis Code(s):    --- Professional ---                       D49.0, Neoplasm of unspecified behavior of digestive                        system                       K57.30, Diverticulosis of large intestine without                        perforation or abscess without bleeding                       R93.3, Abnormal findings on diagnostic imaging of other                        parts of digestive tract CPT copyright 2016 [employer]. All rights reserved. The codes documented in this report are preliminary and upon coder review may  be revised to meet current compliance requirements. Christene Lye, MD 12/20/2016 2:46:52 PM This report has been signed electronically. Number of Addenda: 0 Note Initiated On: 12/20/2016 1:34 PM Scope Withdrawal Time: 0 hours 2 minutes 30 seconds  Total Procedure Duration: 0 hours 40 minutes 15 seconds       [facility]

## 2016-12-20 NOTE — H&P (Signed)
Gary Ferrell is an 81 y.o. male.   Chief Complaint: Here for colonoscopy HPI:81 yr old male with mild demnentia, was treatyed for lymphoma with good results. Recent surveillance PET scan showed hypermetabolic area in cecum with wall thickening. Colonoscopy was recommended and pt here for same. See details  in OV note from 11/16/16  Past Medical History:  Diagnosis Date  . AICD (automatic cardioverter/defibrillator) present   . BPH (benign prostatic hyperplasia)    a. 06/2012 s/p Greenlight Laser Prostatectomy.  . Carotid disease, bilateral ([place])    a. 1990's s/p L CEA;  b. 11/2004 s/p redo L CEA;  c. 06/2013 Carotid U/S: RICA 67-34%, LICA patent.  . Chronic systolic CHF (congestive heart failure) ([place])    a. 04/2013 Echo: EF 35%, inf DK, inflat AK, diast dysfxn, RV HK, mild MR, nmildly dil LA.  Marland Kitchen CINV (chemotherapy-induced nausea and vomiting)   . CKD (chronic kidney disease), stage II   . Coronary artery disease    a. 2007 s/p MI & CABG.  . Dementia   . Diverticulosis    a. 10/2013 colonoscopy (otw nl study).  Marland Kitchen Dyspnea   . GERD (gastroesophageal reflux disease)   . Hyperlipidemia   . Hypertension   . Ischemic cardiomyopathy    a. 04/2013 Echo: EF 35%;  b. 06/2013 s/p SJM Bi-V ICD, ser # [id number].  . Large B-cell lymphoma ([place]) 06/16/2016  . LBBB (left bundle branch block)   . Lung nodules   . Myocardial infarction ([place])   . OSA (obstructive sleep apnea)    a. 12/2013 Sleep study: severe OSA - CPAP-   . PAD (peripheral artery disease) ([place])    a. 1990's s/p Aortobifem bypass.  . Presence of permanent cardiac pacemaker   . Spinal stenosis   . Stricture of urethral meatus    a. 08/2009 s/p dil.  . Stroke [facility])    a. 2007 Periop CABG - no residual.    Past Surgical History:  Procedure Laterality Date  . AORTA - BILATERAL FEMORAL ARTERY BYPASS GRAFT  1992  . APPENDECTOMY  1964  . AXILLARY LYMPH NODE BIOPSY Left 06/16/2016   LARGE B-CELL LYMPHOMA/ AXILLARY LYMPH NODE BIOPSY;   Surgeon: Christene Lye, MD;  Location: [facility] ORS;  Service: General;  Laterality: Left;  . BI-VENTRICULAR IMPLANTABLE CARDIOVERTER DEFIBRILLATOR N/A 06/25/2013   Procedure: BI-VENTRICULAR IMPLANTABLE CARDIOVERTER DEFIBRILLATOR  (CRT-D);  Surgeon: Evans Lance, MD;  Location: [facility] CATH LAB;  Service: Cardiovascular;  Laterality: N/A;  . BI-VENTRICULAR IMPLANTABLE CARDIOVERTER DEFIBRILLATOR  (CRT-D)  06/25/2013  . BI-VENTRICULAR IMPLANTABLE CARDIOVERTER DEFIBRILLATOR  (CRT-D)  06/25/13   STJ CRTD implanted by Dr Lovena Le  . CARDIAC CATHETERIZATION  2007; 2012   "unsuccessful attempt to put stents in in 2007, had MI, had to have CABG;"  . CAROTID ENDARTERECTOMY Left 1992; 2006  . CHOLECYSTECTOMY  ~ 1996  . COLON SURGERY  2007   herniated intestinal obstruction  . CORONARY ARTERY BYPASS GRAFT  2007   "CABG X3" (06/25/2013)  . GREEN LIGHT LASER TURP (TRANSURETHRAL RESECTION OF PROSTATE N/A 07/02/2012   Procedure: GREEN LIGHT LASER TURP (TRANSURETHRAL RESECTION OF PROSTATE;  Surgeon: Ailene Rud, MD;  Location: [facility] ORS;  Service: Urology;  Laterality: N/A;  . PORTACATH PLACEMENT N/A 07/20/2016   Procedure: INSERTION PORT-A-CATH;  Surgeon: Christene Lye, MD;  Location: [facility] ORS;  Service: General;  Laterality: N/A;  . SKIN GRAFT  2008   "area of colon surgery wouldn't heal; had this graft over area"  .  TONSILLECTOMY    . URETHRAL MEATOPLASTY  08/2009  . URETHRAL STRICTURE DILATATION  02/2010; 09/2010  . VENTRAL HERNIA REPAIR  2008   ventral epigastric hernia repair [Other]    Family History  Problem Relation Age of Onset  . Pancreatic cancer Father   . Diabetes Brother   . Heart disease Brother   . Colon cancer Maternal Grandmother   . Parkinson's disease Mother   . Lung cancer Brother        stage 4   Social History:  reports that he quit smoking about 27 years ago. His smoking use included Cigarettes. He has a 20.00 pack-year smoking history. He has never used smokeless  tobacco. He reports that he does not drink alcohol or use drugs.  Allergies:  Allergies  Allergen Reactions  . Dapsone Other (See Comments)    REACTION: caused neuropathy/ affected walking  patient stated he received high dose of dapsone which caused the neuropathy     Medications Prior to Admission  Medication Sig Dispense Refill  . acetaminophen (TYLENOL) 500 MG tablet Take 1,000 mg by mouth every 4 (four) hours as needed for mild pain or moderate pain.    Marland Kitchen alum & mag hydroxide-simeth (MAALOX/MYLANTA) 200-200-20 MG/5ML suspension Take 30 mLs by mouth every 6 (six) hours as needed for indigestion or heartburn.    Marland Kitchen aspirin EC 81 MG tablet Take 81 mg by mouth daily.    . carvedilol (COREG) 3.125 MG tablet TAKE 1 TABLET BY MOUTH 2 (TWO) TIMES DAILY WITH MEALS. (Patient taking differently: TAKE 1/2 TABLET BY MOUTH 2 (TWO) TIMES DAILY WITH MEALS.) 180 tablet 1  . cholecalciferol (VITAMIN D) 1000 UNITS tablet Take 1,000 Units by mouth daily.    . diclofenac (VOLTAREN) 0.1 % ophthalmic solution Apply 1 drop to eye 4 (four) times daily. Both eyes    . donepezil (ARICEPT) 5 MG tablet Take 1 tablet by mouth at bedtime.    . finasteride (PROSCAR) 5 MG tablet Take 5 mg by mouth daily.    Marland Kitchen lidocaine-prilocaine (EMLA) cream Apply 1 application topically as needed. Apply to port cath site 1 hour prior to access. 30 g 3  . loperamide (IMODIUM A-D) 2 MG tablet Take 2 mg by mouth 4 (four) times daily as needed for diarrhea or loose stools.    . magic mouthwash w/lidocaine SOLN Take 5 mLs by mouth 4 (four) times daily. 240 mL 3  . magnesium hydroxide (MILK OF MAGNESIA) 400 MG/5ML suspension Take 30 mLs by mouth daily as needed for mild constipation.    . Multiple Vitamin (MULTIVITAMIN WITH MINERALS) TABS Take 1 tablet by mouth daily.    Marland Kitchen Neomycin-Bacitracin-Polymyxin (NEOPORACIN EX) Apply 1 application topically every 12 (twelve) hours as needed (abrasion).    . ondansetron (ZOFRAN) 8 MG tablet Take one  pill every 8 hours as needed for nasuea/ vomitting. 40 tablet 1  . predniSONE (DELTASONE) 50 MG tablet Pt to take 2 tablets daily for 5 days starting on the day of chemotherapy regimen.   This rx will need to be with each treatment. Take in AM with food. 10 tablet 0  . rosuvastatin (CRESTOR) 20 MG tablet Take 20 mg by mouth every morning. Reported on 06/16/2015    . tamsulosin (FLOMAX) 0.4 MG CAPS capsule Take 1 capsule by mouth daily.      No results found for this or any previous visit (from the past 48 hour(s)). No results found.  Review of Systems  Constitutional: Negative.  Respiratory: Negative.   Cardiovascular: Negative.   Gastrointestinal: Negative.     Blood pressure (!) 158/74, pulse 81, temperature 97.6 F (36.4 C), temperature source Tympanic, resp. rate 16, height 5\' 11"  (1.803 m), weight 160 lb (72.6 kg), SpO2 100 %. Physical Exam  Constitutional: He appears well-developed and well-nourished.  Eyes: Conjunctivae are normal. No scleral icterus.  Neck: Neck supple.  Cardiovascular: Normal rate, regular rhythm and normal heart sounds.   Respiratory: Effort normal and breath sounds normal.  GI: Soft. Bowel sounds are normal. He exhibits no distension and no mass.  Lymphadenopathy:    He has no cervical adenopathy.  Neurological: He is alert.  Skin: Skin is warm and dry.     Assessment/Plan Abnormal imaging of Cecum on PET scan.  Colonoscopy as scheduled  Christene Lye, MD 12/20/2016, 1:33 PM

## 2016-12-20 NOTE — Anesthesia Post-op Follow-up Note (Signed)
Anesthesia QCDR form completed.        

## 2016-12-20 NOTE — Interval H&P Note (Signed)
History and Physical Interval Note:  12/20/2016 1:38 PM  Gary Ferrell  has presented today for surgery, with the diagnosis of ABNORMAL IMAGING OF CECUM HX COLON POLYPS  The various methods of treatment have been discussed with the patient and family. After consideration of risks, benefits and other options for treatment, the patient has consented to  Procedure(s): COLONOSCOPY WITH PROPOFOL (N/A) as a surgical intervention .  The patient's history has been reviewed, patient examined, no change in status, stable for surgery.  I have reviewed the patient's chart and labs.  Questions were answered to the patient's satisfaction.     SANKAR,SEEPLAPUTHUR G

## 2016-12-20 NOTE — Anesthesia Preprocedure Evaluation (Addendum)
Anesthesia Evaluation  Patient identified by MRN, date of birth, ID band Patient awake and Patient confused    Reviewed: Allergy & Precautions, H&P , NPO status , Patient's Chart, lab work & pertinent test results, reviewed documented beta blocker date and time   History of Anesthesia Complications Negative for: history of anesthetic complications  Airway Mallampati: II  TM Distance: >3 FB Neck ROM: full    Dental no notable dental hx. (+) Teeth Intact   Pulmonary shortness of breath, sleep apnea , neg COPD, neg recent URI, former smoker,           Cardiovascular Exercise Tolerance: Good hypertension, (-) angina+ CAD, + Past MI, + CABG, + Peripheral Vascular Disease and +CHF  (-) Cardiac Stents negative cardio ROS  + dysrhythmias + pacemaker + Cardiac Defibrillator (-) Valvular Problems/Murmurs     Neuro/Psych PSYCHIATRIC DISORDERS (Dementia) CVA, Residual Symptoms negative neurological ROS     GI/Hepatic negative GI ROS, Neg liver ROS, GERD  Medicated,  Endo/Other  negative endocrine ROSdiabetes  Renal/GU Renal disease  negative genitourinary   Musculoskeletal   Abdominal   Peds  Hematology negative hematology ROS (+)   Anesthesia Other Findings Past Medical History: No date: AICD (automatic cardioverter/defibrillator) pr* No date: BPH (benign prostatic hyperplasia)     Comment: a. 06/2012 s/p Greenlight Laser Prostatectomy. No date: Carotid disease, bilateral ([place])     Comment: a. 1990's s/p L CEA;  b. 11/2004 s/p redo L               CEA;  c. 06/2013 Carotid U/S: RICA 36-14%, LICA               patent. No date: Chronic systolic CHF (congestive heart failure*     Comment: a. 04/2013 Echo: EF 35%, inf DK, inflat AK,               diast dysfxn, RV HK, mild MR, nmildly dil LA. No date: CKD (chronic kidney disease), stage II No date: Coronary artery disease     Comment: a. 2007 s/p MI & CABG. No date: Dementia No  date: Diverticulosis     Comment: a. 10/2013 colonoscopy (otw nl study). No date: Dyspnea No date: GERD (gastroesophageal reflux disease) No date: Hyperlipidemia No date: Hypertension No date: Ischemic cardiomyopathy     Comment: a. 04/2013 Echo: EF 35%;  b. 06/2013 s/p SJM               Bi-V ICD, ser # [id number]. 06/16/2016: Large B-cell lymphoma ([place]) No date: LBBB (left bundle branch block) No date: Lung nodules No date: Myocardial infarction No date: OSA (obstructive sleep apnea)     Comment: a. 12/2013 Sleep study: severe OSA - CPAP-  No date: PAD (peripheral artery disease) ([place])     Comment: a. 1990's s/p Aortobifem bypass. No date: Presence of permanent cardiac pacemaker No date: Spinal stenosis No date: Stricture of urethral meatus     Comment: a. 08/2009 s/p dil. No date: Stroke [facility])     Comment: a. 2007 Periop CABG - no residual.   Reproductive/Obstetrics negative OB ROS                            Anesthesia Physical  Anesthesia Plan  ASA: IV  Anesthesia Plan: General   Post-op Pain Management:    Induction: Intravenous  PONV Risk Score and Plan: Propofol infusion  Airway Management Planned: Natural Airway and Nasal Cannula  Additional Equipment:   Intra-op Plan:   Post-operative Plan:   Informed Consent: I have reviewed the patients History and Physical, chart, labs and discussed the procedure including the risks, benefits and alternatives for the proposed anesthesia with the patient or authorized representative who has indicated his/her understanding and acceptance.   Dental Advisory Given  Plan Discussed with: CRNA  Anesthesia Plan Comments: (Patients son, Jymir Dunaj consented by phone at [phone]  Patient consented for risks of anesthesia including but not limited to:  - adverse reactions to medications - risk of intubation if required - damage to teeth, lips or other oral mucosa - sore throat or hoarseness - Damage  to heart, brain, lungs or loss of life  Patient voiced understanding.)        Anesthesia Quick Evaluation

## 2016-12-21 ENCOUNTER — Telehealth: Payer: Self-pay | Admitting: General Surgery

## 2016-12-21 ENCOUNTER — Encounter: Payer: Self-pay | Admitting: General Surgery

## 2016-12-21 NOTE — Telephone Encounter (Signed)
I'VE CALLED & LEFT A MESSAGE FOR [name](SON /POA) FOR HIM TO CALL & SCHEDULE AN APPOINTMENT WITH DR Jamal Collin FOR HIS FATHER NEXT WEEK F/U COLONOSCOPY DONE 12-20-16.PATIENT IS IN A CARE FACILITY([facility]([phone]).DO WE NEED TO CONTACT THE FACILITY & MAKE THEM AWARE OF APPOINTMENT OR WITH THE SON?

## 2016-12-21 NOTE — Telephone Encounter (Signed)
THE SON WILL NEED TO COME TO APPOINTMENT!!!!

## 2016-12-21 NOTE — Telephone Encounter (Signed)
Gary Ferrell([phone]) CALLED FROM FACILITY TO SCHEDULE 1 WK F/U FROM COLONOSCOPY.EXPLAINED SON NEEDED TO BE AT APPOINTMENT SINCE HE IS THE POA.I HAVE A CALL HIM AND LEFT A MESSAGE FOR HIM TO CALL & SCHEDULE APPOINTMENT.ONCE HE SCHEDULES APPOINTMENT WE NEEDED TO CALL Gary & LET HER KNOW DATE & TIME!!!!

## 2016-12-22 LAB — SURGICAL PATHOLOGY

## 2016-12-22 NOTE — Anesthesia Postprocedure Evaluation (Signed)
Anesthesia Post Note  Patient: Gary Ferrell  Procedure(s) Performed: Procedure(s) (LRB): COLONOSCOPY WITH PROPOFOL (N/A)  Patient location during evaluation: Endoscopy Anesthesia Type: General Level of consciousness: awake and alert Pain management: pain level controlled Vital Signs Assessment: post-procedure vital signs reviewed and stable Respiratory status: spontaneous breathing, nonlabored ventilation, respiratory function stable and patient connected to nasal cannula oxygen Cardiovascular status: blood pressure returned to baseline and stable Postop Assessment: no apparent nausea or vomiting Anesthetic complications: no     Last Vitals:  Vitals:   12/20/16 1508 12/20/16 1518  BP: (!) 103/48 107/70  Pulse: 73 70  Resp: (!) 25 14  Temp:    SpO2: 98% 100%    Last Pain:  Vitals:   12/20/16 1448  TempSrc: Tympanic  PainSc: Asleep                 Martha Clan

## 2016-12-29 ENCOUNTER — Encounter: Payer: Self-pay | Admitting: General Surgery

## 2016-12-29 ENCOUNTER — Ambulatory Visit (INDEPENDENT_AMBULATORY_CARE_PROVIDER_SITE_OTHER): Payer: Medicare Other | Admitting: General Surgery

## 2016-12-29 VITALS — BP 130/78 | HR 68 | Resp 14 | Ht 71.0 in | Wt 161.0 lb

## 2016-12-29 DIAGNOSIS — Z86010 Personal history of colonic polyps: Secondary | ICD-10-CM

## 2016-12-29 DIAGNOSIS — C180 Malignant neoplasm of cecum: Secondary | ICD-10-CM

## 2016-12-29 MED ORDER — POLYETHYLENE GLYCOL 3350 17 GM/SCOOP PO POWD: ORAL | 0 refills | Status: DC

## 2016-12-29 MED ORDER — NEOMYCIN SULFATE 500 MG PO TABS: ORAL_TABLET | ORAL | 0 refills | Status: DC

## 2016-12-29 MED ORDER — METRONIDAZOLE 500 MG PO TABS: ORAL_TABLET | ORAL | 0 refills | Status: AC

## 2016-12-29 NOTE — Patient Instructions (Addendum)
Will schedule for a left colon resection for cecal tumor excision.

## 2016-12-29 NOTE — Progress Notes (Signed)
Patient ID: Gary Ferrell, male   DOB: 11-08-1935, 81 y.o.   MRN: [id number]  Chief Complaint  Patient presents with  . Colonoscopy    HPI Gary Ferrell is a 81 y.o. male here today for his follow up colonoscopy done on 12/26/2016. Son present at visit.  HPI  Past Medical History:  Diagnosis Date  . AICD (automatic cardioverter/defibrillator) present   . BPH (benign prostatic hyperplasia)    a. 06/2012 s/p Greenlight Laser Prostatectomy.  . Carotid disease, bilateral ([place])    a. 1990's s/p L CEA;  b. 11/2004 s/p redo L CEA;  c. 06/2013 Carotid U/S: RICA 39-76%, LICA patent.  . Chronic systolic CHF (congestive heart failure) ([place])    a. 04/2013 Echo: EF 35%, inf DK, inflat AK, diast dysfxn, RV HK, mild MR, nmildly dil LA.  Marland Kitchen CINV (chemotherapy-induced nausea and vomiting)   . CKD (chronic kidney disease), stage II   . Coronary artery disease    a. 2007 s/p MI & CABG.  . Dementia   . Diverticulosis    a. 10/2013 colonoscopy (otw nl study).  Marland Kitchen Dyspnea   . GERD (gastroesophageal reflux disease)   . Hyperlipidemia   . Hypertension   . Ischemic cardiomyopathy    a. 04/2013 Echo: EF 35%;  b. 06/2013 s/p SJM Bi-V ICD, ser # [id number].  . Large B-cell lymphoma ([place]) 06/16/2016  . LBBB (left bundle branch block)   . Lung nodules   . Myocardial infarction ([place])   . OSA (obstructive sleep apnea)    a. 12/2013 Sleep study: severe OSA - CPAP-   . PAD (peripheral artery disease) ([place])    a. 1990's s/p Aortobifem bypass.  . Presence of permanent cardiac pacemaker   . Spinal stenosis   . Stricture of urethral meatus    a. 08/2009 s/p dil.  . Stroke [facility])    a. 2007 Periop CABG - no residual.    Past Surgical History:  Procedure Laterality Date  . AORTA - BILATERAL FEMORAL ARTERY BYPASS GRAFT  1992  . APPENDECTOMY  1964  . AXILLARY LYMPH NODE BIOPSY Left 06/16/2016   LARGE B-CELL LYMPHOMA/ AXILLARY LYMPH NODE BIOPSY;  Surgeon: Christene Lye, MD;  Location: [facility] ORS;  Service:  General;  Laterality: Left;  . BI-VENTRICULAR IMPLANTABLE CARDIOVERTER DEFIBRILLATOR N/A 06/25/2013   Procedure: BI-VENTRICULAR IMPLANTABLE CARDIOVERTER DEFIBRILLATOR  (CRT-D);  Surgeon: Evans Lance, MD;  Location: [facility] CATH LAB;  Service: Cardiovascular;  Laterality: N/A;  . BI-VENTRICULAR IMPLANTABLE CARDIOVERTER DEFIBRILLATOR  (CRT-D)  06/25/2013  . BI-VENTRICULAR IMPLANTABLE CARDIOVERTER DEFIBRILLATOR  (CRT-D)  06/25/13   STJ CRTD implanted by Dr Lovena Le  . CARDIAC CATHETERIZATION  2007; 2012   "unsuccessful attempt to put stents in in 2007, had MI, had to have CABG;"  . CAROTID ENDARTERECTOMY Left 1992; 2006  . CHOLECYSTECTOMY  ~ 1996  . COLON SURGERY  2007   herniated intestinal obstruction  . COLONOSCOPY WITH PROPOFOL N/A 12/20/2016   Procedure: COLONOSCOPY WITH PROPOFOL;  Surgeon: Christene Lye, MD;  Location: [facility] ENDOSCOPY;  Service: Endoscopy;  Laterality: N/A;  . CORONARY ARTERY BYPASS GRAFT  2007   "CABG X3" (06/25/2013)  . GREEN LIGHT LASER TURP (TRANSURETHRAL RESECTION OF PROSTATE N/A 07/02/2012   Procedure: GREEN LIGHT LASER TURP (TRANSURETHRAL RESECTION OF PROSTATE;  Surgeon: Ailene Rud, MD;  Location: [facility] ORS;  Service: Urology;  Laterality: N/A;  . PORTACATH PLACEMENT N/A 07/20/2016   Procedure: INSERTION PORT-A-CATH;  Surgeon: Christene Lye, MD;  Location: [facility]  ORS;  Service: General;  Laterality: N/A;  . SKIN GRAFT  2008   "area of colon surgery wouldn't heal; had this graft over area"  . TONSILLECTOMY    . URETHRAL MEATOPLASTY  08/2009  . URETHRAL STRICTURE DILATATION  02/2010; 09/2010  . VENTRAL HERNIA REPAIR  2008   ventral epigastric hernia repair [Other]    Family History  Problem Relation Age of Onset  . Pancreatic cancer Father   . Diabetes Brother   . Heart disease Brother   . Colon cancer Maternal Grandmother   . Parkinson's disease Mother   . Lung cancer Brother        stage 4    Social History Social History  Substance Use  Topics  . Smoking status: Former Smoker    Packs/day: 1.00    Years: 20.00    Types: Cigarettes    Quit date: 04/11/1989  . Smokeless tobacco: Never Used  . Alcohol use No    Allergies  Allergen Reactions  . Dapsone Other (See Comments)    REACTION: caused neuropathy/ affected walking  patient stated he received high dose of dapsone which caused the neuropathy     Current Outpatient Prescriptions  Medication Sig Dispense Refill  . acetaminophen (TYLENOL) 500 MG tablet Take 1,000 mg by mouth every 4 (four) hours as needed for mild pain or moderate pain.    Marland Kitchen alum & mag hydroxide-simeth (MAALOX/MYLANTA) 200-200-20 MG/5ML suspension Take 30 mLs by mouth every 6 (six) hours as needed for indigestion or heartburn.    . carvedilol (COREG) 3.125 MG tablet TAKE 1 TABLET BY MOUTH 2 (TWO) TIMES DAILY WITH MEALS. (Patient taking differently: TAKE 0.5 TABLET (1.5625 MG) BY MOUTH TWICE DAILY (0600 & 1700)) 180 tablet 1  . cholecalciferol (VITAMIN D) 1000 UNITS tablet Take 1,000 Units by mouth daily. (0600)    . donepezil (ARICEPT) 5 MG tablet Take 5 mg by mouth at bedtime. (0600)    . finasteride (PROSCAR) 5 MG tablet Take 5 mg by mouth daily. (0800)    . lidocaine-prilocaine (EMLA) cream Apply 1 application topically as needed. Apply to port cath site 1 hour prior to access. (Patient not taking: Reported on 12/30/2016) 30 g 3  . loperamide (IMODIUM A-D) 2 MG tablet Take 2 mg by mouth 4 (four) times daily as needed for diarrhea or loose stools.    . magic mouthwash w/lidocaine SOLN Take 5 mLs by mouth 4 (four) times daily. (Patient taking differently: Take 5 mLs by mouth 4 (four) times daily. (0800, 1200, 1600, 1800)) 240 mL 3  . magnesium hydroxide (MILK OF MAGNESIA) 400 MG/5ML suspension Take 30 mLs by mouth daily as needed for mild constipation.    . Multiple Vitamin (MULTIVITAMIN WITH MINERALS) TABS Take 1 tablet by mouth daily. (0600)    . Neomycin-Bacitracin-Polymyxin (NEOPORACIN EX) Apply 1  application topically every 12 (twelve) hours as needed (abrasion).    . ondansetron (ZOFRAN) 8 MG tablet Take one pill every 8 hours as needed for nasuea/ vomitting. (Patient not taking: Reported on 12/30/2016) 40 tablet 1  . predniSONE (DELTASONE) 50 MG tablet Pt to take 2 tablets daily for 5 days starting on the day of chemotherapy regimen.   This rx will need to be with each treatment. Take in AM with food. 10 tablet 0  . rosuvastatin (CRESTOR) 20 MG tablet Take 20 mg by mouth daily. (0800) Reported on 06/16/2015    . tamsulosin (FLOMAX) 0.4 MG CAPS capsule Take 0.4 mg by mouth daily. (  0800)    . aspirin 81 MG chewable tablet Chew 81 mg by mouth daily. (0800)    . aspirin EC 325 MG tablet Take 325 mg by mouth daily as needed (for chest pain as directed).    Marland Kitchen guaiFENesin (SILTUSSIN DAS) 100 MG/5ML liquid Take 200 mg by mouth 4 (four) times daily as needed for cough.    . Lidocaine (ALOE BURN RELIEF) 0.5 % AERO Apply 1 application topically every 6 (six) hours as needed (for itch/rash.).    Derrill Memo ON 01/08/2017] metroNIDAZOLE (FLAGYL) 500 MG tablet Take one (1) tablet at 6 pm and one (1) tablet at 11 pm the evening prior to surgery. 2 tablet 0  . neomycin (MYCIFRADIN) 500 MG tablet Take two (2) tablets at 6 pm and two (2) tablets at 11 pm the evening prior to surgery. 4 tablet 0  . polyethylene glycol powder (GLYCOLAX/MIRALAX) powder 255 grams one bottle for bowel prep 255 g 0   No current facility-administered medications for this visit.     Review of Systems Review of Systems  Constitutional: Negative.   Respiratory: Negative.   Cardiovascular: Negative.     Blood pressure 130/78, pulse 68, resp. rate 14, height 5\' 11"  (1.803 m), weight 161 lb (73 kg).  Physical Exam Physical Exam  Constitutional: He is oriented to person, place, and time. He appears well-developed and well-nourished.  Eyes: Conjunctivae are normal. No scleral icterus.  Neck: Neck supple.  Cardiovascular: Normal  rate, regular rhythm and normal heart sounds.   Pulmonary/Chest: Effort normal and breath sounds normal. No respiratory distress.  Abdominal: Soft. Bowel sounds are normal. There is no tenderness.    Lymphadenopathy:    He has no cervical adenopathy.  Neurological: He is alert and oriented to person, place, and time.  Skin: Skin is warm and dry.  Psychiatric: Thought content normal.    Data Reviewed Recent colonoscopy, pathology, PET scan, and notes reviewed Colonoscopy showed a firm hard circumferential mass. Biopsy confirmed adenocarcinoma Assessment    Ca cecum - recent colonoscopy results and pathology of cecum mass was discussed with the patient including the recommendation for a rightcolectomy. PET scan shows there is no current metastasis. Patient expressed agreement to proceed with surgery. Surgery is best accomplished with right transverse incision to avoid midline thinned out fascia. 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  I agree with the above.  Haematologist has been used and any errors in dictation or transcription are unintentional.  Seeplaputhur G. Jamal Collin, M.D., F.A.C.S.   Junie Panning G 12/30/2016, 4:08 PM  Patient's surgery has been scheduled for 01-09-17 at [facility]. The patient will be asked to complete a bowel prep with antibiotics. It is okay for patient to continue an 81 mg aspirin once daily.    Dominga Ferry, CMA

## 2016-12-30 ENCOUNTER — Telehealth: Payer: Self-pay

## 2016-12-30 LAB — CEA: CEA: 2.3 ng/mL (ref 0.0–4.7)

## 2016-12-30 NOTE — Telephone Encounter (Signed)
Message left for Carmel Waddington (patient's son) to call back for pre admit date and time.  Pre admit is Monday 01/02/17 at 2:30 pm.  I spoke with the nurse, Roseanne, at his group home and have informed her of this appointment.

## 2016-12-30 NOTE — Telephone Encounter (Signed)
Spoke with son, [name] and notified him of date and time.

## 2017-01-02 ENCOUNTER — Encounter
Admission: RE | Admit: 2017-01-02 | Discharge: 2017-01-02 | Disposition: A | Discharge status: Home / Self Care | Payer: Medicare Other | Source: Ambulatory Visit | Attending: General Surgery | Admitting: General Surgery

## 2017-01-02 DIAGNOSIS — Z01810 Encounter for preprocedural cardiovascular examination: Secondary | ICD-10-CM | POA: Diagnosis present

## 2017-01-02 DIAGNOSIS — Z950 Presence of cardiac pacemaker: Secondary | ICD-10-CM | POA: Diagnosis not present

## 2017-01-02 DIAGNOSIS — Z01812 Encounter for preprocedural laboratory examination: Secondary | ICD-10-CM | POA: Diagnosis not present

## 2017-01-02 LAB — SURGICAL PCR SCREEN
MRSA, PCR: NEGATIVE
Staphylococcus aureus: NEGATIVE

## 2017-01-02 NOTE — Patient Instructions (Addendum)
Your procedure is scheduled on: January 09, 2017 Report to Same Day Surgery on the 2nd floor in the [facility]. To find out your arrival time, please call [phone] between 1PM - 3PM on: January 06, 2017  REMEMBER: Instructions that are not followed completely may result in serious medical risk up to and including death; or upon the discretion of your surgeon and anesthesiologist your surgery may need to be rescheduled.  Do not eat food or drink liquids after midnight on Sunday.  No gum chewing or hard candy.  .NO ALCOHOL for 24 hours before or after surgery.  No Smoking for 24 hours prior to surgery.  Notify your doctor if there is any change in your medical condition (cold, fever, infection).  Do not wear jewelry, make-up, hairpins, clips or nail polish.  Do not wear lotions, powders, or perfumes.   Do not shave 48 hours prior to surgery. Men may shave face and neck.  Contacts and dentures may not be worn into surgery.  Do not bring valuables to the hospital. [facility] is not responsible for any belongings or valuables.   TAKE THESE MEDICATIONS THE MORNING OF SURGERY WITH A SIP OF WATER: Carvedilol aricept proscar flomax Rosuvastatin if taken as an am med     Use CHG Soap or wipes as directed on instruction sheet.  Bowel prep per MD instruction.   Bring your C-PAP to the hospital with you in case you may have to spend the night.   Follow recommendations from Cardiologist, Pulmonologist or PCP regarding stopping Aspirin, Coumadin, Plavix, Eliquis, Pradaxa, or Pletal.  Stop Anti-inflammatories such as Advil, Aleve, Ibuprofen, Motrin, Naproxen, Naprosyn, Goodie powder, or aspirin products. (May take Tylenol or Acetaminophen and Celebrex if needed.)  Stop supplements until after surgery. (May continue Vitamin D, Vitamin B, and multivitamin.)  If you are being admitted to the hospital overnight, leave your suitcase in the car. After surgery it may be brought to  your room.  If you are being discharged the day of surgery, you will not be allowed to drive home. You will need someone to drive you home and stay with you that night.   If you are taking public transportation, you will need to have a responsible adult to with you.  Please call the number above if you have any questions about these instructions.

## 2017-01-04 NOTE — Pre-Procedure Instructions (Signed)
EKG COMPARED WITH 4/18 AND LBBB PRESENT

## 2017-01-06 ENCOUNTER — Inpatient Hospital Stay: Payer: Medicare Other

## 2017-01-06 ENCOUNTER — Inpatient Hospital Stay: Payer: Medicare Other | Admitting: Internal Medicine

## 2017-01-08 MED ORDER — SODIUM CHLORIDE 0.9 % IV SOLN
1.0000 g | INTRAVENOUS | Status: AC
  Administered 2017-01-09: 1 g via INTRAVENOUS
  Filled 2017-01-08: qty 1

## 2017-01-09 ENCOUNTER — Inpatient Hospital Stay: Payer: Medicare Other | Admitting: Anesthesiology

## 2017-01-09 ENCOUNTER — Inpatient Hospital Stay
Admission: RE | Admit: 2017-01-09 | Discharge: 2017-01-12 | DRG: 330 | Disposition: A | Discharge status: Skilled Nursing Facility | Payer: Medicare Other | Source: Ambulatory Visit | Attending: General Surgery | Admitting: General Surgery

## 2017-01-09 ENCOUNTER — Encounter: Payer: Self-pay | Admitting: Emergency Medicine

## 2017-01-09 ENCOUNTER — Encounter
Admission: RE | Disposition: A | Discharge status: Skilled Nursing Facility | Payer: Self-pay | Source: Ambulatory Visit | Attending: General Surgery

## 2017-01-09 DIAGNOSIS — G4733 Obstructive sleep apnea (adult) (pediatric): Secondary | ICD-10-CM | POA: Diagnosis present

## 2017-01-09 DIAGNOSIS — I2510 Atherosclerotic heart disease of native coronary artery without angina pectoris: Secondary | ICD-10-CM | POA: Diagnosis present

## 2017-01-09 DIAGNOSIS — I252 Old myocardial infarction: Secondary | ICD-10-CM | POA: Diagnosis not present

## 2017-01-09 DIAGNOSIS — Z7952 Long term (current) use of systemic steroids: Secondary | ICD-10-CM | POA: Diagnosis not present

## 2017-01-09 DIAGNOSIS — Z951 Presence of aortocoronary bypass graft: Secondary | ICD-10-CM | POA: Diagnosis not present

## 2017-01-09 DIAGNOSIS — Z95810 Presence of automatic (implantable) cardiac defibrillator: Secondary | ICD-10-CM | POA: Diagnosis not present

## 2017-01-09 DIAGNOSIS — I130 Hypertensive heart and chronic kidney disease with heart failure and stage 1 through stage 4 chronic kidney disease, or unspecified chronic kidney disease: Secondary | ICD-10-CM | POA: Diagnosis present

## 2017-01-09 DIAGNOSIS — Z888 Allergy status to other drugs, medicaments and biological substances status: Secondary | ICD-10-CM

## 2017-01-09 DIAGNOSIS — I739 Peripheral vascular disease, unspecified: Secondary | ICD-10-CM | POA: Diagnosis present

## 2017-01-09 DIAGNOSIS — Z87891 Personal history of nicotine dependence: Secondary | ICD-10-CM | POA: Diagnosis not present

## 2017-01-09 DIAGNOSIS — I5022 Chronic systolic (congestive) heart failure: Secondary | ICD-10-CM | POA: Diagnosis present

## 2017-01-09 DIAGNOSIS — C180 Malignant neoplasm of cecum: Secondary | ICD-10-CM | POA: Diagnosis present

## 2017-01-09 DIAGNOSIS — K219 Gastro-esophageal reflux disease without esophagitis: Secondary | ICD-10-CM | POA: Diagnosis present

## 2017-01-09 DIAGNOSIS — Z79899 Other long term (current) drug therapy: Secondary | ICD-10-CM

## 2017-01-09 DIAGNOSIS — E785 Hyperlipidemia, unspecified: Secondary | ICD-10-CM | POA: Diagnosis present

## 2017-01-09 DIAGNOSIS — R739 Hyperglycemia, unspecified: Secondary | ICD-10-CM | POA: Diagnosis present

## 2017-01-09 DIAGNOSIS — N400 Enlarged prostate without lower urinary tract symptoms: Secondary | ICD-10-CM | POA: Diagnosis present

## 2017-01-09 DIAGNOSIS — F0390 Unspecified dementia without behavioral disturbance: Secondary | ICD-10-CM | POA: Diagnosis present

## 2017-01-09 DIAGNOSIS — I447 Left bundle-branch block, unspecified: Secondary | ICD-10-CM | POA: Diagnosis present

## 2017-01-09 DIAGNOSIS — Z8572 Personal history of non-Hodgkin lymphomas: Secondary | ICD-10-CM

## 2017-01-09 DIAGNOSIS — N182 Chronic kidney disease, stage 2 (mild): Secondary | ICD-10-CM | POA: Diagnosis present

## 2017-01-09 HISTORY — PX: PARTIAL COLECTOMY: SHX5273

## 2017-01-09 LAB — CBC
HEMATOCRIT: 38.1 % — AB (ref 40.0–52.0)
Hemoglobin: 12.6 g/dL — ABNORMAL LOW (ref 13.0–18.0)
MCH: 26.7 pg (ref 26.0–34.0)
MCHC: 33 g/dL (ref 32.0–36.0)
MCV: 81 fL (ref 80.0–100.0)
PLATELETS: 143 10*3/uL — AB (ref 150–440)
RBC: 4.71 MIL/uL (ref 4.40–5.90)
RDW: 14.4 % (ref 11.5–14.5)
WBC: 8.9 10*3/uL (ref 3.8–10.6)

## 2017-01-09 LAB — CREATININE, SERUM
CREATININE: 0.87 mg/dL (ref 0.61–1.24)
GFR calc Af Amer: >60 mL/min (ref >60)

## 2017-01-09 SURGERY — COLECTOMY, PARTIAL
Anesthesia: General | Laterality: Right | Wound class: Clean Contaminated

## 2017-01-09 MED ORDER — PANTOPRAZOLE SODIUM 40 MG IV SOLR
40.0000 mg | Freq: Every day | INTRAVENOUS | Status: DC
  Administered 2017-01-09 – 2017-01-11 (×3): 40 mg via INTRAVENOUS
  Filled 2017-01-09 (×3): qty 40

## 2017-01-09 MED ORDER — ROCURONIUM BROMIDE 50 MG/5ML IV SOLN
INTRAVENOUS | Status: AC
  Filled 2017-01-09: qty 1

## 2017-01-09 MED ORDER — FENTANYL CITRATE (PF) 100 MCG/2ML IJ SOLN: 25.0000 ug | INTRAMUSCULAR | Status: DC | PRN

## 2017-01-09 MED ORDER — PROPOFOL 10 MG/ML IV BOLUS
INTRAVENOUS | Status: DC | PRN
  Administered 2017-01-09: 120 mg via INTRAVENOUS

## 2017-01-09 MED ORDER — DEXAMETHASONE SODIUM PHOSPHATE 10 MG/ML IJ SOLN
INTRAMUSCULAR | Status: DC | PRN
Start: 2017-01-09 — End: 2017-01-09
  Administered 2017-01-09: 5 mg via INTRAVENOUS

## 2017-01-09 MED ORDER — ENOXAPARIN SODIUM 40 MG/0.4ML ~~LOC~~ SOLN
40.0000 mg | Freq: Once | SUBCUTANEOUS | Status: DC
  Filled 2017-01-09: qty 0.4

## 2017-01-09 MED ORDER — DEXAMETHASONE SODIUM PHOSPHATE 10 MG/ML IJ SOLN
INTRAMUSCULAR | Status: AC
  Filled 2017-01-09: qty 1

## 2017-01-09 MED ORDER — ENOXAPARIN SODIUM 40 MG/0.4ML ~~LOC~~ SOLN
40.0000 mg | SUBCUTANEOUS | Status: DC
  Administered 2017-01-09 – 2017-01-11 (×3): 40 mg via SUBCUTANEOUS
  Filled 2017-01-09 (×3): qty 0.4

## 2017-01-09 MED ORDER — ONDANSETRON 4 MG PO TBDP: 4.0000 mg | ORAL_TABLET | Freq: Four times a day (QID) | ORAL | Status: DC | PRN

## 2017-01-09 MED ORDER — CHLORHEXIDINE GLUCONATE CLOTH 2 % EX PADS: 6.0000 | MEDICATED_PAD | Freq: Once | CUTANEOUS | Status: DC

## 2017-01-09 MED ORDER — HYDROMORPHONE HCL 1 MG/ML IJ SOLN
INTRAMUSCULAR | Status: AC
  Filled 2017-01-09: qty 1

## 2017-01-09 MED ORDER — PHENYLEPHRINE HCL 10 MG/ML IJ SOLN
INTRAMUSCULAR | Status: AC
  Filled 2017-01-09: qty 1

## 2017-01-09 MED ORDER — INFLUENZA VAC SPLIT HIGH-DOSE 0.5 ML IM SUSY
0.5000 mL | PREFILLED_SYRINGE | INTRAMUSCULAR | Status: DC
  Filled 2017-01-09: qty 0.5

## 2017-01-09 MED ORDER — ONDANSETRON HCL 4 MG/2ML IJ SOLN
INTRAMUSCULAR | Status: DC | PRN
  Administered 2017-01-09: 4 mg via INTRAVENOUS

## 2017-01-09 MED ORDER — ALUM & MAG HYDROXIDE-SIMETH 200-200-20 MG/5ML PO SUSP
30.0000 mL | Freq: Four times a day (QID) | ORAL | Status: DC | PRN
Start: 2017-01-09 — End: 2017-01-12

## 2017-01-09 MED ORDER — ONDANSETRON HCL 4 MG/2ML IJ SOLN
INTRAMUSCULAR | Status: AC
Start: 2017-01-09 — End: 2017-01-09
  Filled 2017-01-09: qty 2

## 2017-01-09 MED ORDER — BUPIVACAINE-EPINEPHRINE (PF) 0.5% -1:200000 IJ SOLN
INTRAMUSCULAR | Status: AC
  Filled 2017-01-09: qty 30

## 2017-01-09 MED ORDER — ALVIMOPAN 12 MG PO CAPS
ORAL_CAPSULE | ORAL | Status: AC
  Administered 2017-01-09: 12 mg via ORAL
  Filled 2017-01-09: qty 1

## 2017-01-09 MED ORDER — MORPHINE SULFATE (PF) 4 MG/ML IV SOLN: 2.0000 mg | INTRAVENOUS | Status: DC | PRN

## 2017-01-09 MED ORDER — ASPIRIN 81 MG PO CHEW
81.0000 mg | CHEWABLE_TABLET | Freq: Every day | ORAL | Status: DC
  Administered 2017-01-09 – 2017-01-12 (×4): 81 mg via ORAL
  Filled 2017-01-09 (×4): qty 1

## 2017-01-09 MED ORDER — ALVIMOPAN 12 MG PO CAPS
12.0000 mg | ORAL_CAPSULE | ORAL | Status: AC
Start: 2017-01-09 — End: 2017-01-09
  Administered 2017-01-09: 12 mg via ORAL

## 2017-01-09 MED ORDER — FAMOTIDINE 20 MG PO TABS
ORAL_TABLET | ORAL | Status: AC
  Administered 2017-01-09: 20 mg via ORAL
  Filled 2017-01-09: qty 1

## 2017-01-09 MED ORDER — TAMSULOSIN HCL 0.4 MG PO CAPS
0.4000 mg | ORAL_CAPSULE | Freq: Every day | ORAL | Status: DC
  Administered 2017-01-09 – 2017-01-12 (×4): 0.4 mg via ORAL
  Filled 2017-01-09 (×4): qty 1

## 2017-01-09 MED ORDER — CARVEDILOL 3.125 MG PO TABS
3.1250 mg | ORAL_TABLET | Freq: Two times a day (BID) | ORAL | Status: DC
  Administered 2017-01-09 – 2017-01-12 (×6): 3.125 mg via ORAL
  Filled 2017-01-09 (×7): qty 1

## 2017-01-09 MED ORDER — ONDANSETRON HCL 4 MG/2ML IJ SOLN: 4.0000 mg | Freq: Four times a day (QID) | INTRAMUSCULAR | Status: DC | PRN

## 2017-01-09 MED ORDER — ACETAMINOPHEN 10 MG/ML IV SOLN
INTRAVENOUS | Status: AC
  Filled 2017-01-09: qty 100

## 2017-01-09 MED ORDER — LIDOCAINE HCL (PF) 2 % IJ SOLN
INTRAMUSCULAR | Status: AC
  Filled 2017-01-09: qty 2

## 2017-01-09 MED ORDER — PHENOL 1.4 % MT LIQD
1.0000 | OROMUCOSAL | Status: DC | PRN
  Filled 2017-01-09: qty 177

## 2017-01-09 MED ORDER — PROPOFOL 10 MG/ML IV BOLUS
INTRAVENOUS | Status: AC
Start: 2017-01-09 — End: 2017-01-09
  Filled 2017-01-09: qty 20

## 2017-01-09 MED ORDER — ACETAMINOPHEN 10 MG/ML IV SOLN
INTRAVENOUS | Status: DC | PRN
  Administered 2017-01-09: 1000 mg via INTRAVENOUS

## 2017-01-09 MED ORDER — FENTANYL CITRATE (PF) 100 MCG/2ML IJ SOLN
INTRAMUSCULAR | Status: AC
  Filled 2017-01-09: qty 2

## 2017-01-09 MED ORDER — LACTATED RINGERS IV SOLN
INTRAVENOUS | Status: DC
  Administered 2017-01-09: 07:00:00 via INTRAVENOUS

## 2017-01-09 MED ORDER — LIDOCAINE HCL (CARDIAC) 20 MG/ML IV SOLN
INTRAVENOUS | Status: DC | PRN
  Administered 2017-01-09: 80 mg via INTRAVENOUS

## 2017-01-09 MED ORDER — GUAIFENESIN 100 MG/5ML PO SOLN
200.0000 mg | Freq: Four times a day (QID) | ORAL | Status: DC | PRN
  Filled 2017-01-09: qty 10

## 2017-01-09 MED ORDER — EPHEDRINE SULFATE 50 MG/ML IJ SOLN
INTRAMUSCULAR | Status: AC
  Filled 2017-01-09: qty 1

## 2017-01-09 MED ORDER — FAMOTIDINE 20 MG PO TABS
20.0000 mg | ORAL_TABLET | Freq: Once | ORAL | Status: AC
  Administered 2017-01-09: 20 mg via ORAL

## 2017-01-09 MED ORDER — HYDROMORPHONE HCL 1 MG/ML IJ SOLN
INTRAMUSCULAR | Status: DC | PRN
  Administered 2017-01-09 (×2): 0.5 mg via INTRAVENOUS

## 2017-01-09 MED ORDER — SEVOFLURANE IN SOLN
RESPIRATORY_TRACT | Status: AC
Start: 2017-01-09 — End: 2017-01-09
  Filled 2017-01-09: qty 250

## 2017-01-09 MED ORDER — ROCURONIUM BROMIDE 100 MG/10ML IV SOLN
INTRAVENOUS | Status: DC | PRN
  Administered 2017-01-09 (×2): 20 mg via INTRAVENOUS
  Administered 2017-01-09: 10 mg via INTRAVENOUS

## 2017-01-09 MED ORDER — DONEPEZIL HCL 5 MG PO TABS
5.0000 mg | ORAL_TABLET | Freq: Every day | ORAL | Status: DC
  Administered 2017-01-09 – 2017-01-11 (×3): 5 mg via ORAL
  Filled 2017-01-09 (×4): qty 1

## 2017-01-09 MED ORDER — FENTANYL CITRATE (PF) 100 MCG/2ML IJ SOLN
INTRAMUSCULAR | Status: DC | PRN
  Administered 2017-01-09: 100 ug via INTRAVENOUS

## 2017-01-09 MED ORDER — ONDANSETRON HCL 4 MG/2ML IJ SOLN: 4.0000 mg | Freq: Once | INTRAMUSCULAR | Status: DC | PRN

## 2017-01-09 MED ORDER — SUGAMMADEX SODIUM 200 MG/2ML IV SOLN
INTRAVENOUS | Status: AC
  Filled 2017-01-09: qty 2

## 2017-01-09 MED ORDER — SUCCINYLCHOLINE CHLORIDE 20 MG/ML IJ SOLN
INTRAMUSCULAR | Status: DC | PRN
  Administered 2017-01-09: 80 mg via INTRAVENOUS

## 2017-01-09 MED ORDER — PHENYLEPHRINE HCL 10 MG/ML IJ SOLN
INTRAMUSCULAR | Status: DC | PRN
  Administered 2017-01-09: 50 ug via INTRAVENOUS
  Administered 2017-01-09 (×2): 100 ug via INTRAVENOUS

## 2017-01-09 MED ORDER — BUPIVACAINE-EPINEPHRINE (PF) 0.5% -1:200000 IJ SOLN
INTRAMUSCULAR | Status: DC | PRN
  Administered 2017-01-09: 30 mL via PERINEURAL

## 2017-01-09 MED ORDER — MENTHOL 3 MG MT LOZG
1.0000 | LOZENGE | OROMUCOSAL | Status: DC | PRN
  Administered 2017-01-09: 3 mg via ORAL
  Filled 2017-01-09: qty 9

## 2017-01-09 MED ORDER — ACETAMINOPHEN 500 MG PO TABS
1000.0000 mg | ORAL_TABLET | Freq: Four times a day (QID) | ORAL | Status: DC
  Administered 2017-01-09 – 2017-01-11 (×10): 1000 mg via ORAL
  Filled 2017-01-09 (×12): qty 2

## 2017-01-09 MED ORDER — EPHEDRINE SULFATE 50 MG/ML IJ SOLN
INTRAMUSCULAR | Status: DC | PRN
  Administered 2017-01-09 (×2): 5 mg via INTRAVENOUS

## 2017-01-09 MED ORDER — TRAMADOL HCL 50 MG PO TABS
50.0000 mg | ORAL_TABLET | Freq: Four times a day (QID) | ORAL | Status: DC | PRN
Start: 2017-01-09 — End: 2017-01-12

## 2017-01-09 MED ORDER — OXYCODONE HCL 5 MG PO TABS
5.0000 mg | ORAL_TABLET | ORAL | Status: DC | PRN
  Administered 2017-01-10 (×2): 10 mg via ORAL
  Administered 2017-01-10: 5 mg via ORAL
  Administered 2017-01-11 – 2017-01-12 (×5): 10 mg via ORAL
  Filled 2017-01-09 (×8): qty 2

## 2017-01-09 MED ORDER — SUGAMMADEX SODIUM 200 MG/2ML IV SOLN
INTRAVENOUS | Status: DC | PRN
  Administered 2017-01-09: 100 mg via INTRAVENOUS

## 2017-01-09 MED ORDER — DEXTROSE-NACL 5-0.45 % IV SOLN
INTRAVENOUS | Status: DC
  Administered 2017-01-09 – 2017-01-10 (×3): via INTRAVENOUS

## 2017-01-09 MED ORDER — ALVIMOPAN 12 MG PO CAPS
12.0000 mg | ORAL_CAPSULE | Freq: Two times a day (BID) | ORAL | Status: AC
  Administered 2017-01-10: 12 mg via ORAL
  Filled 2017-01-09: qty 1

## 2017-01-09 MED ORDER — SUCCINYLCHOLINE CHLORIDE 20 MG/ML IJ SOLN
INTRAMUSCULAR | Status: AC
  Filled 2017-01-09: qty 1

## 2017-01-09 SURGICAL SUPPLY — 53 items
ADH SKN CLS APL DERMABOND .7 (GAUZE/BANDAGES/DRESSINGS) ×1
BLADE PHOTON ILLUMINATED (MISCELLANEOUS) ×1 IMPLANT
BLADE SURG 10 STRL SS SAFETY (BLADE) ×2 IMPLANT
BLADE SURG 15 STRL SS SAFETY (BLADE) ×1 IMPLANT
CANISTER SUCT 1200ML W/VALVE (MISCELLANEOUS) ×2 IMPLANT
CHLORAPREP W/TINT 26ML (MISCELLANEOUS) ×2 IMPLANT
COVER CLAMP SIL LG PBX B (MISCELLANEOUS) ×1 IMPLANT
DERMABOND ADVANCED (GAUZE/BANDAGES/DRESSINGS) ×1
DERMABOND ADVANCED .7 DNX12 (GAUZE/BANDAGES/DRESSINGS) IMPLANT
DRAPE LAPAROTOMY 100X77 ABD (DRAPES) ×1 IMPLANT
DRAPE LAPAROTOMY TRNSV 106X77 (MISCELLANEOUS) ×1 IMPLANT
DRSG OPSITE POSTOP 4X10 (GAUZE/BANDAGES/DRESSINGS) ×1 IMPLANT
DRSG OPSITE POSTOP 4X8 (GAUZE/BANDAGES/DRESSINGS) ×1 IMPLANT
ELECT BLADE 6 FLAT ULTRCLN (ELECTRODE) ×1 IMPLANT
ELECT REM PT RETURN 9FT ADLT (ELECTROSURGICAL) ×2
ELECTRODE REM PT RTRN 9FT ADLT (ELECTROSURGICAL) ×1 IMPLANT
GLOVE BIO SURGEON STRL SZ7 (GLOVE) ×19 IMPLANT
GOWN STRL REUS W/ TWL LRG LVL3 (GOWN DISPOSABLE) ×6 IMPLANT
GOWN STRL REUS W/TWL LRG LVL3 (GOWN DISPOSABLE) ×16
HOLDER FOLEY CATH W/STRAP (MISCELLANEOUS) ×1 IMPLANT
KIT RM TURNOVER STRD PROC AR (KITS) ×2 IMPLANT
LABEL OR SOLS (LABEL) ×2 IMPLANT
LIGASURE IMPACT 36 18CM CVD LR (INSTRUMENTS) ×1 IMPLANT
NEEDLE HYPO 22GX1.5 SAFETY (NEEDLE) ×2 IMPLANT
NS IRRIG 500ML POUR BTL (IV SOLUTION) ×2 IMPLANT
PACK BASIN MAJOR ARMC (MISCELLANEOUS) ×2 IMPLANT
PACK COLON CLEAN CLOSURE (MISCELLANEOUS) ×2 IMPLANT
RELOAD PROXIMATE 75MM BLUE (ENDOMECHANICALS) ×2 IMPLANT
RELOAD STAPLE 75 3.8 BLU REG (ENDOMECHANICALS) IMPLANT
RETRACTOR WND ALEXIS-O 25 LRG (MISCELLANEOUS) IMPLANT
RETRACTOR WOUND ALXS 18CM MED (MISCELLANEOUS) IMPLANT
RTRCTR WOUND ALEXIS O 18CM MED (MISCELLANEOUS)
RTRCTR WOUND ALEXIS O 25CM LRG (MISCELLANEOUS) ×2
SET YANKAUER POOLE SUCT (MISCELLANEOUS) ×2 IMPLANT
SHEARS HARMONIC STRL 23CM (MISCELLANEOUS) IMPLANT
SPONGE LAP 18X18 5 PK (GAUZE/BANDAGES/DRESSINGS) ×2 IMPLANT
STAPLER PROXIMATE 75MM BLUE (STAPLE) ×1 IMPLANT
STAPLER SKIN PROX 35W (STAPLE) ×1 IMPLANT
SUT MAXON ABS #0 GS21 30IN (SUTURE) ×2 IMPLANT
SUT MNCRL AB 3-0 PS2 27 (SUTURE) ×1 IMPLANT
SUT PROLENE 0 CT 1 30 (SUTURE) ×4 IMPLANT
SUT SILK 2 0 (SUTURE) ×2
SUT SILK 2-0 18XBRD TIE 12 (SUTURE) ×1 IMPLANT
SUT SILK 3-0 (SUTURE) ×2 IMPLANT
SUT VIC AB 2-0 BRD 54 (SUTURE) ×2 IMPLANT
SUT VIC AB 2-0 CT1 27 (SUTURE) ×2
SUT VIC AB 2-0 CT1 TAPERPNT 27 (SUTURE) IMPLANT
SUT VIC AB 3-0 SH 27 (SUTURE) ×4
SUT VIC AB 3-0 SH 27X BRD (SUTURE) ×1 IMPLANT
SUT VICRYL+ 3-0 144IN (SUTURE) ×2 IMPLANT
SYR BULB IRRIG 60ML STRL (SYRINGE) ×2 IMPLANT
SYR CONTROL 10ML (SYRINGE) ×2 IMPLANT
TRAY FOLEY W/METER SILVER 16FR (SET/KITS/TRAYS/PACK) ×2 IMPLANT

## 2017-01-09 NOTE — H&P (View-Only) (Signed)
Patient ID: Gary Ferrell, male   DOB: 11-Jan-1936, 81 y.o.   MRN: [id number]  Chief Complaint  Patient presents with  . Colonoscopy    HPI Gary Ferrell is a 81 y.o. male here today for his follow up colonoscopy done on 12/26/2016. Son present at visit.  HPI  Past Medical History:  Diagnosis Date  . AICD (automatic cardioverter/defibrillator) present   . BPH (benign prostatic hyperplasia)    a. 06/2012 s/p Greenlight Laser Prostatectomy.  . Carotid disease, bilateral ([place])    a. 1990's s/p L CEA;  b. 11/2004 s/p redo L CEA;  c. 06/2013 Carotid U/S: RICA 58-09%, LICA patent.  . Chronic systolic CHF (congestive heart failure) ([place])    a. 04/2013 Echo: EF 35%, inf DK, inflat AK, diast dysfxn, RV HK, mild MR, nmildly dil LA.  Marland Kitchen CINV (chemotherapy-induced nausea and vomiting)   . CKD (chronic kidney disease), stage II   . Coronary artery disease    a. 2007 s/p MI & CABG.  . Dementia   . Diverticulosis    a. 10/2013 colonoscopy (otw nl study).  Marland Kitchen Dyspnea   . GERD (gastroesophageal reflux disease)   . Hyperlipidemia   . Hypertension   . Ischemic cardiomyopathy    a. 04/2013 Echo: EF 35%;  b. 06/2013 s/p SJM Bi-V ICD, ser # [id number].  . Large B-cell lymphoma ([place]) 06/16/2016  . LBBB (left bundle branch block)   . Lung nodules   . Myocardial infarction ([place])   . OSA (obstructive sleep apnea)    a. 12/2013 Sleep study: severe OSA - CPAP-   . PAD (peripheral artery disease) ([place])    a. 1990's s/p Aortobifem bypass.  . Presence of permanent cardiac pacemaker   . Spinal stenosis   . Stricture of urethral meatus    a. 08/2009 s/p dil.  . Stroke [facility])    a. 2007 Periop CABG - no residual.    Past Surgical History:  Procedure Laterality Date  . AORTA - BILATERAL FEMORAL ARTERY BYPASS GRAFT  1992  . APPENDECTOMY  1964  . AXILLARY LYMPH NODE BIOPSY Left 06/16/2016   LARGE B-CELL LYMPHOMA/ AXILLARY LYMPH NODE BIOPSY;  Surgeon: Christene Lye, MD;  Location: [facility] ORS;  Service:  General;  Laterality: Left;  . BI-VENTRICULAR IMPLANTABLE CARDIOVERTER DEFIBRILLATOR N/A 06/25/2013   Procedure: BI-VENTRICULAR IMPLANTABLE CARDIOVERTER DEFIBRILLATOR  (CRT-D);  Surgeon: Evans Lance, MD;  Location: [facility] CATH LAB;  Service: Cardiovascular;  Laterality: N/A;  . BI-VENTRICULAR IMPLANTABLE CARDIOVERTER DEFIBRILLATOR  (CRT-D)  06/25/2013  . BI-VENTRICULAR IMPLANTABLE CARDIOVERTER DEFIBRILLATOR  (CRT-D)  06/25/13   STJ CRTD implanted by Dr Lovena Le  . CARDIAC CATHETERIZATION  2007; 2012   "unsuccessful attempt to put stents in in 2007, had MI, had to have CABG;"  . CAROTID ENDARTERECTOMY Left 1992; 2006  . CHOLECYSTECTOMY  ~ 1996  . COLON SURGERY  2007   herniated intestinal obstruction  . COLONOSCOPY WITH PROPOFOL N/A 12/20/2016   Procedure: COLONOSCOPY WITH PROPOFOL;  Surgeon: Christene Lye, MD;  Location: [facility] ENDOSCOPY;  Service: Endoscopy;  Laterality: N/A;  . CORONARY ARTERY BYPASS GRAFT  2007   "CABG X3" (06/25/2013)  . GREEN LIGHT LASER TURP (TRANSURETHRAL RESECTION OF PROSTATE N/A 07/02/2012   Procedure: GREEN LIGHT LASER TURP (TRANSURETHRAL RESECTION OF PROSTATE;  Surgeon: Ailene Rud, MD;  Location: [facility] ORS;  Service: Urology;  Laterality: N/A;  . PORTACATH PLACEMENT N/A 07/20/2016   Procedure: INSERTION PORT-A-CATH;  Surgeon: Christene Lye, MD;  Location: [facility]  ORS;  Service: General;  Laterality: N/A;  . SKIN GRAFT  2008   "area of colon surgery wouldn't heal; had this graft over area"  . TONSILLECTOMY    . URETHRAL MEATOPLASTY  08/2009  . URETHRAL STRICTURE DILATATION  02/2010; 09/2010  . VENTRAL HERNIA REPAIR  2008   ventral epigastric hernia repair [Other]    Family History  Problem Relation Age of Onset  . Pancreatic cancer Father   . Diabetes Brother   . Heart disease Brother   . Colon cancer Maternal Grandmother   . Parkinson's disease Mother   . Lung cancer Brother        stage 4    Social History Social History  Substance Use  Topics  . Smoking status: Former Smoker    Packs/day: 1.00    Years: 20.00    Types: Cigarettes    Quit date: 04/11/1989  . Smokeless tobacco: Never Used  . Alcohol use No    Allergies  Allergen Reactions  . Dapsone Other (See Comments)    REACTION: caused neuropathy/ affected walking  patient stated he received high dose of dapsone which caused the neuropathy     Current Outpatient Prescriptions  Medication Sig Dispense Refill  . acetaminophen (TYLENOL) 500 MG tablet Take 1,000 mg by mouth every 4 (four) hours as needed for mild pain or moderate pain.    Marland Kitchen alum & mag hydroxide-simeth (MAALOX/MYLANTA) 200-200-20 MG/5ML suspension Take 30 mLs by mouth every 6 (six) hours as needed for indigestion or heartburn.    . carvedilol (COREG) 3.125 MG tablet TAKE 1 TABLET BY MOUTH 2 (TWO) TIMES DAILY WITH MEALS. (Patient taking differently: TAKE 0.5 TABLET (1.5625 MG) BY MOUTH TWICE DAILY (0600 & 1700)) 180 tablet 1  . cholecalciferol (VITAMIN D) 1000 UNITS tablet Take 1,000 Units by mouth daily. (0600)    . donepezil (ARICEPT) 5 MG tablet Take 5 mg by mouth at bedtime. (0600)    . finasteride (PROSCAR) 5 MG tablet Take 5 mg by mouth daily. (0800)    . lidocaine-prilocaine (EMLA) cream Apply 1 application topically as needed. Apply to port cath site 1 hour prior to access. (Patient not taking: Reported on 12/30/2016) 30 g 3  . loperamide (IMODIUM A-D) 2 MG tablet Take 2 mg by mouth 4 (four) times daily as needed for diarrhea or loose stools.    . magic mouthwash w/lidocaine SOLN Take 5 mLs by mouth 4 (four) times daily. (Patient taking differently: Take 5 mLs by mouth 4 (four) times daily. (0800, 1200, 1600, 1800)) 240 mL 3  . magnesium hydroxide (MILK OF MAGNESIA) 400 MG/5ML suspension Take 30 mLs by mouth daily as needed for mild constipation.    . Multiple Vitamin (MULTIVITAMIN WITH MINERALS) TABS Take 1 tablet by mouth daily. (0600)    . Neomycin-Bacitracin-Polymyxin (NEOPORACIN EX) Apply 1  application topically every 12 (twelve) hours as needed (abrasion).    . ondansetron (ZOFRAN) 8 MG tablet Take one pill every 8 hours as needed for nasuea/ vomitting. (Patient not taking: Reported on 12/30/2016) 40 tablet 1  . predniSONE (DELTASONE) 50 MG tablet Pt to take 2 tablets daily for 5 days starting on the day of chemotherapy regimen.   This rx will need to be with each treatment. Take in AM with food. 10 tablet 0  . rosuvastatin (CRESTOR) 20 MG tablet Take 20 mg by mouth daily. (0800) Reported on 06/16/2015    . tamsulosin (FLOMAX) 0.4 MG CAPS capsule Take 0.4 mg by mouth daily. (  0800)    . aspirin 81 MG chewable tablet Chew 81 mg by mouth daily. (0800)    . aspirin EC 325 MG tablet Take 325 mg by mouth daily as needed (for chest pain as directed).    Marland Kitchen guaiFENesin (SILTUSSIN DAS) 100 MG/5ML liquid Take 200 mg by mouth 4 (four) times daily as needed for cough.    . Lidocaine (ALOE BURN RELIEF) 0.5 % AERO Apply 1 application topically every 6 (six) hours as needed (for itch/rash.).    Derrill Memo ON 01/08/2017] metroNIDAZOLE (FLAGYL) 500 MG tablet Take one (1) tablet at 6 pm and one (1) tablet at 11 pm the evening prior to surgery. 2 tablet 0  . neomycin (MYCIFRADIN) 500 MG tablet Take two (2) tablets at 6 pm and two (2) tablets at 11 pm the evening prior to surgery. 4 tablet 0  . polyethylene glycol powder (GLYCOLAX/MIRALAX) powder 255 grams one bottle for bowel prep 255 g 0   No current facility-administered medications for this visit.     Review of Systems Review of Systems  Constitutional: Negative.   Respiratory: Negative.   Cardiovascular: Negative.     Blood pressure 130/78, pulse 68, resp. rate 14, height 5\' 11"  (1.803 m), weight 161 lb (73 kg).  Physical Exam Physical Exam  Constitutional: He is oriented to person, place, and time. He appears well-developed and well-nourished.  Eyes: Conjunctivae are normal. No scleral icterus.  Neck: Neck supple.  Cardiovascular: Normal  rate, regular rhythm and normal heart sounds.   Pulmonary/Chest: Effort normal and breath sounds normal. No respiratory distress.  Abdominal: Soft. Bowel sounds are normal. There is no tenderness.    Lymphadenopathy:    He has no cervical adenopathy.  Neurological: He is alert and oriented to person, place, and time.  Skin: Skin is warm and dry.  Psychiatric: Thought content normal.    Data Reviewed Recent colonoscopy, pathology, PET scan, and notes reviewed Colonoscopy showed a firm hard circumferential mass. Biopsy confirmed adenocarcinoma Assessment    Ca cecum - recent colonoscopy results and pathology of cecum mass was discussed with the patient including the recommendation for a rightcolectomy. PET scan shows there is no current metastasis. Patient expressed agreement to proceed with surgery. Surgery is best accomplished with right transverse incision to avoid midline thinned out fascia. 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  I agree with the above.  Haematologist has been used and any errors in dictation or transcription are unintentional.  Seeplaputhur G. Jamal Collin, M.D., F.A.C.S.   Junie Panning G 12/30/2016, 4:08 PM  Patient's surgery has been scheduled for 01-09-17 at [facility]. The patient will be asked to complete a bowel prep with antibiotics. It is okay for patient to continue an 81 mg aspirin once daily.    Dominga Ferry, CMA

## 2017-01-09 NOTE — Anesthesia Post-op Follow-up Note (Signed)
Anesthesia QCDR form completed.        

## 2017-01-09 NOTE — H&P (View-Only) (Signed)
Gary Ferrell is an 81 y.o. male.   Chief Complaint: Here for colonoscopy HPI:81 yr old male with mild demnentia, was treatyed for lymphoma with good results. Recent surveillance PET scan showed hypermetabolic area in cecum with wall thickening. Colonoscopy was recommended and pt here for same. See details  in OV note from 11/16/16  Past Medical History:  Diagnosis Date  . AICD (automatic cardioverter/defibrillator) present   . BPH (benign prostatic hyperplasia)    a. 06/2012 s/p Greenlight Laser Prostatectomy.  . Carotid disease, bilateral ([place])    a. 1990's s/p L CEA;  b. 11/2004 s/p redo L CEA;  c. 06/2013 Carotid U/S: RICA 81-15%, LICA patent.  . Chronic systolic CHF (congestive heart failure) ([place])    a. 04/2013 Echo: EF 35%, inf DK, inflat AK, diast dysfxn, RV HK, mild MR, nmildly dil LA.  Marland Kitchen CINV (chemotherapy-induced nausea and vomiting)   . CKD (chronic kidney disease), stage II   . Coronary artery disease    a. 2007 s/p MI & CABG.  . Dementia   . Diverticulosis    a. 10/2013 colonoscopy (otw nl study).  Marland Kitchen Dyspnea   . GERD (gastroesophageal reflux disease)   . Hyperlipidemia   . Hypertension   . Ischemic cardiomyopathy    a. 04/2013 Echo: EF 35%;  b. 06/2013 s/p SJM Bi-V ICD, ser # [id number].  . Large B-cell lymphoma ([place]) 06/16/2016  . LBBB (left bundle branch block)   . Lung nodules   . Myocardial infarction ([place])   . OSA (obstructive sleep apnea)    a. 12/2013 Sleep study: severe OSA - CPAP-   . PAD (peripheral artery disease) ([place])    a. 1990's s/p Aortobifem bypass.  . Presence of permanent cardiac pacemaker   . Spinal stenosis   . Stricture of urethral meatus    a. 08/2009 s/p dil.  . Stroke [facility])    a. 2007 Periop CABG - no residual.    Past Surgical History:  Procedure Laterality Date  . AORTA - BILATERAL FEMORAL ARTERY BYPASS GRAFT  1992  . APPENDECTOMY  1964  . AXILLARY LYMPH NODE BIOPSY Left 06/16/2016   LARGE B-CELL LYMPHOMA/ AXILLARY LYMPH NODE BIOPSY;   Surgeon: Christene Lye, MD;  Location: [facility] ORS;  Service: General;  Laterality: Left;  . BI-VENTRICULAR IMPLANTABLE CARDIOVERTER DEFIBRILLATOR N/A 06/25/2013   Procedure: BI-VENTRICULAR IMPLANTABLE CARDIOVERTER DEFIBRILLATOR  (CRT-D);  Surgeon: Evans Lance, MD;  Location: [facility] CATH LAB;  Service: Cardiovascular;  Laterality: N/A;  . BI-VENTRICULAR IMPLANTABLE CARDIOVERTER DEFIBRILLATOR  (CRT-D)  06/25/2013  . BI-VENTRICULAR IMPLANTABLE CARDIOVERTER DEFIBRILLATOR  (CRT-D)  06/25/13   STJ CRTD implanted by Dr Lovena Le  . CARDIAC CATHETERIZATION  2007; 2012   "unsuccessful attempt to put stents in in 2007, had MI, had to have CABG;"  . CAROTID ENDARTERECTOMY Left 1992; 2006  . CHOLECYSTECTOMY  ~ 1996  . COLON SURGERY  2007   herniated intestinal obstruction  . CORONARY ARTERY BYPASS GRAFT  2007   "CABG X3" (06/25/2013)  . GREEN LIGHT LASER TURP (TRANSURETHRAL RESECTION OF PROSTATE N/A 07/02/2012   Procedure: GREEN LIGHT LASER TURP (TRANSURETHRAL RESECTION OF PROSTATE;  Surgeon: Ailene Rud, MD;  Location: [facility] ORS;  Service: Urology;  Laterality: N/A;  . PORTACATH PLACEMENT N/A 07/20/2016   Procedure: INSERTION PORT-A-CATH;  Surgeon: Christene Lye, MD;  Location: [facility] ORS;  Service: General;  Laterality: N/A;  . SKIN GRAFT  2008   "area of colon surgery wouldn't heal; had this graft over area"  .  TONSILLECTOMY    . URETHRAL MEATOPLASTY  08/2009  . URETHRAL STRICTURE DILATATION  02/2010; 09/2010  . VENTRAL HERNIA REPAIR  2008   ventral epigastric hernia repair [Other]    Family History  Problem Relation Age of Onset  . Pancreatic cancer Father   . Diabetes Brother   . Heart disease Brother   . Colon cancer Maternal Grandmother   . Parkinson's disease Mother   . Lung cancer Brother        stage 4   Social History:  reports that he quit smoking about 27 years ago. His smoking use included Cigarettes. He has a 20.00 pack-year smoking history. He has never used smokeless  tobacco. He reports that he does not drink alcohol or use drugs.  Allergies:  Allergies  Allergen Reactions  . Dapsone Other (See Comments)    REACTION: caused neuropathy/ affected walking  patient stated he received high dose of dapsone which caused the neuropathy     Medications Prior to Admission  Medication Sig Dispense Refill  . acetaminophen (TYLENOL) 500 MG tablet Take 1,000 mg by mouth every 4 (four) hours as needed for mild pain or moderate pain.    Marland Kitchen alum & mag hydroxide-simeth (MAALOX/MYLANTA) 200-200-20 MG/5ML suspension Take 30 mLs by mouth every 6 (six) hours as needed for indigestion or heartburn.    Marland Kitchen aspirin EC 81 MG tablet Take 81 mg by mouth daily.    . carvedilol (COREG) 3.125 MG tablet TAKE 1 TABLET BY MOUTH 2 (TWO) TIMES DAILY WITH MEALS. (Patient taking differently: TAKE 1/2 TABLET BY MOUTH 2 (TWO) TIMES DAILY WITH MEALS.) 180 tablet 1  . cholecalciferol (VITAMIN D) 1000 UNITS tablet Take 1,000 Units by mouth daily.    . diclofenac (VOLTAREN) 0.1 % ophthalmic solution Apply 1 drop to eye 4 (four) times daily. Both eyes    . donepezil (ARICEPT) 5 MG tablet Take 1 tablet by mouth at bedtime.    . finasteride (PROSCAR) 5 MG tablet Take 5 mg by mouth daily.    Marland Kitchen lidocaine-prilocaine (EMLA) cream Apply 1 application topically as needed. Apply to port cath site 1 hour prior to access. 30 g 3  . loperamide (IMODIUM A-D) 2 MG tablet Take 2 mg by mouth 4 (four) times daily as needed for diarrhea or loose stools.    . magic mouthwash w/lidocaine SOLN Take 5 mLs by mouth 4 (four) times daily. 240 mL 3  . magnesium hydroxide (MILK OF MAGNESIA) 400 MG/5ML suspension Take 30 mLs by mouth daily as needed for mild constipation.    . Multiple Vitamin (MULTIVITAMIN WITH MINERALS) TABS Take 1 tablet by mouth daily.    Marland Kitchen Neomycin-Bacitracin-Polymyxin (NEOPORACIN EX) Apply 1 application topically every 12 (twelve) hours as needed (abrasion).    . ondansetron (ZOFRAN) 8 MG tablet Take one  pill every 8 hours as needed for nasuea/ vomitting. 40 tablet 1  . predniSONE (DELTASONE) 50 MG tablet Pt to take 2 tablets daily for 5 days starting on the day of chemotherapy regimen.   This rx will need to be with each treatment. Take in AM with food. 10 tablet 0  . rosuvastatin (CRESTOR) 20 MG tablet Take 20 mg by mouth every morning. Reported on 06/16/2015    . tamsulosin (FLOMAX) 0.4 MG CAPS capsule Take 1 capsule by mouth daily.      No results found for this or any previous visit (from the past 48 hour(s)). No results found.  Review of Systems  Constitutional: Negative.  Respiratory: Negative.   Cardiovascular: Negative.   Gastrointestinal: Negative.     Blood pressure (!) 158/74, pulse 81, temperature 97.6 F (36.4 C), temperature source Tympanic, resp. rate 16, height 5\' 11"  (1.803 m), weight 160 lb (72.6 kg), SpO2 100 %. Physical Exam  Constitutional: He appears well-developed and well-nourished.  Eyes: Conjunctivae are normal. No scleral icterus.  Neck: Neck supple.  Cardiovascular: Normal rate, regular rhythm and normal heart sounds.   Respiratory: Effort normal and breath sounds normal.  GI: Soft. Bowel sounds are normal. He exhibits no distension and no mass.  Lymphadenopathy:    He has no cervical adenopathy.  Neurological: He is alert.  Skin: Skin is warm and dry.     Assessment/Plan Abnormal imaging of Cecum on PET scan.  Colonoscopy as scheduled  Christene Lye, MD 12/20/2016, 1:33 PM

## 2017-01-09 NOTE — Op Note (Signed)
Preop diagnosis:carcinoma cecum  Post op diagnosis: same  Operation: laparotomy and right hemicolectomy  Surgeon: Mckinley Jewel  Assistant: Vicenta Dunning   Anesthesia: Gen.  Complications: none  EBL: less than 50 mL  Drains: one  Description: patient was put to sleep and endotracheal tube and the abdomen prepped and draped sterile field. Timeout was performed. Patient has had multiple interventions and low midline abdomen including repair of hernias twice with mesh and accordin ifright transverse incision was planned to avoid the midline. 30 mL of 0.5% Marcaine with epi was instilled surrounding the planned incision and particularly along the line of the ninth 10th nerve. Given the patient had the defibrillator the plasma blade was utilized in place a regular cautery. Skin incision was made and deepened through to the anterior sheath rectus which was opened along the same line as also the rectus muscle and part of the muscle lateral to the rectus sheath. Posterior sheath and peritoneum were then opened. Adhesions were encountered between the omentum and some of the bowel to the abdominal wall but these were first taken down to free up the space and the peritoneal cavity adequately exposed-subsequent dissection involved freeing up some adhesions from previous surgeries.the terminal ileum was bound down towards the right side of the pelvis and also towards the midline area and this was carefully taken down. The right colon was immobilized lateral aspect and dissected  around the hepatic flexure.after the right colon was adequately mobilized and the duodenum identified near the root of the mucin: Formal resection was then mapped out. Lines of resection in the terminal ileum and the proximal transverse colon were identified- the mesentery then freed from the bowel.the mesentery was then taken down towards the root of the main right colic vessels using the LigaSure device. The main vessel was then doubly  ligated with 0-silk and cut.a side to side anastomosis of the terminal ileum to the transverse colon was then made using the GIA stapling device. The bowel was then transected and the remaining opening closed with a econd application of the stapler. The right colon was then placed in the side table and subsequently opened at the end of the first part of the procedure. His was subsequently plac in formalin and sent sent to pathology. The cecum had a very large ulcerated lesion occupying more than two thirds of the circumference. The mesenteric opening was closed with a runing 3-0 Vicryl. Gowns and gloves were changed fresh drapes placed around the incision. Cavity was irrigated with some saline and this fluid was suctioned out. Hemostasis was intact. Posterior sheath and peritoneum cla running 0 Vicryl. The fascia and the muscle laterally closed with interrupted figure-of-eight stitches of 0 Maxon. Subcutaneous tissue closed with 3-0 Vicryl. Skin was closed with subcuticular 3-0 Monocryl.Dermabond was applied. Patient subsequently was extubated and returned recovery room stable condition.

## 2017-01-09 NOTE — Anesthesia Procedure Notes (Signed)
Procedure Name: Intubation Date/Time: 01/09/2017 7:39 AM Performed by: Zetta Bills Pre-anesthesia Checklist: Patient identified, Emergency Drugs available, Suction available and Patient being monitored Patient Re-evaluated:Patient Re-evaluated prior to induction Oxygen Delivery Method: Circle system utilized Preoxygenation: Pre-oxygenation with 100% oxygen Induction Type: IV induction Ventilation: Mask ventilation without difficulty Laryngoscope Size: Mac and 4 Grade View: Grade II Tube type: Oral Tube size: 7.0 mm Number of attempts: 1 Airway Equipment and Method: Patient positioned with wedge pillow and Stylet Placement Confirmation: ETT inserted through vocal cords under direct vision Secured at: 22 cm Tube secured with: Tape Dental Injury: Teeth and Oropharynx as per pre-operative assessment

## 2017-01-09 NOTE — Interval H&P Note (Signed)
History and Physical Interval Note:  01/09/2017 7:04 AM  Gary Ferrell  has presented today for surgery, with the diagnosis of CANCER CECUM  The various methods of treatment have been discussed with the patient and family. After consideration of risks, benefits and other options for treatment, the patient has consented to  Procedure(s): RIGHT COLECTOMY (Right) as a surgical intervention .  The patient's history has been reviewed, patient examined, no change in status, stable for surgery.  I have reviewed the patient's chart and labs.  Questions were answered to the patient's satisfaction.     Lexington Devine G

## 2017-01-09 NOTE — Transfer of Care (Addendum)
Immediate Anesthesia Transfer of Care Note  Patient: Gary Ferrell  Procedure(s) Performed: RIGHT COLECTOMY (Right )  Patient Location: PACU  Anesthesia Type:General  Level of Consciousness: awake  Airway & Oxygen Therapy: Patient connected to face mask oxygen  Post-op Assessment: Report given to RN  Post vital signs: stable  Last Vitals:  Vitals:   01/09/17 0633 01/09/17 1013  BP: (!) 132/51 (!) 141/62  Pulse: 74 83  Resp: 17 15  Temp: 36.9 C 36.4 C  SpO2: 100% 100%    Last Pain:  Vitals:   01/09/17 0633  TempSrc: Tympanic  PainSc: 0-No pain     Past Medical History:  Diagnosis Date  . AICD (automatic cardioverter/defibrillator) present   . BPH (benign prostatic hyperplasia)    a. 06/2012 s/p Greenlight Laser Prostatectomy.  . Carotid disease, bilateral ([place])    a. 1990's s/p L CEA;  b. 11/2004 s/p redo L CEA;  c. 06/2013 Carotid U/S: RICA 41-66%, LICA patent.  . Chronic systolic CHF (congestive heart failure) ([place])    a. 04/2013 Echo: EF 35%, inf DK, inflat AK, diast dysfxn, RV HK, mild MR, nmildly dil LA.  Marland Kitchen CINV (chemotherapy-induced nausea and vomiting)   . CKD (chronic kidney disease), stage II   . Coronary artery disease    a. 2007 s/p MI & CABG.  . Dementia   . Diverticulosis    a. 10/2013 colonoscopy (otw nl study).  Marland Kitchen Dyspnea   . GERD (gastroesophageal reflux disease)   . Hyperlipidemia   . Hypertension   . Ischemic cardiomyopathy    a. 04/2013 Echo: EF 35%;  b. 06/2013 s/p SJM Bi-V ICD, ser # [id number].  . Large B-cell lymphoma ([place]) 06/16/2016  . LBBB (left bundle branch block)   . Lung nodules   . Myocardial infarction ([place])   . OSA (obstructive sleep apnea)    a. 12/2013 Sleep study: severe OSA - CPAP-   . PAD (peripheral artery disease) ([place])    a. 1990's s/p Aortobifem bypass.  . Presence of permanent cardiac pacemaker   . Spinal stenosis   . Stricture of urethral meatus    a. 08/2009 s/p dil.  . Stroke [facility])    a. 2007 Periop CABG - no  residual.   Past Surgical History:  Procedure Laterality Date  . AORTA - BILATERAL FEMORAL ARTERY BYPASS GRAFT  1992  . APPENDECTOMY  1964  . AXILLARY LYMPH NODE BIOPSY Left 06/16/2016   LARGE B-CELL LYMPHOMA/ AXILLARY LYMPH NODE BIOPSY;  Surgeon: Christene Lye, MD;  Location: [facility] ORS;  Service: General;  Laterality: Left;  . BI-VENTRICULAR IMPLANTABLE CARDIOVERTER DEFIBRILLATOR N/A 06/25/2013   Procedure: BI-VENTRICULAR IMPLANTABLE CARDIOVERTER DEFIBRILLATOR  (CRT-D);  Surgeon: Evans Lance, MD;  Location: [facility] CATH LAB;  Service: Cardiovascular;  Laterality: N/A;  . BI-VENTRICULAR IMPLANTABLE CARDIOVERTER DEFIBRILLATOR  (CRT-D)  06/25/2013  . BI-VENTRICULAR IMPLANTABLE CARDIOVERTER DEFIBRILLATOR  (CRT-D)  06/25/13   STJ CRTD implanted by Dr Lovena Le  . CARDIAC CATHETERIZATION  2007; 2012   "unsuccessful attempt to put stents in in 2007, had MI, had to have CABG;"  . CAROTID ENDARTERECTOMY Left 1992; 2006  . CHOLECYSTECTOMY  ~ 1996  . COLON SURGERY  2007   herniated intestinal obstruction  . COLONOSCOPY WITH PROPOFOL N/A 12/20/2016   Procedure: COLONOSCOPY WITH PROPOFOL;  Surgeon: Christene Lye, MD;  Location: [facility] ENDOSCOPY;  Service: Endoscopy;  Laterality: N/A;  . CORONARY ARTERY BYPASS GRAFT  2007   "CABG X3" (06/25/2013)  . GREEN LIGHT LASER  TURP (TRANSURETHRAL RESECTION OF PROSTATE N/A 07/02/2012   Procedure: GREEN LIGHT LASER TURP (TRANSURETHRAL RESECTION OF PROSTATE;  Surgeon: Ailene Rud, MD;  Location: [facility] ORS;  Service: Urology;  Laterality: N/A;  . PORTACATH PLACEMENT N/A 07/20/2016   Procedure: INSERTION PORT-A-CATH;  Surgeon: Christene Lye, MD;  Location: [facility] ORS;  Service: General;  Laterality: N/A;  . SKIN GRAFT  2008   "area of colon surgery wouldn't heal; had this graft over area"  . TONSILLECTOMY    . URETHRAL MEATOPLASTY  08/2009  . URETHRAL STRICTURE DILATATION  02/2010; 09/2010  . VENTRAL HERNIA REPAIR  2008   ventral epigastric hernia  repair [Other]       Scheduled Meds: . Chlorhexidine Gluconate Cloth  6 each Topical Once   Continuous Infusions: . lactated ringers 50 mL/hr at 01/09/17 0647   PRN Meds:.fentaNYL (SUBLIMAZE) injection, ondansetron (ZOFRAN) IV Complications: No apparent anesthesia complications

## 2017-01-09 NOTE — Interval H&P Note (Signed)
History and Physical Interval Note:  01/09/2017 7:04 AM  Gary Ferrell  has presented today for surgery, with the diagnosis of CANCER CECUM  The various methods of treatment have been discussed with the patient and family. After consideration of risks, benefits and other options for treatment, the patient has consented to  Procedure(s): RIGHT COLECTOMY (Right) as a surgical intervention .  The patient's history has been reviewed, patient examined, no change in status, stable for surgery.  I have reviewed the patient's chart and labs.  Questions were answered to the patient's satisfaction.     SANKAR,SEEPLAPUTHUR G

## 2017-01-09 NOTE — Anesthesia Postprocedure Evaluation (Signed)
Anesthesia Post Note  Patient: Gary Ferrell  Procedure(s) Performed: RIGHT COLECTOMY (Right )  Patient location during evaluation: PACU Anesthesia Type: General Level of consciousness: awake and alert Pain management: pain level controlled Vital Signs Assessment: post-procedure vital signs reviewed and stable Respiratory status: spontaneous breathing, nonlabored ventilation, respiratory function stable and patient connected to nasal cannula oxygen Cardiovascular status: blood pressure returned to baseline and stable Postop Assessment: no apparent nausea or vomiting Anesthetic complications: no     Last Vitals:  Vitals:   01/09/17 1144 01/09/17 1250  BP: (!) 130/52 (!) 133/58  Pulse: 76 78  Resp: 16 16  Temp: (!) 36.4 C 36.9 C  SpO2: 98% 100%    Last Pain:  Vitals:   01/09/17 1250  TempSrc: Oral  PainSc:                  Martha Clan

## 2017-01-09 NOTE — Anesthesia Preprocedure Evaluation (Signed)
Anesthesia Evaluation  Patient identified by MRN, date of birth, ID band Patient awake    Reviewed: Allergy & Precautions, H&P , NPO status , Patient's Chart, lab work & pertinent test results, reviewed documented beta blocker date and time   History of Anesthesia Complications Negative for: history of anesthetic complications  Airway Mallampati: II  TM Distance: >3 FB Neck ROM: full    Dental  (+) Dental Advidsory Given, Poor Dentition   Pulmonary shortness of breath, sleep apnea , neg COPD, neg recent URI, former smoker,           Cardiovascular Exercise Tolerance: Good hypertension, (-) angina+ CAD, + Past MI, + CABG, + Peripheral Vascular Disease and +CHF  (-) Cardiac Stents negative cardio ROS  + dysrhythmias (LBBB) + pacemaker + Cardiac Defibrillator (-) Valvular Problems/Murmurs     Neuro/Psych neg Seizures PSYCHIATRIC DISORDERS (Dementia) TIACVA, Residual Symptoms    GI/Hepatic Neg liver ROS, GERD  ,  Endo/Other  negative endocrine ROS  Renal/GU CRFRenal disease  negative genitourinary   Musculoskeletal   Abdominal   Peds  Hematology negative hematology ROS (+)   Anesthesia Other Findings Past Medical History: No date: AICD (automatic cardioverter/defibrillator) present No date: BPH (benign prostatic hyperplasia)     Comment:  a. 06/2012 s/p Greenlight Laser Prostatectomy. No date: Carotid disease, bilateral ([place])     Comment:  a. 1990's s/p L CEA;  b. 11/2004 s/p redo L CEA;  c.               06/2013 Carotid U/S: RICA 03-50%, LICA patent. No date: Chronic systolic CHF (congestive heart failure) ([place])     Comment:  a. 04/2013 Echo: EF 35%, inf DK, inflat AK, diast dysfxn,              RV HK, mild MR, nmildly dil LA. No date: CINV (chemotherapy-induced nausea and vomiting) No date: CKD (chronic kidney disease), stage II No date: Coronary artery disease     Comment:  a. 2007 s/p MI & CABG. No date:  Dementia No date: Diverticulosis     Comment:  a. 10/2013 colonoscopy (otw nl study). No date: Dyspnea No date: GERD (gastroesophageal reflux disease) No date: Hyperlipidemia No date: Hypertension No date: Ischemic cardiomyopathy     Comment:  a. 04/2013 Echo: EF 35%;  b. 06/2013 s/p SJM Bi-V ICD, ser              # [id number]. 06/16/2016: Large B-cell lymphoma ([place]) No date: LBBB (left bundle branch block) No date: Lung nodules No date: Myocardial infarction ([place]) No date: OSA (obstructive sleep apnea)     Comment:  a. 12/2013 Sleep study: severe OSA - CPAP-  No date: PAD (peripheral artery disease) ([place])     Comment:  a. 1990's s/p Aortobifem bypass. No date: Presence of permanent cardiac pacemaker No date: Spinal stenosis No date: Stricture of urethral meatus     Comment:  a. 08/2009 s/p dil. No date: Stroke [facility])     Comment:  a. 2007 Periop CABG - no residual.   Reproductive/Obstetrics negative OB ROS                             Anesthesia Physical Anesthesia Plan  ASA: III  Anesthesia Plan: General   Post-op Pain Management:    Induction: Intravenous  PONV Risk Score and Plan: 2 and Ondansetron, Dexamethasone and Treatment may vary due to age or medical condition  Airway Management Planned: Oral ETT  Additional Equipment:   Intra-op Plan:   Post-operative Plan: Extubation in OR  Informed Consent: I have reviewed the patients History and Physical, chart, labs and discussed the procedure including the risks, benefits and alternatives for the proposed anesthesia with the patient or authorized representative who has indicated his/her understanding and acceptance.   Dental Advisory Given  Plan Discussed with: Anesthesiologist, CRNA and Surgeon  Anesthesia Plan Comments:         Anesthesia Quick Evaluation

## 2017-01-10 ENCOUNTER — Other Ambulatory Visit: Payer: Self-pay | Admitting: Pathology

## 2017-01-10 ENCOUNTER — Encounter: Payer: Self-pay | Admitting: General Surgery

## 2017-01-10 LAB — CBC
HCT: 31.8 % — ABNORMAL LOW (ref 40.0–52.0)
Hemoglobin: 10.9 g/dL — ABNORMAL LOW (ref 13.0–18.0)
MCH: 27.5 pg (ref 26.0–34.0)
MCHC: 34.1 g/dL (ref 32.0–36.0)
MCV: 80.7 fL (ref 80.0–100.0)
PLATELETS: 132 10*3/uL — AB (ref 150–440)
RBC: 3.94 MIL/uL — AB (ref 4.40–5.90)
RDW: 14.1 % (ref 11.5–14.5)
WBC: 8.5 10*3/uL (ref 3.8–10.6)

## 2017-01-10 LAB — BASIC METABOLIC PANEL
Anion gap: 5 (ref 5–15)
BUN: 11 mg/dL (ref 6–20)
CALCIUM: 8.3 mg/dL — AB (ref 8.9–10.3)
CO2: 25 mmol/L (ref 22–32)
Chloride: 105 mmol/L (ref 101–111)
Creatinine, Ser: 0.76 mg/dL (ref 0.61–1.24)
GFR calc Af Amer: >60 mL/min (ref >60)
GLUCOSE: 205 mg/dL — AB (ref 65–99)
POTASSIUM: 4.2 mmol/L (ref 3.5–5.1)
SODIUM: 135 mmol/L (ref 135–145)

## 2017-01-10 LAB — GLUCOSE, CAPILLARY: GLUCOSE-CAPILLARY: 159 mg/dL — AB (ref 65–99)

## 2017-01-10 MED ORDER — SODIUM CHLORIDE 0.45 % IV SOLN
INTRAVENOUS | Status: DC
Start: 2017-01-10 — End: 2017-01-12
  Administered 2017-01-10 – 2017-01-11 (×2): via INTRAVENOUS

## 2017-01-10 NOTE — Progress Notes (Signed)
Patient ID: Gary Ferrell, male   DOB: 05-24-1935, 81 y.o.   MRN: [id number] Patient is POD#1 s/p right hemicolectomy for cecum cancer on 01/09/17. He is pleasant and doing well and has no complaints of pain or fever. He is drinking plenty of water and urine is clear. Denies abdominal pain or tenderness. He has abdominal distension and states that this is his baseline since his bilateral femoral artery bypass. He has not been walking.   AVSS, has remained afebrile and not tachycardic since admission.  Good urine output Physical exam shows mild abdominal distension, normoactive bowel sounds. No tenderness to palpation. Incision site is clean, dry, and intact. Lungs are clear to auscultation bilaterally. Heart is regular rate and rhythm.  CBC shows Hgb   down   to 10.9, preop was 12.6 BMP shows elevated glucose 205, rest are okay  Assessment: Doing well with minimal pain. He is anxious to get up and walk.  Plan: Encourage ambulation. Remove foley catheter. Discontinue D5NS 100 mL and replace with 1/2 NS 75 cc/hr given good oral hydration and elevated glucose. Pathology of cecum mass pending.

## 2017-01-10 NOTE — Clinical Social Work Note (Signed)
Clinical Social Work Assessment  Patient Details  Name: Gary Ferrell MRN: [id number] Date of Birth: 08-30-1935  Date of referral:  01/10/17               Reason for consult:  Discharge Planning                Permission sought to share information with:    Permission granted to share information::     Name::        Agency::     Relationship::     Contact Information:     Housing/Transportation Living arrangements for the past 2 months:  [place] of Information:  Facility Patient Interpreter Needed:  None Criminal Activity/Legal Involvement Pertinent to Current Situation/Hospitalization:  No - Comment as needed Significant Relationships:  Adult Children Lives with:  Facility Resident Do you feel safe going back to the place where you live?    Need for family participation in patient care:  Yes (Comment)  Care giving concerns:  None   Social Worker assessment / plan:  CSW informed patient is from [facility] in [place]. CSW contacted [facility] and spoke with Augustin Coupe: [phone] and fax: [phone]. Katharine Look stated that they will take patient back at discharge and that they can potentially pick him up or his son might be able to bring him back. CSW will follow and facilitate getting patient back when time to discharge to [place]. Patient had an elective surgery which he is in the hospital currently for.  Employment status:  Retired Forensic scientist:  Information systems manager, Medicaid [place] PT Recommendations:    Information / Referral to community resources:     Patient/Family's Response to care:  Patient sleeping at time of CSW visit.  Patient/Family's Understanding of and Emotional Response to Diagnosis, Current Treatment, and Prognosis:  Patient sleeping at time of CSW visit.  Emotional Assessment Appearance:  Appears stated age Attitude/Demeanor/Rapport:   (sleeping) Affect (typically observed):   (sleeping) Orientation:   Fluctuating Orientation (Suspected and/or reported Sundowners) Alcohol / Substance use:  Not Applicable Psych involvement (Current and /or in the community):  No (Comment)  Discharge Needs  Concerns to be addressed:  Care Coordination Readmission within the last 30 days:  No Current discharge risk:  None Barriers to Discharge:  No Barriers Identified   Shela Leff, LCSW 01/10/2017, 2:38 PM

## 2017-01-11 LAB — SURGICAL PATHOLOGY

## 2017-01-11 NOTE — Progress Notes (Signed)
Patient ID: Gary Ferrell, male   DOB: [birth year], 81 y.o.   MRN: [id number] Pt with no complaints. Reports he has passed flatus bit no bm yet. No n/v. AVSS. Abdomen is  soft, much less distended today, good bowel sounds. Incision clean. Lungs clear. Advance diet. Path reviewed. Pt not informed yet. T3, N2 ( 4/20 nodes pos. Will talk to his son and advise on this.

## 2017-01-12 MED ORDER — TRAMADOL HCL 50 MG PO TABS: 50.0000 mg | ORAL_TABLET | Freq: Four times a day (QID) | ORAL | 0 refills | Status: DC | PRN

## 2017-01-12 MED ORDER — PANTOPRAZOLE SODIUM 40 MG PO TBEC: 40.0000 mg | DELAYED_RELEASE_TABLET | Freq: Every day | ORAL | Status: DC

## 2017-01-12 NOTE — Clinical Social Work Note (Signed)
CSW informed patient to discharge back to [facility] today. CSW has spoken to [place] at [place] and they are sending someone to pick patient up. Discharge information has been sent to [facility] for review. Nurse to call report. CSW has informed patient's son of the discharge and he was wanting [facility] to transport due to him not being able to come get patient until after 6pm. CSW informed him that if [facility] could not do the transport CSW would call him to let him know. As it turns out, [facility] will be able to transport. Shela Leff MSW,LCSW [phone]

## 2017-01-12 NOTE — Care Management Important Message (Signed)
Important Message  Patient Details  Name: Gary Ferrell MRN: [id number] Date of Birth: [birth year]   Medicare Important Message Given:  Yes    Beverly Sessions, RN 01/12/2017, 2:26 PM

## 2017-01-12 NOTE — Progress Notes (Signed)
PHARMACIST - PHYSICIAN COMMUNICATION  DR:   Jamal Collin  CONCERNING: IV to Oral Route Change Policy  RECOMMENDATION: This patient is receiving pantoprazole by the intravenous route.  Based on criteria approved by the Pharmacy and Therapeutics Committee, the intravenous medication(s) is/are being converted to the equivalent oral dose form(s).   DESCRIPTION: These criteria include:  The patient is eating (either orally or via tube) and/or has been taking other orally administered medications for a least 24 hours  The patient has no evidence of active gastrointestinal bleeding or impaired GI absorption (gastrectomy, short bowel, patient on TNA or NPO).  If you have questions about this conversion, please contact the [place], [facility] 01/12/2017 7:23 AM

## 2017-01-12 NOTE — Discharge Summary (Signed)
Physician Discharge Summary  Patient ID: Gary Ferrell MRN: [id number] DOB/AGE: 1935-06-24 81 y.o.  Admit date: 01/09/2017 Discharge date: 01/12/2017  Admission Diagnoses:carcinoma cecum  Discharge Diagnoses:  Active Problems:   Carcinoma of cecum [facility])   Discharged Condition: good  Hospital Course: this 81 year old male with dementia and history of lymphoma which has been treated was recently noted to have some thickening in any increased activity in the cecum on a PET scan. Subsequent colonoscopy revealed a near circumferential hard mass and biopsy confirming adenocarcinoma.Preoperative CEA was normal and on imaging was no apparent findings of metastatic disease.after discussion with the patient and his son and was decided to proceed with a formal right hemicolectomy. On 01/09/2017 the patient underwent a right hemicolectomy through a right transverse incision-this was used since patient has had multiple interventions in the midportion of the abdomen.postoperative course has been essentially uncomplicated. Patient has had minimal pain during in this hospital stay.at present he is tolerating oral intake without any difficulty and his bowels are removed. Incision remains clean and intact. 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 Ardyth Man 2- 3 weeks Consults: None  Significant Diagnostic Studies: none Treatments: surgery: RIGHT HEMICOLECTOMY  Discharge Exam: Blood pressure (!) 139/57, pulse 65, temperature 98.1 F (36.7 C), temperature source Oral, resp. rate 20, height 6' (1.829 m), weight 161 lb (73 kg), SpO2 97 %. GI: soft, non-tender; bowel sounds normal; no masses,  no organomegaly Lungs are clear.Heart in sinus rhythm Disposition: 01-Home or Self Care  Discharge Instructions    Call MD for:  persistant nausea and vomiting    Complete by:  As directed    Call MD for:   redness, tenderness, or signs of infection (pain, swelling, redness, odor or green/yellow discharge around incision site)    Complete by:  As directed    Call MD for:  severe uncontrolled pain    Complete by:  As directed    Call MD for:  temperature >100.4    Complete by:  As directed    Diet - low sodium heart healthy    Complete by:  As directed    Discharge instructions    Complete by:  As directed    May shower No exertional activity     Allergies as of 01/12/2017      Reactions   Dapsone Other (See Comments)   REACTION: caused neuropathy/ affected walking  patient stated he received high dose of dapsone which caused the neuropathy. Also stated he is not allergic to this medication but had a reaction due to the high dose he was given.       Medication List    STOP taking these medications   acetaminophen 500 MG tablet Commonly known as:  TYLENOL   neomycin 500 MG tablet Commonly known as:  MYCIFRADIN   polyethylene glycol powder powder Commonly known as:  GLYCOLAX/MIRALAX   predniSONE 50 MG tablet Commonly known as:  DELTASONE     TAKE these medications   ALOE BURN RELIEF 0.5 % Aero Generic drug:  Lidocaine Apply 1 application topically every 6 (six) hours as needed (for itch/rash.).   alum & mag hydroxide-simeth 200-200-20 MG/5ML suspension Commonly known as:  MAALOX/MYLANTA Take 30 mLs by mouth every 6 (six) hours as needed for indigestion or heartburn.   aspirin 81 MG chewable tablet Chew 81 mg by mouth daily. (0800)   aspirin EC 325 MG  tablet Take 325 mg by mouth daily as needed (for chest pain as directed).   carvedilol 3.125 MG tablet Commonly known as:  COREG TAKE 1 TABLET BY MOUTH 2 (TWO) TIMES DAILY WITH MEALS. What changed:  See the new instructions.   cholecalciferol 1000 units tablet Commonly known as:  VITAMIN D Take 1,000 Units by mouth daily. (0600)   donepezil 5 MG tablet Commonly known as:  ARICEPT Take 5 mg by mouth at bedtime.  (0600)   finasteride 5 MG tablet Commonly known as:  PROSCAR Take 5 mg by mouth daily. (0800)   loperamide 2 MG tablet Commonly known as:  IMODIUM A-D Take 2 mg by mouth 4 (four) times daily as needed for diarrhea or loose stools.   magic mouthwash w/lidocaine Soln Take 5 mLs by mouth 4 (four) times daily. What changed:  additional instructions   magnesium hydroxide 400 MG/5ML suspension Commonly known as:  MILK OF MAGNESIA Take 30 mLs by mouth daily as needed for mild constipation.   multivitamin with minerals Tabs tablet Take 1 tablet by mouth daily. (0600)   NEOPORACIN EX Apply 1 application topically every 12 (twelve) hours as needed (abrasion).   rosuvastatin 20 MG tablet Commonly known as:  CRESTOR Take 20 mg by mouth daily. (0800) Reported on 06/16/2015   SILTUSSIN DAS 100 MG/5ML liquid Generic drug:  guaiFENesin Take 200 mg by mouth 4 (four) times daily as needed for cough.   tamsulosin 0.4 MG Caps capsule Commonly known as:  FLOMAX Take 0.4 mg by mouth daily. (0800)   traMADol 50 MG tablet Commonly known as:  ULTRAM Take 1 tablet (50 mg total) by mouth every 6 (six) hours as needed (mild pain).      Follow-up Information    Christene Lye, MD Follow up in 10 day(s).   Specialties:  General Surgery, Radiology Contact information: [address] [place] [address] [phone]           Signed: Christene Lye 01/12/2017, 12:12 PM

## 2017-01-12 NOTE — NC FL2 (Signed)
[place] LEVEL OF CARE SCREENING TOOL     IDENTIFICATION  Patient Name: Gary Ferrell Birthdate: 1935-09-04 Sex: male Admission Date (Current Location): 01/09/2017  [facility] and [place] Number:  Engineering geologist and Address:  [facility], [address] [place], [place] [address]      Provider Number: [id number]  Attending Physician Name and Address:  Christene Lye, *  Relative Name and Phone Number:       Current Level of Care: Hospital Recommended Level of Care: [place] Prior Approval Number:    Date Approved/Denied:   PASRR Number:    Discharge Plan:  (ALF)    Current Diagnoses: Patient Active Problem List   Diagnosis Date Noted  . Carcinoma of cecum ([place]) 01/09/2017  . Encounter for antineoplastic immunotherapy 09/13/2016  . Portacath in place 07/13/2016  . CINV (chemotherapy-induced nausea and vomiting) 07/13/2016  . 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 Encounter for monitoring cardiotoxic drug therapy 07/12/2016  . Thrombocytopenia ([place]) 11/03/2015  . Splenomegaly 11/03/2015  . Preop cardiovascular exam 05/20/2014  . PAD (peripheral artery disease) ([place])   . Carotid disease, bilateral ([place])   . Ischemic cardiomyopathy   . Chronic systolic CHF (congestive heart failure) ([place])   . Coronary artery disease   . Hyperlipidemia   . Hypertension   . Personal history of colonic polyps 10/14/2013  . ICD (implantable cardioverter-defibrillator), biventricular, in situ 10/01/2013  . Obstructive sleep apnea 10/01/2013  . Occlusion and stenosis of carotid artery without mention of cerebral infarction 07/02/2013  . Cardiomyopathy, ischemic 06/11/2013  . Chronic systolic heart failure ([place]) 06/11/2013    Orientation RESPIRATION BLADDER Height & Weight     Self, Place, Situation, Time  Normal Continent Weight: 161 lb (73 kg) Height:  6' (182.9 cm)  BEHAVIORAL  SYMPTOMS/MOOD NEUROLOGICAL BOWEL NUTRITION STATUS   (none)  (none) Continent Diet (heart healthy)  AMBULATORY STATUS COMMUNICATION OF NEEDS Skin   Independent Verbally Surgical wounds                       Personal Care Assistance Level of Assistance  Bathing, Dressing Bathing Assistance: Limited assistance   Dressing Assistance: Limited assistance     Functional Limitations Info  Hearing   Hearing Info: Impaired      SPECIAL CARE FACTORS FREQUENCY                       Contractures Contractures Info: Not present    Additional Factors Info  Code Status, Allergies Code Status Info: full Allergies Info: dapsone           Medication List     STOP taking these medications   acetaminophen 500 MG tablet Commonly known as:  TYLENOL   neomycin 500 MG tablet Commonly known as:  MYCIFRADIN   polyethylene glycol powder powder Commonly known as:  GLYCOLAX/MIRALAX   predniSONE 50 MG tablet Commonly known as:  DELTASONE     TAKE these medications   ALOE BURN RELIEF 0.5 % Aero Generic drug:  Lidocaine Apply 1 application topically every 6 (six) hours as needed (for itch/rash.).   alum & mag hydroxide-simeth 200-200-20 MG/5ML suspension Commonly known as:  MAALOX/MYLANTA Take 30 mLs by mouth every 6 (six) hours as needed for indigestion or heartburn.   aspirin 81 MG chewable tablet Chew 81 mg by mouth daily. (0800)   aspirin EC 325 MG tablet Take  325 mg by mouth daily as needed (for chest pain as directed).   carvedilol 3.125 MG tablet Commonly known as:  COREG TAKE 1 TABLET BY MOUTH 2 (TWO) TIMES DAILY WITH MEALS. What changed:  See the new instructions.   cholecalciferol 1000 units tablet Commonly known as:  VITAMIN D Take 1,000 Units by mouth daily. (0600)   donepezil 5 MG tablet Commonly known as:  ARICEPT Take 5 mg by mouth at bedtime. (0600)   finasteride 5 MG tablet Commonly known as:  PROSCAR Take 5 mg by mouth daily.  (0800)   loperamide 2 MG tablet Commonly known as:  IMODIUM A-D Take 2 mg by mouth 4 (four) times daily as needed for diarrhea or loose stools.   magic mouthwash w/lidocaine Soln Take 5 mLs by mouth 4 (four) times daily. What changed:  additional instructions   magnesium hydroxide 400 MG/5ML suspension Commonly known as:  MILK OF MAGNESIA Take 30 mLs by mouth daily as needed for mild constipation.   multivitamin with minerals Tabs tablet Take 1 tablet by mouth daily. (0600)   NEOPORACIN EX Apply 1 application topically every 12 (twelve) hours as needed (abrasion).   rosuvastatin 20 MG tablet Commonly known as:  CRESTOR Take 20 mg by mouth daily. (0800) Reported on 06/16/2015   SILTUSSIN DAS 100 MG/5ML liquid Generic drug:  guaiFENesin Take 200 mg by mouth 4 (four) times daily as needed for cough.   tamsulosin 0.4 MG Caps capsule Commonly known as:  FLOMAX Take 0.4 mg by mouth daily. (0800)   traMADol 50 MG tablet Commonly known as:  ULTRAM Take 1 tablet (50 mg total) by mouth every 6 (six) hours as needed (mild pain).         Follow-up Information    Christene Lye, MD Follow up in 10 day(s).   Specialties:  General Surgery, Radiology Contact information: [address] [place] [address] [phone]    Relevant Lab Results:   Additional Information    Shela Leff, LCSW

## 2017-01-12 NOTE — Progress Notes (Signed)
Pt to be discharged per MD order. IV removed. Instructions reviewed with pt. Report called to [facility] facility. Care coordinator Eliezer Lofts received report. Scripts sent with pt along with discharge materials. Facility to provide transport.

## 2017-01-20 ENCOUNTER — Telehealth: Payer: Self-pay | Admitting: General Surgery

## 2017-01-20 ENCOUNTER — Inpatient Hospital Stay: Payer: Medicare Other

## 2017-01-20 ENCOUNTER — Inpatient Hospital Stay: Payer: Medicare Other | Admitting: Internal Medicine

## 2017-01-20 NOTE — Telephone Encounter (Signed)
I CALLED PATIENT'S SON JONATHON AND LEFT A MESSAGE ASKING HIM TO CALL BACK.WE NEED TO KNOW IF THE APPOINTMENT FOR 01-24-17 WITH DR Jamal Collin WAS CANCELLED IN ERROR FOR PATIENT'S P/O OR WE JUST NEED TO RESCHEDULE APPOINTMENT.

## 2017-01-23 ENCOUNTER — Other Ambulatory Visit: Payer: Self-pay | Admitting: Internal Medicine

## 2017-01-23 DIAGNOSIS — K1379 Other lesions of oral mucosa: Secondary | ICD-10-CM

## 2017-01-24 ENCOUNTER — Ambulatory Visit: Payer: Medicare Other | Admitting: General Surgery

## 2017-01-24 ENCOUNTER — Encounter: Payer: Self-pay | Admitting: General Surgery

## 2017-01-24 ENCOUNTER — Other Ambulatory Visit: Payer: Self-pay

## 2017-01-24 ENCOUNTER — Inpatient Hospital Stay (HOSPITAL_BASED_OUTPATIENT_CLINIC_OR_DEPARTMENT_OTHER): Payer: Medicare Other | Admitting: Internal Medicine

## 2017-01-24 ENCOUNTER — Ambulatory Visit (INDEPENDENT_AMBULATORY_CARE_PROVIDER_SITE_OTHER): Payer: Medicare Other | Admitting: General Surgery

## 2017-01-24 ENCOUNTER — Telehealth: Payer: Self-pay | Admitting: Internal Medicine

## 2017-01-24 ENCOUNTER — Inpatient Hospital Stay: Payer: Medicare Other | Attending: Internal Medicine

## 2017-01-24 VITALS — BP 130/64 | HR 80 | Resp 11 | Ht 72.0 in | Wt 150.0 lb

## 2017-01-24 VITALS — BP 133/76 | HR 84 | Temp 97.6°F | Resp 20 | Wt 150.0 lb

## 2017-01-24 DIAGNOSIS — D801 Nonfamilial hypogammaglobulinemia: Secondary | ICD-10-CM | POA: Insufficient documentation

## 2017-01-24 DIAGNOSIS — I252 Old myocardial infarction: Secondary | ICD-10-CM | POA: Diagnosis not present

## 2017-01-24 DIAGNOSIS — I700 Atherosclerosis of aorta: Secondary | ICD-10-CM

## 2017-01-24 DIAGNOSIS — G40909 Epilepsy, unspecified, not intractable, without status epilepticus: Secondary | ICD-10-CM

## 2017-01-24 DIAGNOSIS — I4891 Unspecified atrial fibrillation: Secondary | ICD-10-CM | POA: Diagnosis not present

## 2017-01-24 DIAGNOSIS — F0390 Unspecified dementia without behavioral disturbance: Secondary | ICD-10-CM | POA: Insufficient documentation

## 2017-01-24 DIAGNOSIS — Z951 Presence of aortocoronary bypass graft: Secondary | ICD-10-CM

## 2017-01-24 DIAGNOSIS — B370 Candidal stomatitis: Secondary | ICD-10-CM | POA: Insufficient documentation

## 2017-01-24 DIAGNOSIS — C182 Malignant neoplasm of ascending colon: Secondary | ICD-10-CM

## 2017-01-24 DIAGNOSIS — N182 Chronic kidney disease, stage 2 (mild): Secondary | ICD-10-CM | POA: Insufficient documentation

## 2017-01-24 DIAGNOSIS — G4733 Obstructive sleep apnea (adult) (pediatric): Secondary | ICD-10-CM | POA: Diagnosis not present

## 2017-01-24 DIAGNOSIS — R918 Other nonspecific abnormal finding of lung field: Secondary | ICD-10-CM

## 2017-01-24 DIAGNOSIS — I130 Hypertensive heart and chronic kidney disease with heart failure and stage 1 through stage 4 chronic kidney disease, or unspecified chronic kidney disease: Secondary | ICD-10-CM | POA: Diagnosis not present

## 2017-01-24 DIAGNOSIS — Z87891 Personal history of nicotine dependence: Secondary | ICD-10-CM | POA: Insufficient documentation

## 2017-01-24 DIAGNOSIS — Z9225 Personal history of immunosupression therapy: Secondary | ICD-10-CM

## 2017-01-24 DIAGNOSIS — Z9049 Acquired absence of other specified parts of digestive tract: Secondary | ICD-10-CM

## 2017-01-24 DIAGNOSIS — N400 Enlarged prostate without lower urinary tract symptoms: Secondary | ICD-10-CM | POA: Insufficient documentation

## 2017-01-24 DIAGNOSIS — Z801 Family history of malignant neoplasm of trachea, bronchus and lung: Secondary | ICD-10-CM | POA: Insufficient documentation

## 2017-01-24 DIAGNOSIS — I5022 Chronic systolic (congestive) heart failure: Secondary | ICD-10-CM | POA: Insufficient documentation

## 2017-01-24 DIAGNOSIS — I255 Ischemic cardiomyopathy: Secondary | ICD-10-CM | POA: Diagnosis not present

## 2017-01-24 DIAGNOSIS — E785 Hyperlipidemia, unspecified: Secondary | ICD-10-CM

## 2017-01-24 DIAGNOSIS — I2510 Atherosclerotic heart disease of native coronary artery without angina pectoris: Secondary | ICD-10-CM | POA: Insufficient documentation

## 2017-01-24 DIAGNOSIS — C180 Malignant neoplasm of cecum: Secondary | ICD-10-CM

## 2017-01-24 DIAGNOSIS — Z7982 Long term (current) use of aspirin: Secondary | ICD-10-CM | POA: Diagnosis not present

## 2017-01-24 DIAGNOSIS — C8334 Diffuse large B-cell lymphoma, lymph nodes of axilla and upper limb: Secondary | ICD-10-CM | POA: Diagnosis not present

## 2017-01-24 DIAGNOSIS — Z8673 Personal history of transient ischemic attack (TIA), and cerebral infarction without residual deficits: Secondary | ICD-10-CM

## 2017-01-24 DIAGNOSIS — Z800 Family history of malignant neoplasm of digestive organs: Secondary | ICD-10-CM | POA: Insufficient documentation

## 2017-01-24 DIAGNOSIS — D6959 Other secondary thrombocytopenia: Secondary | ICD-10-CM | POA: Insufficient documentation

## 2017-01-24 DIAGNOSIS — Z950 Presence of cardiac pacemaker: Secondary | ICD-10-CM | POA: Insufficient documentation

## 2017-01-24 DIAGNOSIS — Z7952 Long term (current) use of systemic steroids: Secondary | ICD-10-CM | POA: Insufficient documentation

## 2017-01-24 DIAGNOSIS — K219 Gastro-esophageal reflux disease without esophagitis: Secondary | ICD-10-CM | POA: Diagnosis not present

## 2017-01-24 DIAGNOSIS — Z79899 Other long term (current) drug therapy: Secondary | ICD-10-CM | POA: Insufficient documentation

## 2017-01-24 DIAGNOSIS — Z9221 Personal history of antineoplastic chemotherapy: Secondary | ICD-10-CM

## 2017-01-24 DIAGNOSIS — R161 Splenomegaly, not elsewhere classified: Secondary | ICD-10-CM

## 2017-01-24 DIAGNOSIS — C8584 Other specified types of non-Hodgkin lymphoma, lymph nodes of axilla and upper limb: Secondary | ICD-10-CM

## 2017-01-24 LAB — CBC WITH DIFFERENTIAL/PLATELET
BASOS ABS: 0 10*3/uL (ref 0–0.1)
BASOS PCT: 1 %
Eosinophils Absolute: 0 10*3/uL (ref 0–0.7)
Eosinophils Relative: 0 %
HEMATOCRIT: 38.1 % — AB (ref 40.0–52.0)
HEMOGLOBIN: 12.6 g/dL — AB (ref 13.0–18.0)
LYMPHS PCT: 26 %
Lymphs Abs: 1.3 10*3/uL (ref 1.0–3.6)
MCH: 26.7 pg (ref 26.0–34.0)
MCHC: 33 g/dL (ref 32.0–36.0)
MCV: 81 fL (ref 80.0–100.0)
MONOS PCT: 14 %
Monocytes Absolute: 0.7 10*3/uL (ref 0.2–1.0)
NEUTROS ABS: 3.1 10*3/uL (ref 1.4–6.5)
NEUTROS PCT: 59 %
Platelets: 248 10*3/uL (ref 150–440)
RBC: 4.7 MIL/uL (ref 4.40–5.90)
RDW: 15.7 % — ABNORMAL HIGH (ref 11.5–14.5)
WBC: 5.1 10*3/uL (ref 3.8–10.6)

## 2017-01-24 LAB — COMPREHENSIVE METABOLIC PANEL
ALBUMIN: 3.8 g/dL (ref 3.5–5.0)
ALK PHOS: 121 U/L (ref 38–126)
ALT: 12 U/L — ABNORMAL LOW (ref 17–63)
AST: 17 U/L (ref 15–41)
Anion gap: 9 (ref 5–15)
BILIRUBIN TOTAL: 1.4 mg/dL — AB (ref 0.3–1.2)
BUN: 12 mg/dL (ref 6–20)
CALCIUM: 8.8 mg/dL — AB (ref 8.9–10.3)
CO2: 30 mmol/L (ref 22–32)
Chloride: 99 mmol/L — ABNORMAL LOW (ref 101–111)
Creatinine, Ser: 0.74 mg/dL (ref 0.61–1.24)
GFR calc Af Amer: >60 mL/min (ref >60)
GFR calc non Af Amer: >60 mL/min (ref >60)
GLUCOSE: 77 mg/dL (ref 65–99)
Potassium: 3.6 mmol/L (ref 3.5–5.1)
Sodium: 138 mmol/L (ref 135–145)
TOTAL PROTEIN: 6.2 g/dL — AB (ref 6.5–8.1)

## 2017-01-24 LAB — LACTATE DEHYDROGENASE: LDH: 157 U/L (ref 98–192)

## 2017-01-24 MED ORDER — FLUCONAZOLE 100 MG PO TABS: 100.0000 mg | ORAL_TABLET | Freq: Every day | ORAL | 0 refills | Status: AC

## 2017-01-24 NOTE — Telephone Encounter (Signed)
Spoke to son re: his father's plan of care; please see the office note from October 16th for details.

## 2017-01-24 NOTE — Progress Notes (Signed)
Patient ID: Gary Ferrell, male   DOB: 1935/07/25, 81 y.o.   MRN: [id number]  Chief Complaint  Patient presents with  . Routine Post Op    HPI Gary Ferrell is a 81 y.o. male.  Here today for postoperative visit, right colectomy on 01-09-17, he states he feels tired and sluggish.  Appetite is fair. Denies any gastrointestinal issues, bowels are moving regular. He is at [facility].  HPI  Past Medical History:  Diagnosis Date  . AICD (automatic cardioverter/defibrillator) present   . BPH (benign prostatic hyperplasia)    a. 06/2012 s/p Greenlight Laser Prostatectomy.  . Carotid disease, bilateral ([place])    a. 1990's s/p L CEA;  b. 11/2004 s/p redo L CEA;  c. 06/2013 Carotid U/S: RICA 62-13%, LICA patent.  . Chronic systolic CHF (congestive heart failure) ([place])    a. 04/2013 Echo: EF 35%, inf DK, inflat AK, diast dysfxn, RV HK, mild MR, nmildly dil LA.  Marland Kitchen CINV (chemotherapy-induced nausea and vomiting)   . CKD (chronic kidney disease), stage II   . Coronary artery disease    a. 2007 s/p MI & CABG.  . Dementia   . Diverticulosis    a. 10/2013 colonoscopy (otw nl study).  Marland Kitchen Dyspnea   . GERD (gastroesophageal reflux disease)   . Hyperlipidemia   . Hypertension   . Ischemic cardiomyopathy    a. 04/2013 Echo: EF 35%;  b. 06/2013 s/p SJM Bi-V ICD, ser # [id number].  . Large B-cell lymphoma ([place]) 06/16/2016  . LBBB (left bundle branch block)   . Lung nodules   . Myocardial infarction ([place])   . OSA (obstructive sleep apnea)    a. 12/2013 Sleep study: severe OSA - CPAP-   . PAD (peripheral artery disease) ([place])    a. 1990's s/p Aortobifem bypass.  . Presence of permanent cardiac pacemaker   . Spinal stenosis   . Stricture of urethral meatus    a. 08/2009 s/p dil.  . Stroke [facility])    a. 2007 Periop CABG - no residual.    Past Surgical History:  Procedure Laterality Date  . AORTA - BILATERAL FEMORAL ARTERY BYPASS GRAFT  1992  . APPENDECTOMY  1964  . AXILLARY LYMPH NODE  BIOPSY Left 06/16/2016   LARGE B-CELL LYMPHOMA/ AXILLARY LYMPH NODE BIOPSY;  Surgeon: Christene Lye, MD;  Location: [facility] ORS;  Service: General;  Laterality: Left;  . BI-VENTRICULAR IMPLANTABLE CARDIOVERTER DEFIBRILLATOR N/A 06/25/2013   Procedure: BI-VENTRICULAR IMPLANTABLE CARDIOVERTER DEFIBRILLATOR  (CRT-D);  Surgeon: Evans Lance, MD;  Location: [facility] CATH LAB;  Service: Cardiovascular;  Laterality: N/A;  . BI-VENTRICULAR IMPLANTABLE CARDIOVERTER DEFIBRILLATOR  (CRT-D)  06/25/2013  . BI-VENTRICULAR IMPLANTABLE CARDIOVERTER DEFIBRILLATOR  (CRT-D)  06/25/13   STJ CRTD implanted by Dr Lovena Le  . CARDIAC CATHETERIZATION  2007; 2012   "unsuccessful attempt to put stents in in 2007, had MI, had to have CABG;"  . CAROTID ENDARTERECTOMY Left 1992; 2006  . CHOLECYSTECTOMY  ~ 1996  . COLON SURGERY  2007   herniated intestinal obstruction  . COLONOSCOPY WITH PROPOFOL N/A 12/20/2016   Procedure: COLONOSCOPY WITH PROPOFOL;  Surgeon: Christene Lye, MD;  Location: [facility] ENDOSCOPY;  Service: Endoscopy;  Laterality: N/A;  . CORONARY ARTERY BYPASS GRAFT  2007   "CABG X3" (06/25/2013)  . GREEN LIGHT LASER TURP (TRANSURETHRAL RESECTION OF PROSTATE N/A 07/02/2012   Procedure: GREEN LIGHT LASER TURP (TRANSURETHRAL RESECTION OF PROSTATE;  Surgeon: Ailene Rud, MD;  Location: [facility] ORS;  Service: Urology;  Laterality: N/A;  . PARTIAL COLECTOMY Right 01/09/2017   Procedure: RIGHT COLECTOMY;  Surgeon: Christene Lye, MD;  Location: [facility] ORS;  Service: General;  Laterality: Right;  . PORTACATH PLACEMENT N/A 07/20/2016   Procedure: INSERTION PORT-A-CATH;  Surgeon: Christene Lye, MD;  Location: [facility] ORS;  Service: General;  Laterality: N/A;  . SKIN GRAFT  2008   "area of colon surgery wouldn't heal; had this graft over area"  . TONSILLECTOMY    . URETHRAL MEATOPLASTY  08/2009  . URETHRAL STRICTURE DILATATION  02/2010; 09/2010  . VENTRAL HERNIA REPAIR  2008   ventral epigastric hernia  repair [Other]    Family History  Problem Relation Age of Onset  . Pancreatic cancer Father   . Diabetes Brother   . Heart disease Brother   . Colon cancer Maternal Grandmother   . Parkinson's disease Mother   . Lung cancer Brother        stage 4    Social History Social History  Substance Use Topics  . Smoking status: Former Smoker    Packs/day: 1.00    Years: 20.00    Types: Cigarettes    Quit date: 04/11/1989  . Smokeless tobacco: Never Used  . Alcohol use No    Allergies  Allergen Reactions  . Dapsone Other (See Comments)    REACTION: caused neuropathy/ affected walking  patient stated he received high dose of dapsone which caused the neuropathy. Also stated he is not allergic to this medication but had a reaction due to the high dose he was given.     Current Outpatient Prescriptions  Medication Sig Dispense Refill  . aspirin 81 MG chewable tablet Chew 81 mg by mouth daily. (0800)    . carvedilol (COREG) 3.125 MG tablet TAKE 1 TABLET BY MOUTH 2 (TWO) TIMES DAILY WITH MEALS. (Patient taking differently: TAKE 0.5 TABLET (1.5625 MG) BY MOUTH TWICE DAILY (0600 & 1700)) 180 tablet 1  . cholecalciferol (VITAMIN D) 1000 UNITS tablet Take 1,000 Units by mouth daily. (0600)    . donepezil (ARICEPT) 5 MG tablet Take 5 mg by mouth at bedtime. (0600)    . finasteride (PROSCAR) 5 MG tablet Take 5 mg by mouth daily. (0800)    . levETIRAcetam (KEPPRA) 1000 MG tablet Take 1,000 mg by mouth 2 (two) times daily.    . Multiple Vitamin (MULTIVITAMIN WITH MINERALS) TABS Take 1 tablet by mouth daily. (0600)    . rosuvastatin (CRESTOR) 20 MG tablet Take 20 mg by mouth daily. (0800) Reported on 06/16/2015    . tamsulosin (FLOMAX) 0.4 MG CAPS capsule Take 0.4 mg by mouth daily. (0800)     No current facility-administered medications for this visit.     Review of Systems Review of Systems  Constitutional: Positive for fatigue.  Respiratory: Negative.   Cardiovascular: Negative.    Gastrointestinal: Negative for constipation and diarrhea.  Neurological: Positive for weakness.    Blood pressure 130/64, pulse 80, resp. rate 11, height 6' (1.829 m), weight 150 lb (68 kg), SpO2 98 %.  Physical Exam Physical Exam  Constitutional: He is oriented to person, place, and time. He appears well-developed and well-nourished. He appears ill (weak with movement, appears bit more confused than usual).  Eyes: No scleral icterus.  Neck: Neck supple.  Cardiovascular: Normal rate, regular rhythm and normal heart sounds.   Pulmonary/Chest: Effort normal and breath sounds normal.  Abdominal: Soft. Bowel sounds are normal. There is no hepatomegaly. There is no tenderness.    Neurological:  He is alert and oriented to person, place, and time.  Skin: Skin is warm and dry.    Data Reviewed Prior notes reviewed  Path- CA cecum, T3, N2 Assessment Incision is clean and healing well. Appears weak and bit more confused. He is sheduled for aome labs today and also f/u with oncologist.    Plan    Patient to return in one month Appointment with Dr Rogue Bussing later today  HPI, Physical Exam, Assessment and Plan have been scribed under the direction and in the presence of Mckinley Jewel, MD Karie Fetch, RN     I have completed the exam and reviewed the above documentation for accuracy and completeness.  I agree with the above.  Haematologist has been used and any errors in dictation or transcription are unintentional.  Seeplaputhur G. Jamal Collin, M.D., F.A.C.S.    Junie Panning G 01/24/2017, 11:20 AM

## 2017-01-24 NOTE — Assessment & Plan Note (Addendum)
#  Colon cancer stage III- p T3 N2/adenocarcinoma; MMR stable. Discussed with the patient's son over the phone- that in general we typically recommend adjuvant chemotherapy for stage III colon cancers. The cure just with surgery for history of cancer- is in the order of 30%. Adjuvant chemotherapy can help reduce the risk [relative risk reduction of about 20%]. Adjuvant chemotherapy does not guarantee cure. After the lengthy discussion with the patient's son- it was decided to hold off any adjuvant chemotherapy at this time- given his advanced dementia/frailty status etc.  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 [? Transformed lymphoma]. Currently status post cycle #6  of Rituxan-prednisone; last PET scan July 2018 significant clinical response. No concerns for any progression. We'll plan to get PET scan in approximately 2 months.  # Oral thrush- recommend diflucan 100 mg/dayx 7 days. Prescription for Diflucan given.  # A. Fib-aroxysmal/rate controlled- on aspirin only.  # Dementia- progressively worse; defer to primary care for further management.  # Seizures- new onset evaluation at [facility] recently on Keppra.  # follow up with me in 2 months/PET scan prior/port flush/labs.   The above plan of care was discussed with the patient's son over the phone given the patient's advanced dementia. He agrees to the plan. Also discussed with Dr. Jamal Collin.

## 2017-01-24 NOTE — Progress Notes (Signed)
Patient transported from [facility], which did not provide a facility MAR. Unable to reconcile medications today. Patient is a poor historian.

## 2017-01-24 NOTE — Progress Notes (Signed)
[name] NOTE  Patient Care Team: Sharyne Peach, MD as PCP - General (Family Medicine) Shela Nevin, MD as Attending Physician (Cardiology) Cammie Sickle, MD as Consulting Physician (Internal Medicine) Christene Lye, MD as Surgeon (General Surgery)  CHIEF COMPLAINTS/PURPOSE OF CONSULTATION:   Oncology History   # MARCH- April 2018[facility] STAGE IV- [EBV positive; R. Ax LN; Dr.Sankar]; ? ABC subtype;POSITIVE FOR MYC GENE-RE-ARRANAGMENT.  FEB 20th 2018- PET Borderline enlarged hypermetabolic lymph nodes ] throughout the neck, chest, abdomen and pelvis, together with hypermetabolic Splenomegaly.   # April 2018- R-Pred [hx of ICMP; April 2018- EF65%]. July 2018 PET- near CR; bu ascending colon uptake  # SEP 2018- COLON CA/caecum s/p right hemi-colectomy [pT3pN2 -STAGE III; Dr.Sankar]  -------------------------------------------------------------------------------------------- # 2014- THROMBOCYTOPENIA [130s-140s] sec to splenomegaly; Feb- 2017- 107/N-Hb [hep B/C/HIV- Neg]; US-moderate splenomegaly. March 2017- BMBx- Megakaryocytosis/ otherwise no obvious dyspoiesis/ or malignancy; no increase in reticulin fibrosis; cytogenetics-n.    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 He is Unfortunately alone.  Gary Ferrell 81 y.o.  male Caucasian patient with moderate to severe dementia- is currently status post right hemicolectomy for his cecal cancer; is also status post 6 cycles of rituximab prednisone- for his diffuse large  B-cell lymphoma. Last chemotherapy was approximately 6 weeks ago.  As per the family/son- patient's memory problems are getting worse. Patient was also seen in the emergency room at [facility] approximately week ago for seizures. Patient is currently on [place].  Patient denies any symptoms. Denies any new lumps or bumps. He denies any difficulty swallowing. Given his advanced dementia- unreliable.  ROS: Difficult to assess given patient's dementia.  MEDICAL HISTORY:  Past Medical History:  Diagnosis Date  . AICD (automatic cardioverter/defibrillator) present   . BPH (benign prostatic hyperplasia)    a. 06/2012 s/p Greenlight Laser Prostatectomy.  . Carotid disease, bilateral ([place])    a. 1990's s/p L CEA;  b. 11/2004 s/p redo L CEA;  c. 06/2013 Carotid U/S: RICA 27-51%, LICA patent.  . Chronic systolic CHF (congestive heart failure) ([place])    a. 04/2013 Echo: EF 35%, inf DK, inflat AK, diast dysfxn, RV HK, mild MR, nmildly dil LA.  Marland Kitchen CINV (chemotherapy-induced nausea and vomiting)   . CKD (chronic kidney disease), stage II   . Coronary artery disease    a. 2007 s/p MI & CABG.  . Dementia   . Diverticulosis    a. 10/2013 colonoscopy (otw nl study).  Marland Kitchen Dyspnea   . GERD (gastroesophageal reflux disease)   . Hyperlipidemia   . Hypertension   . Ischemic cardiomyopathy    a. 04/2013 Echo: EF 35%;  b. 06/2013 s/p SJM Bi-V ICD, ser # [id number].  . Large B-cell lymphoma ([place]) 06/16/2016  . LBBB (left bundle branch block)   . Lung nodules   . Myocardial infarction ([place])   . OSA (obstructive sleep apnea)    a. 12/2013 Sleep study: severe OSA - CPAP-   . PAD (peripheral artery disease) ([place])    a. 1990's s/p Aortobifem bypass.  Marland Kitchen  Presence of permanent cardiac pacemaker   . Spinal stenosis   . Stricture of urethral meatus    a. 08/2009 s/p dil.  . Stroke [facility])    a. 2007 Periop CABG - no residual.    SURGICAL HISTORY: Past Surgical History:  Procedure Laterality Date  . AORTA - BILATERAL FEMORAL  ARTERY BYPASS GRAFT  1992  . APPENDECTOMY  1964  . AXILLARY LYMPH NODE BIOPSY Left 06/16/2016   LARGE B-CELL LYMPHOMA/ AXILLARY LYMPH NODE BIOPSY;  Surgeon: Christene Lye, MD;  Location: [facility] ORS;  Service: General;  Laterality: Left;  . BI-VENTRICULAR IMPLANTABLE CARDIOVERTER DEFIBRILLATOR N/A 06/25/2013   Procedure: BI-VENTRICULAR IMPLANTABLE CARDIOVERTER DEFIBRILLATOR  (CRT-D);  Surgeon: Evans Lance, MD;  Location: [facility] CATH LAB;  Service: Cardiovascular;  Laterality: N/A;  . BI-VENTRICULAR IMPLANTABLE CARDIOVERTER DEFIBRILLATOR  (CRT-D)  06/25/2013  . BI-VENTRICULAR IMPLANTABLE CARDIOVERTER DEFIBRILLATOR  (CRT-D)  06/25/13   STJ CRTD implanted by Dr Lovena Le  . CARDIAC CATHETERIZATION  2007; 2012   "unsuccessful attempt to put stents in in 2007, had MI, had to have CABG;"  . CAROTID ENDARTERECTOMY Left 1992; 2006  . CHOLECYSTECTOMY  ~ 1996  . COLON SURGERY  2007   herniated intestinal obstruction  . COLONOSCOPY WITH PROPOFOL N/A 12/20/2016   Procedure: COLONOSCOPY WITH PROPOFOL;  Surgeon: Christene Lye, MD;  Location: [facility] ENDOSCOPY;  Service: Endoscopy;  Laterality: N/A;  . CORONARY ARTERY BYPASS GRAFT  2007   "CABG X3" (06/25/2013)  . GREEN LIGHT LASER TURP (TRANSURETHRAL RESECTION OF PROSTATE N/A 07/02/2012   Procedure: GREEN LIGHT LASER TURP (TRANSURETHRAL RESECTION OF PROSTATE;  Surgeon: Ailene Rud, MD;  Location: [facility] ORS;  Service: Urology;  Laterality: N/A;  . PARTIAL COLECTOMY Right 01/09/2017   Procedure: RIGHT COLECTOMY;  Surgeon: Christene Lye, MD;  Location: [facility] ORS;  Service: General;  Laterality: Right;  . PORTACATH PLACEMENT N/A 07/20/2016   Procedure: INSERTION PORT-A-CATH;  Surgeon: Christene Lye, MD;  Location: [facility] ORS;  Service: General;  Laterality: N/A;  . SKIN GRAFT  2008   "area of colon surgery wouldn't heal; had this graft over area"  . TONSILLECTOMY    . URETHRAL MEATOPLASTY  08/2009  . URETHRAL STRICTURE DILATATION  02/2010;  09/2010  . VENTRAL HERNIA REPAIR  2008   ventral epigastric hernia repair [Other]    SOCIAL HISTORY:  Patient currently lives at [facility] in [place]. Quit smoking 1992;  No alcohol. .  Retired Medical illustrator.  Social History   Social History  . Marital status: Married    Spouse name: N/A  . Number of children: 1  . Years of education: N/A   Occupational History  . Retired    Social History Main Topics  . Smoking status: Former Smoker    Packs/day: 1.00    Years: 20.00    Types: Cigarettes    Quit date: 04/11/1989  . Smokeless tobacco: Never Used  . Alcohol use No  . Drug use: No  . Sexual activity: No   Other Topics Concern  . Not on file   Social History Narrative  . No narrative on file    FAMILY HISTORY: Family History  Problem Relation Age of Onset  . Pancreatic cancer Father   . Diabetes Brother   . Heart disease Brother   . Colon cancer Maternal Grandmother   . Parkinson's disease Mother   . Lung cancer Brother        stage 4    ALLERGIES:  is allergic  to dapsone.  MEDICATIONS:  Current Outpatient Prescriptions  Medication Sig Dispense Refill  . aspirin 81 MG chewable tablet Chew 81 mg by mouth daily. (0800)    . carvedilol (COREG) 3.125 MG tablet TAKE 1 TABLET BY MOUTH 2 (TWO) TIMES DAILY WITH MEALS. (Patient taking differently: TAKE 0.5 TABLET (1.5625 MG) BY MOUTH TWICE DAILY (0600 & 1700)) 180 tablet 1  . cholecalciferol (VITAMIN D) 1000 UNITS tablet Take 1,000 Units by mouth daily. (0600)    . donepezil (ARICEPT) 5 MG tablet Take 5 mg by mouth at bedtime. (0600)    . finasteride (PROSCAR) 5 MG tablet Take 5 mg by mouth daily. (0800)    . fluconazole (DIFLUCAN) 100 MG tablet Take 1 tablet (100 mg total) by mouth daily. 7 tablet 0  . levETIRAcetam (KEPPRA) 1000 MG tablet Take 1,000 mg by mouth 2 (two) times daily.    . Multiple Vitamin (MULTIVITAMIN WITH MINERALS) TABS Take 1 tablet by mouth daily. (0600)    . rosuvastatin  (CRESTOR) 20 MG tablet Take 20 mg by mouth daily. (0800) Reported on 06/16/2015    . tamsulosin (FLOMAX) 0.4 MG CAPS capsule Take 0.4 mg by mouth daily. (0800)     No current facility-administered medications for this visit.       Marland Kitchen  PHYSICAL EXAMINATION: ECOG PERFORMANCE STATUS: 0 - Asymptomatic  Vitals:   01/24/17 1229  BP: 133/76  Pulse: 84  Resp: 20  Temp: 97.6 F (36.4 C)   Filed Weights   01/24/17 1229  Weight: 150 lb (68 kg)     GENERAL: Well-nourished well-developed; Alert, no distress and comfortable.  He is alone. Patient is in a wheel chair.  EYES: no pallor or icterus OROPHARYNX: no ulceration; good dentition. POSITIVE for thrush.  NECK: supple, no masses felt LYMPH:  no palpable lymphadenopathy in the cervical,or inguinal regions; LN excision noted  LUNGS: clear to auscultation and  No wheeze or crackles HEART/CVS: regular rate & rhythm and no murmurs; No lower extremity edema ABDOMEN: abdomen soft, non-tender and normal bowel sounds; positive for splenomegaly.  Musculoskeletal:no cyanosis of digits and no clubbing  PSYCH: alert & oriented x 1; Flat affect. NEURO: no focal motor/sensory deficits SKIN: Skin rash completely resolved.      LABORATORY DATA:  I have reviewed the data as listed Lab Results  Component Value Date   WBC 5.1 01/24/2017   HGB 12.6 (L) 01/24/2017   HCT 38.1 (L) 01/24/2017   MCV 81.0 01/24/2017   PLT 248 01/24/2017    Recent Labs  11/14/16 0828 12/09/16 0815 01/09/17 1318 01/10/17 0412 01/24/17 1216  NA 136 137  --  135 138  K 4.1 3.9  --  4.2 3.6  CL 102 103  --  105 99*  CO2 27 29  --  25 30  GLUCOSE 129* 189*  --  205* 77  BUN 15 13  --  11 12  CREATININE 0.66 0.74 0.87 0.76 0.74  CALCIUM 8.8* 8.8*  --  8.3* 8.8*  GFRNONAA >60 >60 >60 >60 >60  GFRAA >60 >60 >60 >60 >60  PROT 5.1* 5.2*  --   --  6.2*  ALBUMIN 3.5 3.6  --   --  3.8  AST 18 22  --   --  17  ALT 15* 16*  --   --  12*  ALKPHOS 94 99  --   --  121   BILITOT 1.3* 1.1  --   --  1.4*  IMPRESSION: 1. Marked improvement and in most regions complete resolution of the prior adenopathy in the neck, chest, abdomen, and pelvis. Most of these regions are [place] 1. A subcarinal lymph node is Deauville 3 but is now normal in size. 2. Hypermetabolic activity in the cecum is present. Whereas on the prior exam this may have resembled physiologic activity, on today' s follow up PET this exact region appears to persist, and there is some increasing conspicuity of wall thickening along the cecum raising concern for colon cancer. Colonoscopy with special attention to the cecum is recommended. 3. Scattered lung nodules and regions of what appear to be rounded atelectasis in both lungs, mostly new compared to the prior exam, some of the nodularity might be attributable to atypical infectious process. I am skeptical that this is due to malignancy given the very minimal metabolic activity, but surveillance is likely warranted. 4. Mild splenomegaly but the hypermetabolic activity diffusely in the spleen on the prior exam has resolved. 5. Aortic Atherosclerosis (ICD10-I70.0). Coronary atherosclerosis with mild cardiomegaly. 6. Pleural calcifications on the right, with small right pleural effusion. 7.  Prominent stool throughout the colon favors constipation. 8. New superior endplate compression fracture at L4, likely benign.   Electronically Signed   By: Van Clines M.D.   On: 10/20/2016 10:09   RADIOGRAPHIC STUDIES: I have personally reviewed the radiological images as listed and agreed with the findings in the report. No results found.  ASSESSMENT & PLAN:   Carcinoma of cecum ([place]) # Colon cancer stage III- p T3 N2/adenocarcinoma; MMR stable. Discussed with the patient's son over the phone- that in general we typically recommend adjuvant chemotherapy for stage III colon cancers. The cure just with surgery for history of  cancer- is in the order of 30%. Adjuvant chemotherapy can help reduce the risk [relative risk reduction of about 20%]. Adjuvant chemotherapy does not guarantee cure. After the lengthy discussion with the patient's son- it was decided to hold off any adjuvant chemotherapy at this time- given his advanced dementia/frailty status etc.  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 [? Transformed lymphoma]. Currently status post cycle #6  of Rituxan-prednisone; last PET scan July 2018 significant clinical response. No concerns for any progression. We'll plan to get PET scan in approximately 2 months.  # Oral thrush- recommend diflucan 100 mg/dayx 7 days. Prescription for Diflucan given.  # A. Fib-aroxysmal/rate controlled- on aspirin only.  # Dementia- progressively worse; defer to primary care for further management.  # Seizures- new onset evaluation at [facility] recently on Keppra.  # follow up with me in 2 months/PET scan prior/port flush/labs.   The above plan of care was discussed with the patient's son over the phone given the patient's advanced dementia. He agrees to the plan. Also discussed with Dr. Jamal Collin.      Cammie Sickle, MD 01/26/2017 4:49 PM

## 2017-02-27 ENCOUNTER — Ambulatory Visit (INDEPENDENT_AMBULATORY_CARE_PROVIDER_SITE_OTHER): Payer: Medicare Other | Admitting: General Surgery

## 2017-02-27 ENCOUNTER — Encounter: Payer: Self-pay | Admitting: General Surgery

## 2017-02-27 VITALS — Ht 72.0 in | Wt 150.0 lb

## 2017-02-27 DIAGNOSIS — C180 Malignant neoplasm of cecum: Secondary | ICD-10-CM

## 2017-02-27 NOTE — Patient Instructions (Signed)
Patient to return as needed. The patient is aware to call back for any questions or concerns. 

## 2017-02-27 NOTE — Progress Notes (Signed)
Patient ID: Gary Ferrell, male   DOB: Aug 09, 1935, 81 y.o.   MRN: [id number]  Chief Complaint  Patient presents with  . Follow-up    HPI Gary Ferrell is a 81 y.o. male here today for his one month follow up right hemicolectomy done on 01/24/2017. Patient is doing better.  HPI  Past Medical History:  Diagnosis Date  . AICD (automatic cardioverter/defibrillator) present   . BPH (benign prostatic hyperplasia)    a. 06/2012 s/p Greenlight Laser Prostatectomy.  . Carotid disease, bilateral ([place])    a. 1990's s/p L CEA;  b. 11/2004 s/p redo L CEA;  c. 06/2013 Carotid U/S: RICA 16-07%, LICA patent.  . Chronic systolic CHF (congestive heart failure) ([place])    a. 04/2013 Echo: EF 35%, inf DK, inflat AK, diast dysfxn, RV HK, mild MR, nmildly dil LA.  Marland Kitchen CINV (chemotherapy-induced nausea and vomiting)   . CKD (chronic kidney disease), stage II   . Coronary artery disease    a. 2007 s/p MI & CABG.  . Dementia   . Diverticulosis    a. 10/2013 colonoscopy (otw nl study).  Marland Kitchen Dyspnea   . GERD (gastroesophageal reflux disease)   . Hyperlipidemia   . Hypertension   . Ischemic cardiomyopathy    a. 04/2013 Echo: EF 35%;  b. 06/2013 s/p SJM Bi-V ICD, ser # [id number].  . Large B-cell lymphoma ([place]) 06/16/2016  . LBBB (left bundle branch block)   . Lung nodules   . Myocardial infarction ([place])   . OSA (obstructive sleep apnea)    a. 12/2013 Sleep study: severe OSA - CPAP-   . PAD (peripheral artery disease) ([place])    a. 1990's s/p Aortobifem bypass.  . Presence of permanent cardiac pacemaker   . Spinal stenosis   . Stricture of urethral meatus    a. 08/2009 s/p dil.  . Stroke [facility])    a. 2007 Periop CABG - no residual.    Past Surgical History:  Procedure Laterality Date  . AORTA - BILATERAL FEMORAL ARTERY BYPASS GRAFT  1992  . APPENDECTOMY  1964  . AXILLARY LYMPH NODE BIOPSY Left 06/16/2016   Performed by [facility]ene Lye, MD at [facility] ORS  . BI-VENTRICULAR IMPLANTABLE CARDIOVERTER  DEFIBRILLATOR  (CRT-D)  06/25/2013  . BI-VENTRICULAR IMPLANTABLE CARDIOVERTER DEFIBRILLATOR  (CRT-D)  06/25/13   STJ CRTD implanted by Dr Lovena Le  . BI-VENTRICULAR IMPLANTABLE CARDIOVERTER DEFIBRILLATOR  (CRT-D) N/A 06/25/2013   Performed by Evans Lance, MD at [facility] CATH LAB  . CARDIAC CATHETERIZATION  2007; 2012   "unsuccessful attempt to put stents in in 2007, had MI, had to have CABG;"  . CAROTID ENDARTERECTOMY Left 1992; 2006  . CHOLECYSTECTOMY  ~ 1996  . COLON SURGERY  2007   herniated intestinal obstruction  . COLONOSCOPY WITH PROPOFOL N/A 12/20/2016   Performed by Christene Lye, MD at [place]  . CORONARY ARTERY BYPASS GRAFT  2007   "CABG X3" (06/25/2013)  . GREEN LIGHT LASER TURP (TRANSURETHRAL RESECTION OF PROSTATE N/A 07/02/2012   Performed by Ailene Rud, MD at [facility] ORS  . INSERTION PORT-A-CATH N/A 07/20/2016   Performed by Christene Lye, MD at [facility] ORS  . RIGHT COLECTOMY Right 01/09/2017   Performed by Christene Lye, MD at [facility] ORS  . SKIN GRAFT  2008   "area of colon surgery wouldn't heal; had this graft over area"  . TONSILLECTOMY    . URETHRAL MEATOPLASTY  08/2009  . URETHRAL STRICTURE DILATATION  02/2010; 09/2010  . VENTRAL HERNIA REPAIR  2008   ventral epigastric hernia repair [Other]    Family History  Problem Relation Age of Onset  . Pancreatic cancer Father   . Diabetes Brother   . Heart disease Brother   . Colon cancer Maternal Grandmother   . Parkinson's disease Mother   . Lung cancer Brother        stage 4    Social History Social History   Tobacco Use  . Smoking status: Former Smoker    Packs/day: 1.00    Years: 20.00    Pack years: 20.00    Types: Cigarettes    Last attempt to quit: 04/11/1989    Years since quitting: 27.9  . Smokeless tobacco: Never Used  Substance Use Topics  . Alcohol use: No    Alcohol/week: 0.0 oz  . Drug use: No    Allergies  Allergen Reactions  . Dapsone Other (See Comments)     REACTION: caused neuropathy/ affected walking  patient stated he received high dose of dapsone which caused the neuropathy. Also stated he is not allergic to this medication but had a reaction due to the high dose he was given.     Current Outpatient Medications  Medication Sig Dispense Refill  . aspirin 81 MG chewable tablet Chew 81 mg by mouth daily. (0800)    . carvedilol (COREG) 3.125 MG tablet TAKE 1 TABLET BY MOUTH 2 (TWO) TIMES DAILY WITH MEALS. (Patient taking differently: TAKE 0.5 TABLET (1.5625 MG) BY MOUTH TWICE DAILY (0600 & 1700)) 180 tablet 1  . cholecalciferol (VITAMIN D) 1000 UNITS tablet Take 1,000 Units by mouth daily. (0600)    . donepezil (ARICEPT) 5 MG tablet Take 5 mg by mouth at bedtime. (0600)    . finasteride (PROSCAR) 5 MG tablet Take 5 mg by mouth daily. (0800)    . fluconazole (DIFLUCAN) 100 MG tablet Take 1 tablet (100 mg total) by mouth daily. 7 tablet 0  . levETIRAcetam (KEPPRA) 1000 MG tablet Take 1,000 mg by mouth 2 (two) times daily.    . Multiple Vitamin (MULTIVITAMIN WITH MINERALS) TABS Take 1 tablet by mouth daily. (0600)    . rosuvastatin (CRESTOR) 20 MG tablet Take 20 mg by mouth daily. (0800) Reported on 06/16/2015    . tamsulosin (FLOMAX) 0.4 MG CAPS capsule Take 0.4 mg by mouth daily. (0800)     No current facility-administered medications for this visit.     Review of Systems Review of Systems  HENT: Negative for mouth sores.   Respiratory: Negative.   Cardiovascular: Negative.   Gastrointestinal: Negative.     Height 6' (1.829 m), weight 150 lb (68 kg).  Physical Exam Physical Exam  Constitutional: He is oriented to person, place, and time. He appears well-developed and well-nourished.  Eyes: Conjunctivae are normal. No scleral icterus.  Abdominal: Soft. Bowel sounds are normal. There is no tenderness.    Neurological: He is alert and oriented to person, place, and time.  Skin: Skin is warm and dry.    Data Reviewed Prior notes and  op note reviewed   Assessment    S/p right hemicolectomy 01/09/17. Incision clean and well healed. Exam stable.     Plan    Follow up with Dr. Rogue Bussing.   Patient to return as needed. The patient is aware to call back for any questions or concerns.  HPI, Physical Exam, Assessment and Plan have been scribed under the direction and in the presence of S.  Robinette Haines, MD  Gaspar Cola, CMA       I have completed the exam and reviewed the above documentation for accuracy and completeness.  I agree with the above.  Haematologist has been used and any errors in dictation or transcription are unintentional.  Seeplaputhur G. Jamal Collin, M.D., F.A.C.S.  Junie Panning G 02/27/2017, 12:59 PM

## 2017-02-28 ENCOUNTER — Ambulatory Visit
Admission: RE | Admit: 2017-02-28 | Discharge: 2017-02-28 | Disposition: A | Discharge status: Home / Self Care | Payer: Medicare Other | Source: Ambulatory Visit | Attending: Internal Medicine | Admitting: Internal Medicine

## 2017-02-28 DIAGNOSIS — R161 Splenomegaly, not elsewhere classified: Secondary | ICD-10-CM | POA: Insufficient documentation

## 2017-02-28 DIAGNOSIS — Z8572 Personal history of non-Hodgkin lymphomas: Secondary | ICD-10-CM | POA: Insufficient documentation

## 2017-02-28 DIAGNOSIS — J9811 Atelectasis: Secondary | ICD-10-CM | POA: Diagnosis not present

## 2017-02-28 DIAGNOSIS — I2510 Atherosclerotic heart disease of native coronary artery without angina pectoris: Secondary | ICD-10-CM | POA: Insufficient documentation

## 2017-02-28 DIAGNOSIS — I700 Atherosclerosis of aorta: Secondary | ICD-10-CM | POA: Diagnosis not present

## 2017-02-28 DIAGNOSIS — K769 Liver disease, unspecified: Secondary | ICD-10-CM | POA: Insufficient documentation

## 2017-02-28 DIAGNOSIS — R918 Other nonspecific abnormal finding of lung field: Secondary | ICD-10-CM | POA: Diagnosis not present

## 2017-02-28 DIAGNOSIS — C180 Malignant neoplasm of cecum: Secondary | ICD-10-CM | POA: Diagnosis present

## 2017-02-28 DIAGNOSIS — I517 Cardiomegaly: Secondary | ICD-10-CM | POA: Insufficient documentation

## 2017-02-28 DIAGNOSIS — C182 Malignant neoplasm of ascending colon: Secondary | ICD-10-CM

## 2017-02-28 DIAGNOSIS — Z9049 Acquired absence of other specified parts of digestive tract: Secondary | ICD-10-CM | POA: Diagnosis not present

## 2017-02-28 LAB — GLUCOSE, CAPILLARY: GLUCOSE-CAPILLARY: 109 mg/dL — AB (ref 65–99)

## 2017-02-28 MED ORDER — FLUDEOXYGLUCOSE F - 18 (FDG) INJECTION
12.8400 | Freq: Once | INTRAVENOUS | Status: AC | PRN
  Administered 2017-02-28: 12.84 via INTRAVENOUS

## 2017-03-06 ENCOUNTER — Telehealth: Payer: Self-pay | Admitting: Internal Medicine

## 2017-03-06 NOTE — Telephone Encounter (Signed)
FYI- Left message for pt's son to give a call re: results of the recent PET scan. Dr.B

## 2017-03-21 ENCOUNTER — Ambulatory Visit: Payer: Medicare Other | Admitting: Internal Medicine

## 2017-03-21 ENCOUNTER — Inpatient Hospital Stay: Payer: Medicare Other

## 2017-03-28 ENCOUNTER — Inpatient Hospital Stay: Payer: Medicare Other

## 2017-03-28 ENCOUNTER — Inpatient Hospital Stay: Payer: Medicare Other | Admitting: Internal Medicine

## 2017-03-29 ENCOUNTER — Inpatient Hospital Stay: Payer: Medicare Other | Attending: Internal Medicine | Admitting: Internal Medicine

## 2017-03-29 ENCOUNTER — Encounter: Payer: Self-pay | Admitting: Internal Medicine

## 2017-03-29 ENCOUNTER — Inpatient Hospital Stay: Payer: Medicare Other

## 2017-03-29 ENCOUNTER — Other Ambulatory Visit: Payer: Self-pay

## 2017-03-29 VITALS — BP 149/66 | HR 62 | Temp 97.8°F | Resp 18 | Ht 72.0 in | Wt 154.0 lb

## 2017-03-29 DIAGNOSIS — C180 Malignant neoplasm of cecum: Secondary | ICD-10-CM | POA: Diagnosis not present

## 2017-03-29 DIAGNOSIS — I2510 Atherosclerotic heart disease of native coronary artery without angina pectoris: Secondary | ICD-10-CM | POA: Diagnosis not present

## 2017-03-29 DIAGNOSIS — G40909 Epilepsy, unspecified, not intractable, without status epilepticus: Secondary | ICD-10-CM

## 2017-03-29 DIAGNOSIS — Z95810 Presence of automatic (implantable) cardiac defibrillator: Secondary | ICD-10-CM | POA: Diagnosis not present

## 2017-03-29 DIAGNOSIS — Z79899 Other long term (current) drug therapy: Secondary | ICD-10-CM

## 2017-03-29 DIAGNOSIS — Z87891 Personal history of nicotine dependence: Secondary | ICD-10-CM

## 2017-03-29 DIAGNOSIS — N400 Enlarged prostate without lower urinary tract symptoms: Secondary | ICD-10-CM | POA: Diagnosis not present

## 2017-03-29 DIAGNOSIS — R161 Splenomegaly, not elsewhere classified: Secondary | ICD-10-CM

## 2017-03-29 DIAGNOSIS — F0390 Unspecified dementia without behavioral disturbance: Secondary | ICD-10-CM

## 2017-03-29 DIAGNOSIS — Z9049 Acquired absence of other specified parts of digestive tract: Secondary | ICD-10-CM | POA: Diagnosis not present

## 2017-03-29 DIAGNOSIS — I739 Peripheral vascular disease, unspecified: Secondary | ICD-10-CM | POA: Diagnosis not present

## 2017-03-29 DIAGNOSIS — Z7982 Long term (current) use of aspirin: Secondary | ICD-10-CM

## 2017-03-29 DIAGNOSIS — Z951 Presence of aortocoronary bypass graft: Secondary | ICD-10-CM | POA: Diagnosis not present

## 2017-03-29 DIAGNOSIS — Z800 Family history of malignant neoplasm of digestive organs: Secondary | ICD-10-CM

## 2017-03-29 DIAGNOSIS — Z801 Family history of malignant neoplasm of trachea, bronchus and lung: Secondary | ICD-10-CM

## 2017-03-29 DIAGNOSIS — I5022 Chronic systolic (congestive) heart failure: Secondary | ICD-10-CM

## 2017-03-29 DIAGNOSIS — Z950 Presence of cardiac pacemaker: Secondary | ICD-10-CM | POA: Diagnosis not present

## 2017-03-29 DIAGNOSIS — G4733 Obstructive sleep apnea (adult) (pediatric): Secondary | ICD-10-CM | POA: Diagnosis not present

## 2017-03-29 DIAGNOSIS — C8334 Diffuse large B-cell lymphoma, lymph nodes of axilla and upper limb: Secondary | ICD-10-CM | POA: Diagnosis not present

## 2017-03-29 DIAGNOSIS — Z8673 Personal history of transient ischemic attack (TIA), and cerebral infarction without residual deficits: Secondary | ICD-10-CM

## 2017-03-29 DIAGNOSIS — I700 Atherosclerosis of aorta: Secondary | ICD-10-CM

## 2017-03-29 DIAGNOSIS — I4891 Unspecified atrial fibrillation: Secondary | ICD-10-CM

## 2017-03-29 DIAGNOSIS — I130 Hypertensive heart and chronic kidney disease with heart failure and stage 1 through stage 4 chronic kidney disease, or unspecified chronic kidney disease: Secondary | ICD-10-CM

## 2017-03-29 DIAGNOSIS — I252 Old myocardial infarction: Secondary | ICD-10-CM

## 2017-03-29 DIAGNOSIS — N182 Chronic kidney disease, stage 2 (mild): Secondary | ICD-10-CM

## 2017-03-29 DIAGNOSIS — E785 Hyperlipidemia, unspecified: Secondary | ICD-10-CM

## 2017-03-29 DIAGNOSIS — C182 Malignant neoplasm of ascending colon: Secondary | ICD-10-CM

## 2017-03-29 DIAGNOSIS — C8584 Other specified types of non-Hodgkin lymphoma, lymph nodes of axilla and upper limb: Secondary | ICD-10-CM

## 2017-03-29 DIAGNOSIS — K219 Gastro-esophageal reflux disease without esophagitis: Secondary | ICD-10-CM | POA: Diagnosis not present

## 2017-03-29 DIAGNOSIS — Z95828 Presence of other vascular implants and grafts: Secondary | ICD-10-CM

## 2017-03-29 DIAGNOSIS — R918 Other nonspecific abnormal finding of lung field: Secondary | ICD-10-CM | POA: Diagnosis not present

## 2017-03-29 LAB — CBC WITH DIFFERENTIAL/PLATELET
BASOS ABS: 0 10*3/uL (ref 0–0.1)
Basophils Relative: 1 %
EOS ABS: 0 10*3/uL (ref 0–0.7)
Eosinophils Relative: 0 %
HCT: 36.4 % — ABNORMAL LOW (ref 40.0–52.0)
Hemoglobin: 11.7 g/dL — ABNORMAL LOW (ref 13.0–18.0)
Lymphocytes Relative: 30 %
Lymphs Abs: 1.4 10*3/uL (ref 1.0–3.6)
MCH: 25.5 pg — ABNORMAL LOW (ref 26.0–34.0)
MCHC: 32.3 g/dL (ref 32.0–36.0)
MCV: 78.9 fL — ABNORMAL LOW (ref 80.0–100.0)
Monocytes Absolute: 0.6 10*3/uL (ref 0.2–1.0)
Monocytes Relative: 12 %
NEUTROS PCT: 57 %
Neutro Abs: 2.7 10*3/uL (ref 1.4–6.5)
PLATELETS: 147 10*3/uL — AB (ref 150–440)
RBC: 4.61 MIL/uL (ref 4.40–5.90)
RDW: 15.2 % — ABNORMAL HIGH (ref 11.5–14.5)
WBC: 4.7 10*3/uL (ref 3.8–10.6)

## 2017-03-29 LAB — COMPREHENSIVE METABOLIC PANEL
ALT: 17 U/L (ref 17–63)
AST: 22 U/L (ref 15–41)
Albumin: 3.4 g/dL — ABNORMAL LOW (ref 3.5–5.0)
Alkaline Phosphatase: 99 U/L (ref 38–126)
Anion gap: 8 (ref 5–15)
BILIRUBIN TOTAL: 1.2 mg/dL (ref 0.3–1.2)
BUN: 12 mg/dL (ref 6–20)
CO2: 26 mmol/L (ref 22–32)
CREATININE: 0.77 mg/dL (ref 0.61–1.24)
Calcium: 8.5 mg/dL — ABNORMAL LOW (ref 8.9–10.3)
Chloride: 103 mmol/L (ref 101–111)
Glucose, Bld: 149 mg/dL — ABNORMAL HIGH (ref 65–99)
POTASSIUM: 3.9 mmol/L (ref 3.5–5.1)
Sodium: 137 mmol/L (ref 135–145)
TOTAL PROTEIN: 5.2 g/dL — AB (ref 6.5–8.1)

## 2017-03-29 LAB — LACTATE DEHYDROGENASE: LDH: 121 U/L (ref 98–192)

## 2017-03-29 MED ORDER — HEPARIN SOD (PORK) LOCK FLUSH 100 UNIT/ML IV SOLN
500.0000 [IU] | INTRAVENOUS | Status: AC | PRN
  Administered 2017-03-29: 500 [IU]

## 2017-03-29 MED ORDER — SODIUM CHLORIDE 0.9% FLUSH
10.0000 mL | INTRAVENOUS | Status: AC | PRN
Start: 2017-03-29 — End: 2017-03-29
  Administered 2017-03-29: 10 mL
  Filled 2017-03-29: qty 10

## 2017-03-29 NOTE — Progress Notes (Signed)
[name] NOTE  Patient Care Team: Sharyne Peach, MD as PCP - General (Family Medicine) Shela Nevin, MD as Attending Physician (Cardiology) Cammie Sickle, MD as Consulting Physician (Internal Medicine) Christene Lye, MD as Surgeon (General Surgery)  CHIEF COMPLAINTS/PURPOSE OF CONSULTATION:   Oncology History   # MARCH- April 2018[facility] STAGE IV- [EBV positive; R. Ax LN; Dr.Sankar]; ? ABC subtype;POSITIVE FOR MYC GENE-RE-ARRANAGMENT.  FEB 20th 2018- PET Borderline enlarged hypermetabolic lymph nodes ] throughout the neck, chest, abdomen and pelvis, together with hypermetabolic Splenomegaly.   # April 2018- R-Pred [hx of ICMP; April 2018- EF65%]. July 2018 PET- near CR; bu ascending colon uptake  # SEP 2018- COLON CA/caecum s/p right hemi-colectomy [pT3pN2 -STAGE III; Dr.Sankar]  -------------------------------------------------------------------------------------------- # 2014- THROMBOCYTOPENIA [130s-140s] sec to splenomegaly; Feb- 2017- 107/N-Hb [hep B/C/HIV- Neg]; US-moderate splenomegaly. March 2017- BMBx- Megakaryocytosis/ otherwise no obvious dyspoiesis/ or malignancy; no increase in reticulin fibrosis; cytogenetics-n.    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 He is accompanied by his son.   Gary Ferrell 81 y.o.  male Caucasian patient with moderate to severe dementia- is currently status post right hemicolectomy for his cecal cancer; is also status post 6 cycles of rituximab prednisone- for his diffuse large  B-cell lymphoma- review the results of PET scan.  As per the family/son- patient's memory problems are getting worse.  He has just returned from a visit with his neurologist.  Patient denies any symptoms. Denies any new lumps or bumps. He denies any difficulty swallowing. Given his advanced dementia-history is quite unreliable.   ROS: Difficult to assess given patient's dementia.  MEDICAL HISTORY:  Past Medical History:  Diagnosis Date  . AICD (automatic cardioverter/defibrillator) present   . BPH (benign prostatic hyperplasia)    a. 06/2012 s/p Greenlight Laser Prostatectomy.  . Carotid disease, bilateral ([place])    a. 1990's s/p L CEA;  b. 11/2004 s/p redo L CEA;  c. 06/2013 Carotid U/S: RICA 43-15%, LICA patent.  . Chronic systolic CHF (congestive heart failure) ([place])    a. 04/2013 Echo: EF 35%, inf DK, inflat AK, diast dysfxn, RV HK, mild MR, nmildly dil LA.  Marland Kitchen CINV (chemotherapy-induced nausea and vomiting)   . CKD (chronic kidney disease), stage II   . Coronary artery disease    a. 2007 s/p MI & CABG.  . Dementia   . Diverticulosis    a. 10/2013 colonoscopy (otw nl study).  Marland Kitchen Dyspnea   . GERD (gastroesophageal reflux disease)   . Hyperlipidemia   . Hypertension   . Ischemic cardiomyopathy    a. 04/2013 Echo: EF 35%;  b. 06/2013 s/p SJM Bi-V ICD, ser # [id number].  . Large B-cell lymphoma ([place]) 06/16/2016  . LBBB (left bundle branch block)   . Lung nodules   . Myocardial infarction ([place])   . OSA (obstructive sleep apnea)    a. 12/2013 Sleep study: severe OSA - CPAP-   . PAD (peripheral artery disease) ([place])    a. 1990's s/p Aortobifem bypass.  . Presence of  permanent cardiac pacemaker   . Spinal stenosis   . Stricture of urethral meatus    a. 08/2009 s/p dil.  . Stroke [facility])    a. 2007 Periop CABG - no residual.    SURGICAL HISTORY: Past Surgical History:  Procedure Laterality Date  . AORTA - BILATERAL FEMORAL ARTERY BYPASS GRAFT  1992  . APPENDECTOMY  1964  . AXILLARY  LYMPH NODE BIOPSY Left 06/16/2016   LARGE B-CELL LYMPHOMA/ AXILLARY LYMPH NODE BIOPSY;  Surgeon: Christene Lye, MD;  Location: [facility] ORS;  Service: General;  Laterality: Left;  . BI-VENTRICULAR IMPLANTABLE CARDIOVERTER DEFIBRILLATOR N/A 06/25/2013   Procedure: BI-VENTRICULAR IMPLANTABLE CARDIOVERTER DEFIBRILLATOR  (CRT-D);  Surgeon: Evans Lance, MD;  Location: [facility] CATH LAB;  Service: Cardiovascular;  Laterality: N/A;  . BI-VENTRICULAR IMPLANTABLE CARDIOVERTER DEFIBRILLATOR  (CRT-D)  06/25/2013  . BI-VENTRICULAR IMPLANTABLE CARDIOVERTER DEFIBRILLATOR  (CRT-D)  06/25/13   STJ CRTD implanted by Dr Lovena Le  . CARDIAC CATHETERIZATION  2007; 2012   "unsuccessful attempt to put stents in in 2007, had MI, had to have CABG;"  . CAROTID ENDARTERECTOMY Left 1992; 2006  . CHOLECYSTECTOMY  ~ 1996  . COLON SURGERY  2007   herniated intestinal obstruction  . COLONOSCOPY WITH PROPOFOL N/A 12/20/2016   Procedure: COLONOSCOPY WITH PROPOFOL;  Surgeon: Christene Lye, MD;  Location: [facility] ENDOSCOPY;  Service: Endoscopy;  Laterality: N/A;  . CORONARY ARTERY BYPASS GRAFT  2007   "CABG X3" (06/25/2013)  . GREEN LIGHT LASER TURP (TRANSURETHRAL RESECTION OF PROSTATE N/A 07/02/2012   Procedure: GREEN LIGHT LASER TURP (TRANSURETHRAL RESECTION OF PROSTATE;  Surgeon: Ailene Rud, MD;  Location: [facility] ORS;  Service: Urology;  Laterality: N/A;  . PARTIAL COLECTOMY Right 01/09/2017   Procedure: RIGHT COLECTOMY;  Surgeon: Christene Lye, MD;  Location: [facility] ORS;  Service: General;  Laterality: Right;  . PORTACATH PLACEMENT N/A 07/20/2016   Procedure: INSERTION PORT-A-CATH;  Surgeon: Christene Lye, MD;  Location: [facility] ORS;  Service: General;  Laterality: N/A;  . SKIN GRAFT  2008   "area of colon surgery wouldn't heal; had this graft over area"  . TONSILLECTOMY    . URETHRAL MEATOPLASTY  08/2009  . URETHRAL STRICTURE DILATATION  02/2010; 09/2010  . VENTRAL HERNIA REPAIR  2008   ventral epigastric  hernia repair [Other]    SOCIAL HISTORY:  Patient currently lives at [facility] in [place]. Quit smoking 1992;  No alcohol. .  Retired Medical illustrator.  Social History   Socioeconomic History  . Marital status: Married    Spouse name: Not on file  . Number of children: 1  . Years of education: Not on file  . Highest education level: Not on file  Social Needs  . Financial resource strain: Not on file  . Food insecurity - worry: Not on file  . Food insecurity - inability: Not on file  . Transportation needs - medical: Not on file  . Transportation needs - non-medical: Not on file  Occupational History  . Occupation: Retired  Tobacco Use  . Smoking status: Former Smoker    Packs/day: 1.00    Years: 20.00    Pack years: 20.00    Types: Cigarettes    Last attempt to quit: 04/11/1989    Years since quitting: 27.9  . Smokeless tobacco: Never Used  Substance and Sexual Activity  . Alcohol use: No    Alcohol/week: 0.0 oz  . Drug use: No  . Sexual activity: No  Other Topics Concern  .  Not on file  Social History Narrative  . Not on file    FAMILY HISTORY: Family History  Problem Relation Age of Onset  . Pancreatic cancer Father   . Diabetes Brother   . Heart disease Brother   . Colon cancer Maternal Grandmother   . Parkinson's disease Mother   . Lung cancer Brother        stage 4    ALLERGIES:  is allergic to dapsone.  MEDICATIONS:  Current Outpatient Medications  Medication Sig Dispense Refill  . aspirin 81 MG chewable tablet Chew 81 mg by mouth daily. (0800)    . carvedilol (COREG) 3.125 MG tablet TAKE 1 TABLET BY MOUTH 2 (TWO) TIMES DAILY WITH MEALS. (Patient taking differently: TAKE 0.5 TABLET (1.5625 MG) BY MOUTH TWICE DAILY (0600 & 1700)) 180 tablet 1  . cholecalciferol (VITAMIN D) 1000 UNITS tablet Take 1,000 Units by mouth daily. (0600)    . donepezil (ARICEPT) 5 MG tablet Take 5 mg by mouth at bedtime. (0600)    . finasteride (PROSCAR) 5  MG tablet Take 5 mg by mouth daily. (0800)    . levETIRAcetam (KEPPRA) 1000 MG tablet Take 1,000 mg by mouth 2 (two) times daily.    . Multiple Vitamin (MULTIVITAMIN WITH MINERALS) TABS Take 1 tablet by mouth daily. (0600)    . rosuvastatin (CRESTOR) 20 MG tablet Take 20 mg by mouth daily. (0800) Reported on 06/16/2015    . tamsulosin (FLOMAX) 0.4 MG CAPS capsule Take 0.4 mg by mouth daily. (0800)    . fluconazole (DIFLUCAN) 100 MG tablet Take 1 tablet (100 mg total) by mouth daily. (Patient not taking: Reported on 03/29/2017) 7 tablet 0   No current facility-administered medications for this visit.       Marland Kitchen  PHYSICAL EXAMINATION: ECOG PERFORMANCE STATUS: 0 - Asymptomatic  Vitals:   03/29/17 1344  BP: (!) 149/66  Pulse: 62  Resp: 18  Temp: 97.8 F (36.6 C)   Filed Weights   03/29/17 1344  Weight: 154 lb (69.9 kg)     GENERAL: Well-nourished well-developed; Alert, no distress and comfortable.  He is alone. Patient is in a wheel chair.  EYES: no pallor or icterus OROPHARYNX: no ulceration; good dentition. POSITIVE for thrush.  NECK: supple, no masses felt LYMPH:  no palpable lymphadenopathy in the cervical,or inguinal regions; LN excision noted  LUNGS: clear to auscultation and  No wheeze or crackles HEART/CVS: regular rate & rhythm and no murmurs; No lower extremity edema ABDOMEN: abdomen soft, non-tender and normal bowel sounds; positive for splenomegaly.  Musculoskeletal:no cyanosis of digits and no clubbing  PSYCH: alert & oriented x 1; Flat affect. NEURO: no focal motor/sensory deficits SKIN: Skin rash completely resolved.      LABORATORY DATA:  I have reviewed the data as listed Lab Results  Component Value Date   WBC 4.7 03/29/2017   HGB 11.7 (L) 03/29/2017   HCT 36.4 (L) 03/29/2017   MCV 78.9 (L) 03/29/2017   PLT 147 (L) 03/29/2017   Recent Labs    12/09/16 0815  01/10/17 0412 01/24/17 1216 03/29/17 1335  NA 137  --  135 138 137  K 3.9  --  4.2 3.6  3.9  CL 103  --  105 99* 103  CO2 29  --  _0 GLUCOSE 189*  --  205* 77 149*  BUN 13  --  _1 CREATININE 0.74   < > 0.76 0.74 0.77  CALCIUM 8.8*  --  8.3* 8.8* 8.5*  GFRNONAA >60   < > >60 >60 >60  GFRAA >60   < > >60 >60 >60  PROT 5.2*  --   --  6.2* 5.2*  ALBUMIN 3.6  --   --  3.8 3.4*  AST 22  --   --  17 22  ALT 16*  --   --  12* 17  ALKPHOS 99  --   --  121 99  BILITOT 1.1  --   --  1.4* 1.2   < > = values in this interval not displayed.     IMPRESSION: 1. Marked improvement and in most regions complete resolution of the prior adenopathy in the neck, chest, abdomen, and pelvis. Most of these regions are [place] 1. A subcarinal lymph node is De[place] 3 but is now normal in size. 2. Hypermetabolic activity in the cecum is present. Whereas on the prior exam this may have resembled physiologic activity, on today' s follow up PET this exact region appears to persist, and there is some increasing conspicuity of wall thickening along the cecum raising concern for colon cancer. Colonoscopy with special attention to the cecum is recommended. 3. Scattered lung nodules and regions of what appear to be rounded atelectasis in both lungs, mostly new compared to the prior exam, some of the nodularity might be attributable to atypical infectious process. I am skeptical that this is due to malignancy given the very minimal metabolic activity, but surveillance is likely warranted. 4. Mild splenomegaly but the hypermetabolic activity diffusely in the spleen on the prior exam has resolved. 5. Aortic Atherosclerosis (ICD10-I70.0). Coronary atherosclerosis with mild cardiomegaly. 6. Pleural calcifications on the right, with small right pleural effusion. 7.  Prominent stool throughout the colon favors constipation. 8. New superior endplate compression fracture at L4, likely benign.   Electronically Signed   By: Van Clines M.D.   On: 10/20/2016  10:09 ------------------------------------------------------------------------------------------   IMPRESSION: 1. Interval partial right hemicolectomy. Activity along the anastomotic staple line is thought to be postoperative rather than due to local malignancy. 2. There are several very faint hypodensities posteriorly in the right hepatic lobe that seen to correspond to very subtle areas of accentuated metabolic activity. In this setting this must be considered worrisome for the possibility of early hepatic metastatic disease. This could be confirmed with hepatic protocol MRI with and without contrast. 3. There is a new multilobular 1.9 by 1.0 cm hypermetabolic left upper lobe pulmonary nodule. Although conceivably a granulomatous nodule, this certainly could represent a metastatic lesion. 4. Reduced size and metabolic activity of the right upper lobe pulmonary nodule, which could reflect interval effective therapy versus reduced activity in an inflammatory nodule. Current maximum SUV of this nodule is 0.9. 5. Mildly hypermetabolic small right hilar lymph node, maximum SUV 4.9 (previously 3.4). While not entirely specific this could be a manifestation of the patient's prior lymphoma. 6. Mild splenomegaly without accentuated metabolic activity. 7. Aortic Atherosclerosis (ICD10-I70.0). Coronary atherosclerosis. Cardiomegaly. 8. Rounded atelectasis in the left lower lobe with adjacent pleural thickening.   Electronically Signed   By: Van Clines M.D.   On: 02/28/2017 16:23   RADIOGRAPHIC STUDIES: I have personally reviewed the radiological images as listed and agreed with the findings in the report. Nm Pet Image Restag (ps) Skull Base To Thigh  Result Date: 02/28/2017 CLINICAL DATA:  Subsequent treatment strategy for right colon cancer. History of large cell lymphoma and lung nodules. EXAM: NUCLEAR MEDICINE PET SKULL BASE TO THIGH TECHNIQUE:  12.8 mCi F-18 FDG was  injected intravenously. Full-ring PET imaging was performed from the skull base to thigh after the radiotracer. CT data was obtained and used for attenuation correction and anatomic localization. FASTING BLOOD GLUCOSE:  Value: 107 mg/dl COMPARISON:  Multiple exams, including 10/20/2016 FINDINGS: NECK No hypermetabolic lymph nodes in the neck. CHEST A new multilobular 1.9 by 1.0 cm left upper lobe pulmonary nodule on image 79/3 has a maximum SUV of 4.6. Reduced size of the right upper lobe pulmonary nodule from prior 1.8 by 1.0 to current 1.8 by 0.6 cm on image 78/3, maximum SUV 0.9, previously 2.0. No well-defined CT correlate for a focus of right hilar metabolic activity likely representing a right hilar lymph node, maximum SUV 4.9 today and previously 3.4 in this vicinity. Probable rounded atelectasis in the left lower lobe remains without significant accentuated metabolic activity. Coronary, aortic arch, and branch vessel atherosclerotic vascular disease. Prior CABG. Pacer/defibrillator device noted. Prior right pleural effusion has resolved. Right Port-A-Cath tip: SVC. Old granulomatous disease noted. There is some scattered very faint nodularity in the lungs, most notably the right lung. Mild-to-moderate cardiomegaly notably involving the left ventricle. Chronic pleural thickening posteriorly along the left lower lobe ABDOMEN/PELVIS Postoperative findings in the right colon, with accentuated metabolic activity along the staple line with maximum SUV 7.8 which could well be inflammatory. The original cecal mass had a maximum SUV of 11.5. Posteriorly in the right hepatic lobe, for example on image 123 of series 3 and on image 133 of series 3, there is some faint accentuated hypodensity which seems to have very subtle accentuated metabolic activity, barely above that of the surrounding liver. For example, in the vicinity of the subcapsular lesion on image 123/3, maximum SUV is 3.2 whereas in the center of the liver  maximum SUV of a normal region is 2.4. Aortoiliac atherosclerotic vascular disease. Aortobifemoral graft. The spleen measures 10.5 by 6.3 by 13.7 cm (volume = 470 cm^3) and demonstrates no accentuated metabolic activity. SKELETON Old right anterior and old right lateral rib fractures, healed. Reduced activity at the L4 superior endplate compression fracture. No findings of osseous metastatic disease. IMPRESSION: 1. Interval partial right hemicolectomy. Activity along the anastomotic staple line is thought to be postoperative rather than due to local malignancy. 2. There are several very faint hypodensities posteriorly in the right hepatic lobe that seen to correspond to very subtle areas of accentuated metabolic activity. In this setting this must be considered worrisome for the possibility of early hepatic metastatic disease. This could be confirmed with hepatic protocol MRI with and without contrast. 3. There is a new multilobular 1.9 by 1.0 cm hypermetabolic left upper lobe pulmonary nodule. Although conceivably a granulomatous nodule, this certainly could represent a metastatic lesion. 4. Reduced size and metabolic activity of the right upper lobe pulmonary nodule, which could reflect interval effective therapy versus reduced activity in an inflammatory nodule. Current maximum SUV of this nodule is 0.9. 5. Mildly hypermetabolic small right hilar lymph node, maximum SUV 4.9 (previously 3.4). While not entirely specific this could be a manifestation of the patient's prior lymphoma. 6. Mild splenomegaly without accentuated metabolic activity. 7. Aortic Atherosclerosis (ICD10-I70.0). Coronary atherosclerosis. Cardiomegaly. 8. Rounded atelectasis in the left lower lobe with adjacent pleural thickening. Electronically Signed   By: Van Clines M.D.   On: 02/28/2017 16:23    ASSESSMENT & PLAN:   Carcinoma of cecum ([place]) # Colon cancer stage III- p T3 N2/adenocarcinoma; MMR stable.  No adjuvant therapy given  his dementia/poor performance status.  December 2018 PET scan- no obvious recurrence noted; however subtle lesions noted in the liver [see discussion below]  # Very subtle lesions noted in the liver-unclear etiology; patient cannot have MRI because of pacemaker.  Given his poor performance status-I think it is reasonable to hold any further imaging at this time.  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 [? Transformed lymphoma].  December 2018 PET scan shows significant improvement with regards to his prior lymphoma except for mild uptake in the right hilum.  # New left upper lobe lesion~10 mm; improving right upper lobe lesion; LL rounded atelectasis- ? Etiology- monitor for now.   # A. Fib-aroxysmal/rate controlled- on aspirin only.  # Dementia- progressively worse; follow up with Neurology.   # Seizures- on keppra stable.   # port flush in 2 months/ labs-MD in 4 months; PET scan.   # 25 minutes face-to-face with the patient discussing the above plan of care; more than 50% of time spent on prognosis/ natural history; counseling and coordination.     Cammie Sickle, MD 03/29/2017 2:21 PM

## 2017-03-29 NOTE — Assessment & Plan Note (Addendum)
#  Colon cancer stage III- p T3 N2/adenocarcinoma; MMR stable.  No adjuvant therapy given his dementia/poor performance status.  December 2018 PET scan- no obvious recurrence noted; however subtle lesions noted in the liver [see discussion below]  # Very subtle lesions noted in the liver-unclear etiology; patient cannot have MRI because of pacemaker.  Given his poor performance status-I think it is reasonable to hold any further imaging at this time.  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 [? Transformed lymphoma].  December 2018 PET scan shows significant improvement with regards to his prior lymphoma except for mild uptake in the right hilum.  # New left upper lobe lesion~10 mm; improving right upper lobe lesion; LL rounded atelectasis- ? Etiology- monitor for now.   # A. Fib-aroxysmal/rate controlled- on aspirin only.  # Dementia- progressively worse; follow up with Neurology.   # Seizures- on keppra stable.   # port flush in 2 months/ labs-MD in 4 months; PET scan.   # 25 minutes face-to-face with the patient discussing the above plan of care; more than 50% of time spent on prognosis/ natural history; counseling and coordination.

## 2017-03-30 LAB — CEA: CEA: 1.6 ng/mL (ref 0.0–4.7)

## 2017-05-31 ENCOUNTER — Inpatient Hospital Stay: Payer: Medicare Other | Attending: Internal Medicine

## 2017-05-31 DIAGNOSIS — C180 Malignant neoplasm of cecum: Secondary | ICD-10-CM | POA: Insufficient documentation

## 2017-05-31 DIAGNOSIS — Z452 Encounter for adjustment and management of vascular access device: Secondary | ICD-10-CM | POA: Diagnosis not present

## 2017-05-31 DIAGNOSIS — C8334 Diffuse large B-cell lymphoma, lymph nodes of axilla and upper limb: Secondary | ICD-10-CM | POA: Diagnosis not present

## 2017-05-31 DIAGNOSIS — Z95828 Presence of other vascular implants and grafts: Secondary | ICD-10-CM

## 2017-05-31 DIAGNOSIS — Z9049 Acquired absence of other specified parts of digestive tract: Secondary | ICD-10-CM | POA: Diagnosis not present

## 2017-05-31 MED ORDER — SODIUM CHLORIDE 0.9% FLUSH
10.0000 mL | INTRAVENOUS | Status: AC | PRN
  Administered 2017-05-31: 10 mL
  Filled 2017-05-31: qty 10

## 2017-05-31 MED ORDER — HEPARIN SOD (PORK) LOCK FLUSH 100 UNIT/ML IV SOLN
500.0000 [IU] | INTRAVENOUS | Status: AC | PRN
  Administered 2017-05-31: 500 [IU]

## 2017-08-04 ENCOUNTER — Inpatient Hospital Stay: Payer: Medicare Other

## 2017-08-04 ENCOUNTER — Inpatient Hospital Stay: Payer: Medicare Other | Admitting: Internal Medicine

## 2017-08-07 ENCOUNTER — Telehealth: Payer: Self-pay | Admitting: Internal Medicine

## 2017-08-07 ENCOUNTER — Inpatient Hospital Stay: Payer: Medicare Other | Attending: Internal Medicine

## 2017-08-07 ENCOUNTER — Other Ambulatory Visit: Payer: Self-pay

## 2017-08-07 ENCOUNTER — Inpatient Hospital Stay (HOSPITAL_BASED_OUTPATIENT_CLINIC_OR_DEPARTMENT_OTHER): Payer: Medicare Other | Admitting: Internal Medicine

## 2017-08-07 VITALS — BP 143/73 | HR 68 | Temp 96.3°F | Resp 18 | Wt 135.6 lb

## 2017-08-07 DIAGNOSIS — Z79899 Other long term (current) drug therapy: Secondary | ICD-10-CM

## 2017-08-07 DIAGNOSIS — Z800 Family history of malignant neoplasm of digestive organs: Secondary | ICD-10-CM

## 2017-08-07 DIAGNOSIS — Z951 Presence of aortocoronary bypass graft: Secondary | ICD-10-CM | POA: Diagnosis not present

## 2017-08-07 DIAGNOSIS — C778 Secondary and unspecified malignant neoplasm of lymph nodes of multiple regions: Secondary | ICD-10-CM | POA: Insufficient documentation

## 2017-08-07 DIAGNOSIS — I700 Atherosclerosis of aorta: Secondary | ICD-10-CM | POA: Diagnosis not present

## 2017-08-07 DIAGNOSIS — Z9049 Acquired absence of other specified parts of digestive tract: Secondary | ICD-10-CM | POA: Insufficient documentation

## 2017-08-07 DIAGNOSIS — Z452 Encounter for adjustment and management of vascular access device: Secondary | ICD-10-CM

## 2017-08-07 DIAGNOSIS — Z7982 Long term (current) use of aspirin: Secondary | ICD-10-CM

## 2017-08-07 DIAGNOSIS — I252 Old myocardial infarction: Secondary | ICD-10-CM | POA: Insufficient documentation

## 2017-08-07 DIAGNOSIS — G40909 Epilepsy, unspecified, not intractable, without status epilepticus: Secondary | ICD-10-CM | POA: Insufficient documentation

## 2017-08-07 DIAGNOSIS — E785 Hyperlipidemia, unspecified: Secondary | ICD-10-CM

## 2017-08-07 DIAGNOSIS — Z95810 Presence of automatic (implantable) cardiac defibrillator: Secondary | ICD-10-CM

## 2017-08-07 DIAGNOSIS — K219 Gastro-esophageal reflux disease without esophagitis: Secondary | ICD-10-CM | POA: Diagnosis not present

## 2017-08-07 DIAGNOSIS — I2510 Atherosclerotic heart disease of native coronary artery without angina pectoris: Secondary | ICD-10-CM

## 2017-08-07 DIAGNOSIS — N182 Chronic kidney disease, stage 2 (mild): Secondary | ICD-10-CM | POA: Diagnosis not present

## 2017-08-07 DIAGNOSIS — I130 Hypertensive heart and chronic kidney disease with heart failure and stage 1 through stage 4 chronic kidney disease, or unspecified chronic kidney disease: Secondary | ICD-10-CM | POA: Diagnosis not present

## 2017-08-07 DIAGNOSIS — C8584 Other specified types of non-Hodgkin lymphoma, lymph nodes of axilla and upper limb: Secondary | ICD-10-CM

## 2017-08-07 DIAGNOSIS — Z950 Presence of cardiac pacemaker: Secondary | ICD-10-CM | POA: Diagnosis not present

## 2017-08-07 DIAGNOSIS — R161 Splenomegaly, not elsewhere classified: Secondary | ICD-10-CM

## 2017-08-07 DIAGNOSIS — C8334 Diffuse large B-cell lymphoma, lymph nodes of axilla and upper limb: Secondary | ICD-10-CM | POA: Insufficient documentation

## 2017-08-07 DIAGNOSIS — I5022 Chronic systolic (congestive) heart failure: Secondary | ICD-10-CM

## 2017-08-07 DIAGNOSIS — Z8673 Personal history of transient ischemic attack (TIA), and cerebral infarction without residual deficits: Secondary | ICD-10-CM

## 2017-08-07 DIAGNOSIS — Z95828 Presence of other vascular implants and grafts: Secondary | ICD-10-CM

## 2017-08-07 DIAGNOSIS — C180 Malignant neoplasm of cecum: Secondary | ICD-10-CM | POA: Diagnosis not present

## 2017-08-07 DIAGNOSIS — I4891 Unspecified atrial fibrillation: Secondary | ICD-10-CM | POA: Diagnosis not present

## 2017-08-07 DIAGNOSIS — F0390 Unspecified dementia without behavioral disturbance: Secondary | ICD-10-CM | POA: Diagnosis not present

## 2017-08-07 DIAGNOSIS — Z87891 Personal history of nicotine dependence: Secondary | ICD-10-CM

## 2017-08-07 DIAGNOSIS — Z801 Family history of malignant neoplasm of trachea, bronchus and lung: Secondary | ICD-10-CM | POA: Insufficient documentation

## 2017-08-07 DIAGNOSIS — N400 Enlarged prostate without lower urinary tract symptoms: Secondary | ICD-10-CM | POA: Insufficient documentation

## 2017-08-07 LAB — COMPREHENSIVE METABOLIC PANEL
ALK PHOS: 96 U/L (ref 38–126)
ALT: 21 U/L (ref 17–63)
ANION GAP: 9 (ref 5–15)
AST: 22 U/L (ref 15–41)
Albumin: 3.3 g/dL — ABNORMAL LOW (ref 3.5–5.0)
BILIRUBIN TOTAL: 1.2 mg/dL (ref 0.3–1.2)
BUN: 15 mg/dL (ref 6–20)
CALCIUM: 8.5 mg/dL — AB (ref 8.9–10.3)
CO2: 24 mmol/L (ref 22–32)
Chloride: 101 mmol/L (ref 101–111)
Creatinine, Ser: 0.8 mg/dL (ref 0.61–1.24)
GFR calc Af Amer: >60 mL/min (ref >60)
Glucose, Bld: 141 mg/dL — ABNORMAL HIGH (ref 65–99)
POTASSIUM: 3.9 mmol/L (ref 3.5–5.1)
Sodium: 134 mmol/L — ABNORMAL LOW (ref 135–145)
TOTAL PROTEIN: 5 g/dL — AB (ref 6.5–8.1)

## 2017-08-07 LAB — CBC WITH DIFFERENTIAL/PLATELET
Basophils Absolute: 0 10*3/uL (ref 0–0.1)
Basophils Relative: 0 %
Eosinophils Absolute: 0 10*3/uL (ref 0–0.7)
Eosinophils Relative: 0 %
HEMATOCRIT: 34.4 % — AB (ref 40.0–52.0)
HEMOGLOBIN: 11.6 g/dL — AB (ref 13.0–18.0)
LYMPHS PCT: 24 %
Lymphs Abs: 1.6 10*3/uL (ref 1.0–3.6)
MCH: 26.9 pg (ref 26.0–34.0)
MCHC: 33.7 g/dL (ref 32.0–36.0)
MCV: 79.6 fL — AB (ref 80.0–100.0)
MONO ABS: 0.5 10*3/uL (ref 0.2–1.0)
MONOS PCT: 8 %
NEUTROS ABS: 4.4 10*3/uL (ref 1.4–6.5)
NEUTROS PCT: 68 %
Platelets: 181 10*3/uL (ref 150–440)
RBC: 4.32 MIL/uL — ABNORMAL LOW (ref 4.40–5.90)
RDW: 17.1 % — AB (ref 11.5–14.5)
WBC: 6.5 10*3/uL (ref 3.8–10.6)

## 2017-08-07 LAB — LACTATE DEHYDROGENASE: LDH: 127 U/L (ref 98–192)

## 2017-08-07 MED ORDER — SODIUM CHLORIDE 0.9% FLUSH
10.0000 mL | INTRAVENOUS | Status: AC | PRN
  Administered 2017-08-07: 10 mL
  Filled 2017-08-07: qty 10

## 2017-08-07 MED ORDER — HEPARIN SOD (PORK) LOCK FLUSH 100 UNIT/ML IV SOLN
500.0000 [IU] | INTRAVENOUS | Status: AC | PRN
  Administered 2017-08-07: 500 [IU]

## 2017-08-07 NOTE — Progress Notes (Signed)
[place] NOTE  Patient Care Team: Sharyne Peach, MD as PCP - General (Family Medicine) Shela Nevin, MD as Attending Physician (Cardiology) Cammie Sickle, MD as Consulting Physician (Internal Medicine) Christene Lye, MD as Surgeon (General Surgery)  CHIEF COMPLAINTS/PURPOSE OF CONSULTATION:   Oncology History   # MARCH- [facility] 2018[facility] STAGE IV- [EBV positive; R. Ax LN; Dr.Sankar]; ? ABC subtype;POSITIVE FOR MYC GENE-RE-ARRANAGMENT.  FEB 20th 2018- PET Borderline enlarged hypermetabolic lymph nodes ] throughout the neck, chest, abdomen and pelvis, together with hypermetabolic Splenomegaly.   # April 2018- R-Pred [hx of ICMP; April 2018- EF65%]. July 2018 PET- near CR; bu ascending colon uptake  # SEP 2018- COLON CA/caecum s/p right hemi-colectomy [pT3pN2 -STAGE III; Dr.Sankar]  -------------------------------------------------------------------------------------------- # 2014- THROMBOCYTOPENIA [130s-140s] sec to splenomegaly; Feb- 2017- 107/N-Hb [hep B/C/HIV- Neg]; US-moderate splenomegaly. March 2017- BMBx- Megakaryocytosis/ otherwise no obvious dyspoiesis/ or malignancy; no increase in reticulin fibrosis; cytogenetics-n.    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  No family available/accompanied by transporter.   Gary Ferrell 82 y.o.  male Caucasian patient with moderate to severe dementia- is currently status post right hemicolectomy for his cecal cancer Stage III [no adjuvant therapy]; is also status post 6  cycles of rituximab prednisone- for his diffuse large B-cell lymphoma-is here for follow-up.  In the interim patient was evaluated at [facility] for worsening abdominal pain.  Imaging showed possible recurrence of malignancy with necrotic lymph nodes and multiple liver lesions.  However no further work-up was recommended given patient's advanced dementia/and discharged home to [facility].  Patient denies any symptoms. Denies any new lumps or bumps. He denies any difficulty swallowing. Given his advanced dementia-history is quite unreliable.  Complains of fatigue.  Complains of mild intermittent pain in his abdomen currently none.  ROS: Difficult to assess given patient's dementia.  MEDICAL HISTORY:  Past Medical History:  Diagnosis Date  . AICD (automatic cardioverter/defibrillator) present   . BPH (benign prostatic hyperplasia)    a. 06/2012 s/p Greenlight Laser Prostatectomy.  . Carotid disease, bilateral ([place])    a. 1990's s/p L CEA;  b. 11/2004 s/p redo L CEA;  c. 06/2013 Carotid U/S: RICA 27-03%, LICA patent.  . Chronic systolic CHF (congestive heart failure) ([place])    a. 04/2013 Echo: EF 35%, inf DK, inflat AK, diast dysfxn, RV HK, mild MR, nmildly dil LA.  Marland Kitchen CINV (chemotherapy-induced nausea and vomiting)   . CKD (chronic kidney disease), stage II   . Coronary artery disease    a. 2007 s/p MI & CABG.  . Dementia   . Diverticulosis    a. 10/2013 colonoscopy (otw nl study).  Marland Kitchen Dyspnea   . GERD (gastroesophageal reflux disease)   . Hyperlipidemia   . Hypertension   . Ischemic cardiomyopathy    a. 04/2013 Echo: EF 35%;  b. 06/2013 s/p SJM Bi-V ICD, ser # [id number].  . Large B-cell lymphoma ([place]) 06/16/2016  . LBBB (left bundle branch block)   . Lung nodules   . Myocardial  infarction ([place])   . OSA (obstructive sleep apnea)    a. 12/2013 Sleep study: severe OSA - CPAP-   . PAD (peripheral artery disease) ([place])    a. 1990's s/p Aortobifem bypass.  . Presence of permanent  cardiac pacemaker   . Spinal stenosis   . Stricture of urethral meatus    a. 08/2009 s/p dil.  . Stroke [facility])    a. 2007 Periop CABG - no residual.    SURGICAL HISTORY: Past Surgical History:  Procedure Laterality Date  . AORTA - BILATERAL FEMORAL ARTERY BYPASS GRAFT  1992  . APPENDECTOMY  1964  . AXILLARY LYMPH NODE BIOPSY Left 06/16/2016   LARGE B-CELL LYMPHOMA/ AXILLARY LYMPH NODE BIOPSY;  Surgeon: Christene Lye, MD;  Location: [facility] ORS;  Service: General;  Laterality: Left;  . BI-VENTRICULAR IMPLANTABLE CARDIOVERTER DEFIBRILLATOR N/A 06/25/2013   Procedure: BI-VENTRICULAR IMPLANTABLE CARDIOVERTER DEFIBRILLATOR  (CRT-D);  Surgeon: Evans Lance, MD;  Location: [facility] CATH LAB;  Service: Cardiovascular;  Laterality: N/A;  . BI-VENTRICULAR IMPLANTABLE CARDIOVERTER DEFIBRILLATOR  (CRT-D)  06/25/2013  . BI-VENTRICULAR IMPLANTABLE CARDIOVERTER DEFIBRILLATOR  (CRT-D)  06/25/13   STJ CRTD implanted by Dr Lovena Le  . CARDIAC CATHETERIZATION  2007; 2012   "unsuccessful attempt to put stents in in 2007, had MI, had to have CABG;"  . CAROTID ENDARTERECTOMY Left 1992; 2006  . CHOLECYSTECTOMY  ~ 1996  . COLON SURGERY  2007   herniated intestinal obstruction  . COLONOSCOPY WITH PROPOFOL N/A 12/20/2016   Procedure: COLONOSCOPY WITH PROPOFOL;  Surgeon: Christene Lye, MD;  Location: [facility] ENDOSCOPY;  Service: Endoscopy;  Laterality: N/A;  . CORONARY ARTERY BYPASS GRAFT  2007   "CABG X3" (06/25/2013)  . GREEN LIGHT LASER TURP (TRANSURETHRAL RESECTION OF PROSTATE N/A 07/02/2012   Procedure: GREEN LIGHT LASER TURP (TRANSURETHRAL RESECTION OF PROSTATE;  Surgeon: Ailene Rud, MD;  Location: [facility] ORS;  Service: Urology;  Laterality: N/A;  . PARTIAL COLECTOMY Right 01/09/2017   Procedure: RIGHT COLECTOMY;  Surgeon: Christene Lye, MD;  Location: [facility] ORS;  Service: General;  Laterality: Right;  . PORTACATH PLACEMENT N/A 07/20/2016   Procedure: INSERTION PORT-A-CATH;  Surgeon:  Christene Lye, MD;  Location: [facility] ORS;  Service: General;  Laterality: N/A;  . SKIN GRAFT  2008   "area of colon surgery wouldn't heal; had this graft over area"  . TONSILLECTOMY    . URETHRAL MEATOPLASTY  08/2009  . URETHRAL STRICTURE DILATATION  02/2010; 09/2010  . VENTRAL HERNIA REPAIR  2008   ventral epigastric hernia repair [Other]    SOCIAL HISTORY:  Patient currently lives at [facility] in [place]. Quit smoking 1992;  No alcohol. .  Retired Medical illustrator.  Social History   Socioeconomic History  . Marital status: Married    Spouse name: Not on file  . Number of children: 1  . Years of education: Not on file  . Highest education level: Not on file  Occupational History  . Occupation: Retired  Scientific laboratory technician  . Financial resource strain: Not on file  . Food insecurity:    Worry: Not on file    Inability: Not on file  . Transportation needs:    Medical: Not on file    Non-medical: Not on file  Tobacco Use  . Smoking status: Former Smoker    Packs/day: 1.00    Years: 20.00    Pack years: 20.00    Types: Cigarettes    Last attempt to quit: 04/11/1989    Years since  quitting: 28.3  . Smokeless tobacco: Never Used  Substance and Sexual Activity  . Alcohol use: No    Alcohol/week: 0.0 oz  . Drug use: No  . Sexual activity: Never  Lifestyle  . Physical activity:    Days per week: Not on file    Minutes per session: Not on file  . Stress: Not on file  Relationships  . Social connections:    Talks on phone: Not on file    Gets together: Not on file    Attends religious service: Not on file    Active member of club or organization: Not on file    Attends meetings of clubs or organizations: Not on file    Relationship status: Not on file  . Intimate partner violence:    Fear of current or ex partner: Not on file    Emotionally abused: Not on file    Physically abused: Not on file    Forced sexual activity: Not on file  Other Topics  Concern  . Not on file  Social History Narrative  . Not on file    FAMILY HISTORY: Family History  Problem Relation Age of Onset  . Pancreatic cancer Father   . Diabetes Brother   . Heart disease Brother   . Colon cancer Maternal Grandmother   . Parkinson's disease Mother   . Lung cancer Brother        stage 4    ALLERGIES:  is allergic to dapsone.  MEDICATIONS:  Current Outpatient Medications  Medication Sig Dispense Refill  . aspirin 81 MG chewable tablet Chew 81 mg by mouth daily. (0800)    . carvedilol (COREG) 3.125 MG tablet TAKE 1 TABLET BY MOUTH 2 (TWO) TIMES DAILY WITH MEALS. (Patient taking differently: TAKE 0.5 TABLET (1.5625 MG) BY MOUTH TWICE DAILY (0600 & 1700)) 180 tablet 1  . donepezil (ARICEPT) 5 MG tablet Take 5 mg by mouth at bedtime. (0600)    . ferrous sulfate 325 (65 FE) MG tablet Take 325 mg by mouth daily with breakfast.     . finasteride (PROSCAR) 5 MG tablet Take 5 mg by mouth daily. (0800)    . levETIRAcetam (KEPPRA) 1000 MG tablet Take 1,000 mg by mouth 2 (two) times daily.    . Multiple Vitamin (MULTIVITAMIN WITH MINERALS) TABS Take 1 tablet by mouth daily. (0600)    . pantoprazole (PROTONIX) 40 MG tablet Take by mouth.    . tamsulosin (FLOMAX) 0.4 MG CAPS capsule Take 0.4 mg by mouth daily. (0800)    . acetaminophen (TYLENOL) 325 MG tablet Take by mouth.    . cholecalciferol (VITAMIN D) 1000 UNITS tablet Take 1,000 Units by mouth daily. (0600)    . fluconazole (DIFLUCAN) 100 MG tablet Take 1 tablet (100 mg total) by mouth daily. (Patient not taking: Reported on 03/29/2017) 7 tablet 0  . rosuvastatin (CRESTOR) 20 MG tablet Take 20 mg by mouth daily. (0800) Reported on 06/16/2015     No current facility-administered medications for this visit.       Marland Kitchen  PHYSICAL EXAMINATION: ECOG PERFORMANCE STATUS: 0 - Asymptomatic  Vitals:   08/07/17 1204  BP: (!) 143/73  Pulse: 68  Resp: 18  Temp: (!) 96.3 F (35.7 C)   Filed Weights   08/07/17 1204   Weight: 135 lb 9.6 oz (61.5 kg)     GENERAL: Well-nourished well-developed; Alert, no distress and comfortable.    He is accompanied by his transportation/driver. patient is in a wheel chair.  EYES: no pallor or icterus OROPHARYNX: no ulceration; dentures.   NECK: supple, no masses felt LYMPH:  no palpable lymphadenopathy in the cervical,or inguinal regions; LN excision noted  LUNGS: clear to auscultation and  No wheeze or crackles HEART/CVS: regular rate & rhythm and no murmurs; No lower extremity edema ABDOMEN: abdomen soft, non-tender and normal bowel sounds; positive for splenomegaly.  Musculoskeletal:no cyanosis of digits and no clubbing  PSYCH: alert & oriented x 0-1; Flat affect. NEURO: no focal motor/sensory deficits SKIN: Skin rash completely resolved.      LABORATORY DATA:  I have reviewed the data as listed Lab Results  Component Value Date   WBC 6.5 08/07/2017   HGB 11.6 (L) 08/07/2017   HCT 34.4 (L) 08/07/2017   MCV 79.6 (L) 08/07/2017   PLT 181 08/07/2017   Recent Labs    01/24/17 1216 03/29/17 1335 08/07/17 1142  NA 138 137 134*  K 3.6 3.9 3.9  CL 99* 103 101  CO2 '30 26 24  ' GLUCOSE 77 149* 141*  BUN '12 12 15  ' CREATININE 0.74 0.77 0.80  CALCIUM 8.8* 8.5* 8.5*  GFRNONAA >60 >60 >60  GFRAA >60 >60 >60  PROT 6.2* 5.2* 5.0*  ALBUMIN 3.8 3.4* 3.3*  AST '17 22 22  ' ALT 12* 17 21  ALKPHOS 121 99 96  BILITOT 1.4* 1.2 1.2     IMPRESSION: 1. Marked improvement and in most regions complete resolution of the prior adenopathy in the neck, chest, abdomen, and pelvis. Most of these regions are [place] 1. A subcarinal lymph node is [place] 3 but is now normal in size. 2. Hypermetabolic activity in the cecum is present. Whereas on the prior exam this may have resembled physiologic activity, on today' s follow up PET this exact region appears to persist, and there is some increasing conspicuity of wall thickening along the cecum raising concern for colon  cancer. Colonoscopy with special attention to the cecum is recommended. 3. Scattered lung nodules and regions of what appear to be rounded atelectasis in both lungs, mostly new compared to the prior exam, some of the nodularity might be attributable to atypical infectious process. I am skeptical that this is due to malignancy given the very minimal metabolic activity, but surveillance is likely warranted. 4. Mild splenomegaly but the hypermetabolic activity diffusely in the spleen on the prior exam has resolved. 5. Aortic Atherosclerosis (ICD10-I70.0). Coronary atherosclerosis with mild cardiomegaly. 6. Pleural calcifications on the right, with small right pleural effusion. 7.  Prominent stool throughout the colon favors constipation. 8. New superior endplate compression fracture at L4, likely benign.   Electronically Signed   By: Van Clines M.D.   On: 10/20/2016 10:09 ------------------------------------------------------------------------------------------   IMPRESSION: 1. Interval partial right hemicolectomy. Activity along the anastomotic staple line is thought to be postoperative rather than due to local malignancy. 2. There are several very faint hypodensities posteriorly in the right hepatic lobe that seen to correspond to very subtle areas of accentuated metabolic activity. In this setting this must be considered worrisome for the possibility of early hepatic metastatic disease. This could be confirmed with hepatic protocol MRI with and without contrast. 3. There is a new multilobular 1.9 by 1.0 cm hypermetabolic left upper lobe pulmonary nodule. Although conceivably a granulomatous nodule, this certainly could represent a metastatic lesion. 4. Reduced size and metabolic activity of the right upper lobe pulmonary nodule, which could reflect interval effective therapy versus reduced activity in an inflammatory nodule. Current maximum SUV of this nodule  is  0.9. 5. Mildly hypermetabolic small right hilar lymph node, maximum SUV 4.9 (previously 3.4). While not entirely specific this could be a manifestation of the patient's prior lymphoma. 6. Mild splenomegaly without accentuated metabolic activity. 7. Aortic Atherosclerosis (ICD10-I70.0). Coronary atherosclerosis. Cardiomegaly. 8. Rounded atelectasis in the left lower lobe with adjacent pleural thickening.   Electronically Signed   By: Van Clines M.D.   On: 02/28/2017 16:23   RADIOGRAPHIC STUDIES: I have personally reviewed the radiological images as listed and agreed with the findings in the report. No results found.  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  No adjuvant therapy given his dementia/poor performance status.   #STAGE IV- Diffuse large B-cell lymphoma-left axillary lymph node; s/p palliative Pred-Rituxan x6.  December 2018 PET scan shows significant improvement with regards to his prior lymphoma except for mild uptake in the right hilum.  #  December 2018 PET scan- no obvious recurrence noted; however subtle lesions noted in the liver; however recent imaging at [facility] April 2019-concerns for recurrence -with multiple subtle lesions in the liver/necrotic appearing retroperitoneal lymph nodes-infection versus recurrent metastatic cancer  #Given the overall advanced dementia-I would not recommend any further work-up of the lesions noted on the CT scan with either imaging or biopsy.  Patient is not a good candidate for any further palliative therapy.  I would recommend hospice.  I left a message for the patient's son Gary Ferrell to discuss my recommendation for hospice.  I would hold the referral for hospice until I get to talk to the son.  # A. Fib-aroxysmal/rate controlled- on aspirin only.  # Dementia- progressively worse; follow up with Neurology.   # Seizures- on keppra  stable.   #Follow-up will be decided; also spoke to the patient's nurse at [facility] regarding my recommendations for hospice [again awaiting to talk to his son Gary Ferrell prior to formal referral to hospice].      Cammie Sickle, MD 08/07/2017 1:53 PM

## 2017-08-07 NOTE — Telephone Encounter (Signed)
Left message for the son [name] to call us back re: his father next plan of care. I would recommend hospice services; however await talking to pt's son prior to make hospice referral [pt currently in [facility]]. Also spoke to [place].

## 2017-08-07 NOTE — Assessment & Plan Note (Addendum)
#  Colon cancer stage III- p T3 N2/adenocarcinoma; MMR stable.  No adjuvant therapy given his dementia/poor performance status.   #STAGE IV- Diffuse large B-cell lymphoma-left axillary lymph node; s/p palliative Pred-Rituxan x6.  December 2018 PET scan shows significant improvement with regards to his prior lymphoma except for mild uptake in the right hilum.  #  December 2018 PET scan- no obvious recurrence noted; however subtle lesions noted in the liver; however recent imaging at [facility] April 2019-concerns for recurrence -with multiple subtle lesions in the liver/necrotic appearing retroperitoneal lymph nodes-infection versus recurrent metastatic cancer  #Given the overall advanced dementia-I would not recommend any further work-up of the lesions noted on the CT scan with either imaging or biopsy.  Patient is not a good candidate for any further palliative therapy.  I would recommend hospice.  I left a message for the patient's son [name] to discuss my recommendation for hospice.  I would hold the referral for hospice until I get to talk to the son.  # A. Fib-aroxysmal/rate controlled- on aspirin only.  # Dementia- progressively worse; follow up with Neurology.   # Seizures- on keppra stable.   #Follow-up will be decided; also spoke to the patient's nurse at [facility] regarding my recommendations for hospice [again awaiting to talk to his son [name] prior to formal referral to hospice].

## 2017-08-07 NOTE — Progress Notes (Signed)
Here for follow up  Here w transport staff from [facility] [employer]

## 2017-08-08 LAB — CEA: CEA1: 1.6 ng/mL (ref 0.0–4.7)

## 2017-09-09 IMAGING — NM NM CARDIA MUGA REST
4 series · 24 of 24 positions shown · non-contrast
Comparison: None

CLINICAL DATA: Large cell lymphoma of the axillary lymph nodes, pre
cardiotoxic chemotherapy

EXAM:
NUCLEAR MEDICINE CARDIAC BLOOD POOL IMAGING (MUGA)
TECHNIQUE: Cardiac multi-gated acquisition was performed at rest following
intravenous injection of Dc-99m labeled red blood cells.
RADIOPHARMACEUTICALS:  22.1 mCi Dc-99m pertechnetate in-vitro
labeled autologous red blood cells IV

[Series 1000: lao 45-gated (results) · 3.30mm/px · 6 of 24 frames shown]
[frame 3/24]
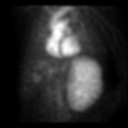
[frame 7/24]
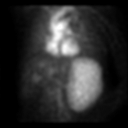
[frame 11/24]
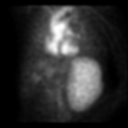
[frame 15/24]
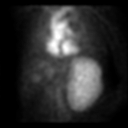
[frame 19/24]
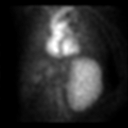
[frame 23/24]
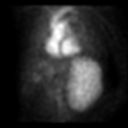

[Series 1000: lao 70-gated · 3.30mm/px · 6 of 24 frames shown]
[frame 3/24]
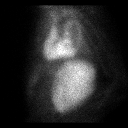
[frame 7/24]
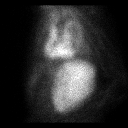
[frame 11/24]
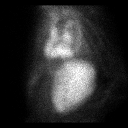
[frame 15/24]
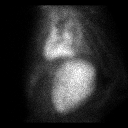
[frame 19/24]
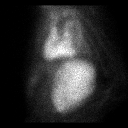
[frame 23/24]
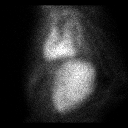

[Series 1000: lao 45-gated · 3.30mm/px · 6 of 24 frames shown]
[frame 3/24]
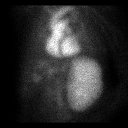
[frame 7/24]
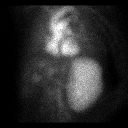
[frame 11/24]
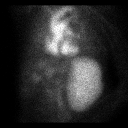
[frame 15/24]
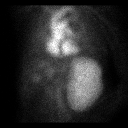
[frame 19/24]
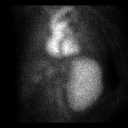
[frame 23/24]
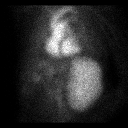

[Series 1000: ant-gated · 3.30mm/px · 6 of 24 frames shown]
[frame 3/24]
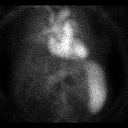
[frame 7/24]
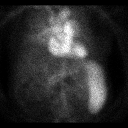
[frame 11/24]
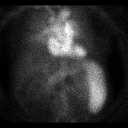
[frame 15/24]
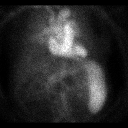
[frame 19/24]
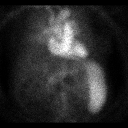
[frame 23/24]
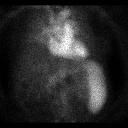

[24 of 24 positions shown; findings below may reference images not displayed]

FINDINGS: Calculated LEFT ventricular ejection fraction is 65%, within the
normal range.

Exam was performed at a cardiac rate of 68 beats per minute.

Wall motion analysis in 3 projections demonstrates normal LEFT
ventricular wall motion.
IMPRESSION: Normal LEFT ventricular ejection fraction of 65%.

Normal LV wall motion.

## 2018-08-06 ENCOUNTER — Ambulatory Visit: Payer: Medicare Other | Admitting: Internal Medicine

## 2018-08-06 ENCOUNTER — Other Ambulatory Visit: Payer: Medicare Other

## 2018-08-10 DEATH — deceased
